# Patient Record
Sex: Female | Born: 1950 | Hispanic: Yes | Marital: Single | Smoking: Former smoker
Health system: Southern US, Community
[De-identification: ages and names within clinical notes are randomized; demographics above are authoritative.]

## PROBLEM LIST (undated history)

## (undated) DIAGNOSIS — I639 Cerebral infarction, unspecified: Secondary | ICD-10-CM

## (undated) DIAGNOSIS — E119 Type 2 diabetes mellitus without complications: Secondary | ICD-10-CM

## (undated) HISTORY — PX: BREAST BIOPSY: SHX20

## (undated) HISTORY — DX: Type 2 diabetes mellitus without complications: E11.9

## (undated) HISTORY — DX: Cerebral infarction, unspecified: I63.9

---

## 2004-01-30 ENCOUNTER — Ambulatory Visit: Payer: Self-pay | Admitting: Pain Medicine

## 2004-02-05 ENCOUNTER — Ambulatory Visit: Payer: Self-pay | Admitting: Pain Medicine

## 2004-10-13 ENCOUNTER — Other Ambulatory Visit: Payer: Self-pay

## 2004-10-13 ENCOUNTER — Observation Stay: Payer: Self-pay | Admitting: Internal Medicine

## 2005-04-22 ENCOUNTER — Ambulatory Visit: Payer: Self-pay | Admitting: Family Medicine

## 2005-04-29 ENCOUNTER — Ambulatory Visit: Payer: Self-pay | Admitting: Family Medicine

## 2005-12-21 ENCOUNTER — Emergency Department: Payer: Self-pay

## 2006-05-18 ENCOUNTER — Ambulatory Visit: Payer: Self-pay

## 2008-04-03 ENCOUNTER — Ambulatory Visit: Payer: Self-pay | Admitting: Pain Medicine

## 2015-05-27 ENCOUNTER — Ambulatory Visit
Admission: RE | Admit: 2015-05-27 | Discharge: 2015-05-27 | Disposition: A | Discharge status: Home / Self Care | Payer: Medicare Other | Source: Ambulatory Visit | Attending: Family Medicine | Admitting: Family Medicine

## 2015-05-27 ENCOUNTER — Other Ambulatory Visit: Payer: Self-pay | Admitting: Family Medicine

## 2015-05-27 DIAGNOSIS — R52 Pain, unspecified: Secondary | ICD-10-CM | POA: Diagnosis present

## 2015-05-27 DIAGNOSIS — M79604 Pain in right leg: Secondary | ICD-10-CM

## 2015-05-27 DIAGNOSIS — M79605 Pain in left leg: Principal | ICD-10-CM

## 2016-08-06 ENCOUNTER — Other Ambulatory Visit: Payer: Self-pay | Admitting: Family Medicine

## 2016-08-06 DIAGNOSIS — Z1231 Encounter for screening mammogram for malignant neoplasm of breast: Secondary | ICD-10-CM

## 2016-09-06 ENCOUNTER — Ambulatory Visit: Payer: Medicare Other | Admitting: Nurse Practitioner

## 2016-09-16 ENCOUNTER — Ambulatory Visit: Payer: Medicare Other | Admitting: Nurse Practitioner

## 2016-09-30 ENCOUNTER — Ambulatory Visit: Payer: Medicare Other | Attending: Nurse Practitioner | Admitting: Nurse Practitioner

## 2016-09-30 ENCOUNTER — Encounter: Payer: Self-pay | Admitting: Nurse Practitioner

## 2016-09-30 DIAGNOSIS — M79605 Pain in left leg: Secondary | ICD-10-CM | POA: Insufficient documentation

## 2016-09-30 DIAGNOSIS — Z8249 Family history of ischemic heart disease and other diseases of the circulatory system: Secondary | ICD-10-CM | POA: Insufficient documentation

## 2016-09-30 DIAGNOSIS — Z8261 Family history of arthritis: Secondary | ICD-10-CM | POA: Insufficient documentation

## 2016-09-30 DIAGNOSIS — M533 Sacrococcygeal disorders, not elsewhere classified: Secondary | ICD-10-CM | POA: Diagnosis not present

## 2016-09-30 DIAGNOSIS — E119 Type 2 diabetes mellitus without complications: Secondary | ICD-10-CM | POA: Diagnosis not present

## 2016-09-30 DIAGNOSIS — M79606 Pain in leg, unspecified: Secondary | ICD-10-CM | POA: Insufficient documentation

## 2016-09-30 DIAGNOSIS — Z823 Family history of stroke: Secondary | ICD-10-CM | POA: Diagnosis not present

## 2016-09-30 DIAGNOSIS — Z833 Family history of diabetes mellitus: Secondary | ICD-10-CM | POA: Diagnosis not present

## 2016-09-30 DIAGNOSIS — M79604 Pain in right leg: Secondary | ICD-10-CM | POA: Diagnosis present

## 2016-09-30 DIAGNOSIS — Z79891 Long term (current) use of opiate analgesic: Secondary | ICD-10-CM | POA: Insufficient documentation

## 2016-09-30 DIAGNOSIS — M792 Neuralgia and neuritis, unspecified: Secondary | ICD-10-CM | POA: Diagnosis not present

## 2016-09-30 DIAGNOSIS — Z79899 Other long term (current) drug therapy: Secondary | ICD-10-CM | POA: Diagnosis not present

## 2016-09-30 DIAGNOSIS — Z87891 Personal history of nicotine dependence: Secondary | ICD-10-CM | POA: Insufficient documentation

## 2016-09-30 DIAGNOSIS — F1190 Opioid use, unspecified, uncomplicated: Secondary | ICD-10-CM | POA: Insufficient documentation

## 2016-09-30 DIAGNOSIS — M545 Low back pain, unspecified: Secondary | ICD-10-CM | POA: Insufficient documentation

## 2016-09-30 DIAGNOSIS — Z7984 Long term (current) use of oral hypoglycemic drugs: Secondary | ICD-10-CM | POA: Diagnosis not present

## 2016-09-30 DIAGNOSIS — G8929 Other chronic pain: Secondary | ICD-10-CM | POA: Diagnosis not present

## 2016-09-30 DIAGNOSIS — G894 Chronic pain syndrome: Secondary | ICD-10-CM | POA: Diagnosis not present

## 2016-09-30 DIAGNOSIS — M542 Cervicalgia: Secondary | ICD-10-CM | POA: Insufficient documentation

## 2016-09-30 MED ORDER — AMITRIPTYLINE HCL 25 MG PO TABS: ORAL_TABLET | ORAL | 0 refills | Status: DC

## 2016-09-30 NOTE — Patient Instructions (Addendum)
____________________________________________________________________________________________  Appointment Policy Summary  It is our goal and responsibility to provide the medical community with assistance in the evaluation and management of patients with chronic pain. Unfortunately our resources are limited. Because we do not have an unlimited amount of time, or available appointments, we are required to closely monitor and manage their use. The following rules exist to maximize their use:  Patient's responsibilities: 1. Punctuality:  At what time should I arrive? You should be physically present in our office 30 minutes before your scheduled appointment. Your scheduled appointment is with your assigned healthcare provider. However, it takes 5-10 minutes to be "checked-in", and another 15 minutes for the nurses to do the admission. If you arrive to our office at the time you were given for your appointment, you will end up being at least 20-25 minutes late to your appointment with the provider. 2. Tardiness:  What happens if I arrive only a few minutes after my scheduled appointment time? You will need to reschedule your appointment. The cutoff is your appointment time. This is why it is so important that you arrive at least 30 minutes before that appointment. If you have an appointment scheduled for 10:00 AM and you arrive at 10:01, you will be required to reschedule your appointment.  3. Plan ahead:  Always assume that you will encounter traffic on your way in. Plan for it. If you are dependent on a driver, make sure they understand these rules and the need to arrive early. 4. Other appointments and responsibilities:  Avoid scheduling any other appointments before or after your pain clinic appointments.  5. Be prepared:  Write down everything that you need to discuss with your healthcare provider and give this information to the admitting nurse. Write down the medications that you will need  refilled. Bring your pills and bottles (even the empty ones), to all of your appointments, except for those where a procedure is scheduled. 6. No children or pets:  Find someone to take care of them. It is not appropriate to bring them in. 7. Scheduling changes:  We request "advanced notification" of any changes or cancellations. 8. Advanced notification:  Defined as a time period of more than 24 hours prior to the originally scheduled appointment. This allows for the appointment to be offered to other patients. 9. Rescheduling:  When a visit is rescheduled, it will require the cancellation of the original appointment. For this reason they both fall within the category of "Cancellations".  10. Cancellations:  They require advanced notification. Any cancellation less than 24 hours before the  appointment will be recorded as a "No Show". 11. No Show:  Defined as an unkept appointment where the patient failed to notify or declare to the practice their intention or inability to keep the appointment.  Corrective process for repeat offenders:  1. Tardiness: Three (3) episodes of rescheduling due to late arrivals will be recorded as one (1) "No Show". 2. Cancellation or reschedule: Three (3) cancellations or rescheduling will be recorded as one (1) "No Show". 3. "No Shows": Three (3) "No Shows" within a 12 month period will result in discharge from the practice.  ____________________________________________________________________________________________  ____________________________________________________________________________________________  Pain Scale  Introduction: The pain score used by this practice is the Verbal Numerical Rating Scale (VNRS-11). This is an 11-point scale. It is for adults and children 10 years or older. There are significant differences in how the pain score is reported, used, and applied. Forget everything you learned in the past and  learn this scoring  system.  General Information: The scale should reflect your current level of pain. Unless you are specifically asked for the level of your worst pain, or your average pain. If you are asked for one of these two, then it should be understood that it is over the past 24 hours.  Basic Activities of Daily Living (ADL): Personal hygiene, dressing, eating, transferring, and using restroom.  Instructions: Most patients tend to report their level of pain as a combination of two factors, their physical pain and their psychosocial pain. This last one is also known as "suffering" and it is reflection of how physical pain affects you socially and psychologically. From now on, report them separately. From this point on, when asked to report your pain level, report only your physical pain. Use the following table for reference.  Pain Clinic Pain Levels (0-5/10)  Pain Level Score  Description  No Pain 0   Mild pain 1 Nagging, annoying, but does not interfere with basic activities of daily living (ADL). Patients are able to eat, bathe, get dressed, toileting (being able to get on and off the toilet and perform personal hygiene functions), transfer (move in and out of bed or a chair without assistance), and maintain continence (able to control bladder and bowel functions). Blood pressure and heart rate are unaffected. A normal heart rate for a healthy adult ranges from 60 to 100 bpm (beats per minute).   Mild to moderate pain 2 Noticeable and distracting. Impossible to hide from other people. More frequent flare-ups. Still possible to adapt and function close to normal. It can be very annoying and may have occasional stronger flare-ups. With discipline, patients may get used to it and adapt.   Moderate pain 3 Interferes significantly with activities of daily living (ADL). It becomes difficult to feed, bathe, get dressed, get on and off the toilet or to perform personal hygiene functions. Difficult to get in and out of  bed or a chair without assistance. Very distracting. With effort, it can be ignored when deeply involved in activities.   Moderately severe pain 4 Impossible to ignore for more than a few minutes. With effort, patients may still be able to manage work or participate in some social activities. Very difficult to concentrate. Signs of autonomic nervous system discharge are evident: dilated pupils (mydriasis); mild sweating (diaphoresis); sleep interference. Heart rate becomes elevated (>115 bpm). Diastolic blood pressure (lower number) rises above 100 mmHg. Patients find relief in laying down and not moving.   Severe pain 5 Intense and extremely unpleasant. Associated with frowning face and frequent crying. Pain overwhelms the senses.  Ability to do any activity or maintain social relationships becomes significantly limited. Conversation becomes difficult. Pacing back and forth is common, as getting into a comfortable position is nearly impossible. Pain wakes you up from deep sleep. Physical signs will be obvious: pupillary dilation; increased sweating; goosebumps; brisk reflexes; cold, clammy hands and feet; nausea, vomiting or dry heaves; loss of appetite; significant sleep disturbance with inability to fall asleep or to remain asleep. When persistent, significant weight loss is observed due to the complete loss of appetite and sleep deprivation.  Blood pressure and heart rate becomes significantly elevated. Caution: If elevated blood pressure triggers a pounding headache associated with blurred vision, then the patient should immediately seek attention at an urgent or emergency care unit, as these may be signs of an impending stroke.    Emergency Department Pain Levels (6-10/10)  Emergency Room Pain 6   Severely limiting. Requires emergency care and should not be seen or managed at an outpatient pain management facility. Communication becomes difficult and requires great effort. Assistance to reach the  emergency department may be required. Facial flushing and profuse sweating along with potentially dangerous increases in heart rate and blood pressure will be evident.   Distressing pain 7 Self-care is very difficult. Assistance is required to transport, or use restroom. Assistance to reach the emergency department will be required. Tasks requiring coordination, such as bathing and getting dressed become very difficult.   Disabling pain 8 Self-care is no longer possible. At this level, pain is disabling. The individual is unable to do even the most "basic" activities such as walking, eating, bathing, dressing, transferring to a bed, or toileting. Fine motor skills are lost. It is difficult to think clearly.   Incapacitating pain 9 Pain becomes incapacitating. Thought processing is no longer possible. Difficult to remember your own name. Control of movement and coordination are lost.   The worst pain imaginable 10 At this level, most patients pass out from pain. When this level is reached, collapse of the autonomic nervous system occurs, leading to a sudden drop in blood pressure and heart rate. This in turn results in a temporary and dramatic drop in blood flow to the brain, leading to a loss of consciousness. Fainting is one of the body's self defense mechanisms. Passing out puts the brain in a calmed state and causes it to shut down for a while, in order to begin the healing process.    Summary: 1. Refer to this scale when providing us with your pain level. 2. Be accurate and careful when reporting your pain level. This will help with your care. 3. Over-reporting your pain level will lead to loss of credibility. 4. Even a level of 1/10 means that there is pain and will be treated at our facility. 5. High, inaccurate reporting will be documented as "Symptom Exaggeration", leading to loss of credibility and suspicions of possible secondary gains such as obtaining more narcotics, or wanting to appear  disabled, for fraudulent reasons. 6. Only pain levels of 5 or below will be seen at our facility. 7. Pain levels of 6 and above will be sent to the Emergency Department and the appointment cancelled. ______________________________________________  Please get your x-rays done as soon as possible.______________________________________________  You were given one prescription for Amitriptyline today.

## 2016-09-30 NOTE — Progress Notes (Signed)
Patient's Name: Meredith Martinez  MRN: [id number]  Referring Provider: Herminio Commons, MD  DOB: 1950-12-28  PCP: Meredith Commons, MD  DOS: 09/30/2016  Note by: Meredith David NP  Service setting: Ambulatory outpatient  Specialty: Interventional Pain Management  Location: [facility] (AMB) Pain Management Facility    Patient type: New Patient    Primary Reason(s) for Visit: Initial Patient Evaluation CC: Leg Pain (both, worse in left leg)  HPI  [name] is a 66 y.o. year old, female patient, who comes today for an initial evaluation. She has Chronic Lower extremity pain  (Secondary Area of Pain) (bilateral) (L>R); Chronic pain syndrome; Chronic neck pain(Primary Area of Pain) (Bilateral) (L>R); Long term current use of opiate analgesic; Long term prescription opiate use; Opiate use; Chronic midline low back pain without sciatica [facility] Area of Pain) (L>R); Neurogenic pain; and Sacroiliac joint pain on her problem list.. Her primarily concern today is the Leg Pain (both, worse in left leg)  Pain Assessment: Location: Right, Left Leg Radiating: n/a Onset: More than a month ago Duration: Chronic pain, Neuropathic pain Quality: Burning, Throbbing (itching) Severity: 7 /10 (self-reported pain score)  Note: Reported level is compatible with observation.                   Effect on ADL:   Timing: Constant Modifying factors: medications  Onset and Duration: Gradual Cause of pain: neuropathy Severity: Getting worse, NAS-11 at its worse: 10/10, NAS-11 at its best: 10/10, NAS-11 now: 7/10 and NAS-11 on the average: 8/10 Timing: Night, During activity or exercise and After a period of immobility Aggravating Factors: Bending, Climbing, Kneeling, Lifiting, Motion, Prolonged standing, Squatting, Stooping , Walking, Walking uphill and Walking downhill Alleviating Factors: Cold packs, Lying down, Medications and Warm showers or baths Associated Problems: Day-time cramps, Night-time cramps,  Fatigue, Numbness, Personality changes, Sadness, Sweating, Swelling, Tingling, Pain that wakes patient up and Pain that does not allow patient to sleep Quality of Pain: Aching, Agonizing, Annoying, Burning, Intermittent, Cruel, Deep, Hot, Itching, Sharp, Shooting, Stabbing, Tender, Throbbing, Tingling and Uncomfortable Previous Examinations or Tests: Biopsy Previous Treatments: Narcotic medications  The patient comes into the clinics today for the first time for a chronic pain management evaluation. According to the patient primary area of pain is in her neck. She admits that this pain is secondary to a fall. She denies any previous surgeries. She did have interventional therapies in the past. She states that they were nerve blocks approximately 2001 and 2. She did not feel they were effective. She did see Dr. Birdie Martinez chiropractor in 2017. She does not feel that this was effective.  Her second area of pain is in her lower extremities. She admits that the left is greater than the right. She describes it as a sharp burning pain. She is having swelling in her lower extremities. She denies previous EMG. She does have uncontrolled diabetes mellitus. She has been encouraged to start insulin therapy however she does not want to do that at this time. She has been checking for ketoacidosis in the past. She has failed gabapentin secondary to allergic reaction (rash). She did not feel that Lyrica was effective however maximum dose was 50 mg 3 times daily.  Her third area of pain is in her lower back. She denies any previous surgeries, interventional therapy or physical therapy..  Today I took the time to provide the patient with information regarding this pain practice. The patient was informed that the practice is divided into two  sections: an interventional pain management section, as well as a completely separate and distinct medication management section. I explained that there are procedure days for  interventional therapies, and evaluation days for follow-ups and medication management. Because of the amount of documentation required during both, they are kept separated. This means that there is the possibility that she may be scheduled for a procedure on one day, and medication management the next. I have also informed her that because of staffing and facility limitations, this practice will no longer take patients for medication management only. To illustrate the reasons for this, I gave the patient the example of surgeons, and how inappropriate it would be to refer a patient to her care, just to write for the post-surgical antibiotics on a surgery done by a different surgeon.   Because interventional pain management is part of the board-certified specialty for the doctors, the patient was informed that joining this practice means that they are open to any and all interventional therapies. I made it clear that this does not mean that they will be forced to have any procedures done. What this means is that I believe interventional therapies to be essential part of the diagnosis and proper management of chronic pain conditions. Therefore, patients not interested in these interventional alternatives will be better served under the care of a different practitioner.  The patient was also made aware of my Comprehensive Pain Management Safety Guidelines where by joining this practice, they limit all of their nerve blocks and joint injections to those done by our practice, for as long as we are retained to manage their care. Historic Controlled Substance Pharmacotherapy Review  PMP and historical list of controlled substances: Lyrica 50 mg, acetaminophen/codeine No. 3, hydrocodone/acetaminophen 5/325 mg, Cheratussin AC, hydrocodone/acetaminophen 5/325 Highest opioid analgesic regimen found: Hydrocodone/acetaminophen 5/325 2 tablets 4 times daily (fill date 01/19/2019) (hydrocodone 40 mg per day) Most recent  opioid analgesic: None Current opioid analgesics: None Highest recorded MME/day: 40 mg/day MME/day: 0 mg/day Medications: The patient did not bring the medication(s) to the appointment, as requested in our "New Patient Package" Pharmacodynamics: Desired effects: Analgesia: The patient reports >50% benefit. Reported improvement in function: The patient reports medication allows her to accomplish basic ADLs. Clinically meaningful improvement in function (CMIF): Sustained CMIF goals met Perceived effectiveness: Described as relatively effective, allowing for increase in activities of daily living (ADL) Undesirable effects: Side-effects or Adverse reactions: None reported Historical Monitoring: The patient  reports that she does not use drugs. List of all UDS Test(s): No results found for: MDMA, COCAINSCRNUR, PCPSCRNUR, PCPQUANT, CANNABQUANT, THCU, [place] List of all Serum Drug Screening Test(s):  No results found for: AMPHSCRSER, BARBSCRSER, BENZOSCRSER, COCAINSCRSER, PCPSCRSER, PCPQUANT, THCSCRSER, CANNABQUANT, OPIATESCRSER, OXYSCRSER, PROPOXSCRSER Historical Background Evaluation: [place] PDMP: Six (6) year initial data search conducted.             [place] Department of public safety, offender search: Editor, commissioning Information) Non-contributory Risk Assessment Profile: Aberrant behavior: None observed or detected today Risk factors for fatal opioid overdose: None identified today Fatal overdose hazard ratio (HR): Calculation deferred Non-fatal overdose hazard ratio (HR): Calculation deferred Risk of opioid abuse or dependence: 0.7-3.0% with doses ? 36 MME/day and 6.1-26% with doses ? 120 MME/day. Substance use disorder (SUD) risk level: Pending results of Medical Psychology Evaluation for SUD Opioid risk tool (ORT) (Total Score): 3  ORT Scoring interpretation table:  Score <3 = Low Risk for SUD  Score between 4-7 = Moderate Risk for SUD  Score >8 =  High Risk for Opioid Abuse   PHQ-2 Depression  Scale:  Total score: 0  PHQ-2 Scoring interpretation table: (Score and probability of major depressive disorder)  Score 0 = No depression  Score 1 = 15.4% Probability  Score 2 = 21.1% Probability  Score 3 = 38.4% Probability  Score 4 = 45.5% Probability  Score 5 = 56.4% Probability  Score 6 = 78.6% Probability   PHQ-9 Depression Scale:  Total score: 0  PHQ-9 Scoring interpretation table:  Score 0-4 = No depression  Score 5-9 = Mild depression  Score 10-14 = Moderate depression  Score 15-19 = Moderately severe depression  Score 20-27 = Severe depression (2.4 times higher risk of SUD and 2.89 times higher risk of overuse)   Pharmacologic Plan: Pending ordered tests and/or consults  Meds  The patient has a current medication list which includes the following prescription(s): acetaminophen, vitamin d-3, lisinopril, loratadine, metformin, montelukast, naproxen sodium, pravastatin, promethazine, vitamin b-12, and amitriptyline.  No current outpatient prescriptions on file prior to visit.   No current facility-administered medications on file prior to visit.    Imaging Review   Lumbosacral Imaging:  Lumbar DG (Complete) 4+V:  Results for orders placed during the hospital encounter of 05/27/15  DG Lumbar Spine Complete   Narrative CLINICAL DATA:  Pain from low back down through both lower extremities for over 1 year worsened over past month, no known injury, pain at low back and at BILATERAL knees, lower legs, and ankles  EXAM: LUMBAR SPINE - COMPLETE 4+ VIEW  COMPARISON:  None  FINDINGS: Osseous demineralization.  Five non-rib-bearing lumbar vertebra.  Vertebral body and disc space heights maintained.  No acute fracture, subluxation or bone destruction.  No spondylolysis.  SI joints symmetric.  IMPRESSION: Osseous demineralization.  No significant lumbar spine abnormalities.   Electronically Signed   By: Lavonia Dana M.D.   On: 05/27/2015 14:24   Note:  Available results from prior imaging studies were reviewed.        ROS  Cardiovascular History: Needs antibiotics prior to dental procedures Pulmonary or Respiratory History: No reported pulmonary signs or symptoms such as wheezing and difficulty taking a deep full breath (Asthma), difficulty blowing air out (Emphysema), coughing up mucus (Bronchitis), persistent dry cough, or temporary stoppage of breathing during sleep Neurological History: No reported neurological signs or symptoms such as seizures, abnormal skin sensations, urinary and/or fecal incontinence, being born with an abnormal open spine and/or a tethered spinal cord Review of Past Neurological Studies: No results found for this or any previous visit. Psychological-Psychiatric History: No reported psychological or psychiatric signs or symptoms such as difficulty sleeping, anxiety, depression, delusions or hallucinations (schizophrenial), mood swings (bipolar disorders) or suicidal ideations or attempts Gastrointestinal History: No reported gastrointestinal signs or symptoms such as vomiting or evacuating blood, reflux, heartburn, alternating episodes of diarrhea and constipation, inflamed or scarred liver, or pancreas or irrregular and/or infrequent bowel movements Genitourinary History: Difficulty emptying the bladder or controlling the flow of urine (Neurogenic bladder) Hematological History: Weakness due to low blood hemoglobin or red blood cell count (Anemia) Endocrine History: High blood sugar controlled without the use of insulin (NIDDM) Rheumatologic History: No reported rheumatological signs and symptoms such as fatigue, joint pain, tenderness, swelling, redness, heat, stiffness, decreased range of motion, with or without associated rash Musculoskeletal History: Negative for myasthenia gravis, muscular dystrophy, multiple sclerosis or malignant hyperthermia Work History: Disabled  Allergies  Ms. Strader has no allergies on  file.  Laboratory Chemistry  Inflammation  Markers Lab Results  Component Value Date   CRP 5.0 (H) 09/30/2016   ESRSEDRATE 13 09/30/2016   (CRP: Acute Phase) (ESR: Chronic Phase) Renal Function Markers Lab Results  Component Value Date   BUN 10 09/30/2016   CREATININE 0.72 09/30/2016   GFRAA 103 09/30/2016   GFRNONAA 89 09/30/2016   Hepatic Function Markers Lab Results  Component Value Date   AST 18 09/30/2016   ALT 20 09/30/2016   ALBUMIN 4.7 09/30/2016   ALKPHOS 118 (H) 09/30/2016   Electrolytes Lab Results  Component Value Date   NA 136 09/30/2016   K 4.3 09/30/2016   CL 96 09/30/2016   CALCIUM 10.3 09/30/2016   MG 2.1 09/30/2016   Neuropathy Markers Lab Results  Component Value Date   VITAMINB12 556 09/30/2016   Bone Pathology Markers Lab Results  Component Value Date   ALKPHOS 118 (H) 09/30/2016   25OHVITD1 WILL FOLLOW 09/30/2016   25OHVITD2 WILL FOLLOW 09/30/2016   25OHVITD3 WILL FOLLOW 09/30/2016   CALCIUM 10.3 09/30/2016   Coagulation Parameters No results found for: INR, LABPROT, APTT, PLT Cardiovascular Markers No results found for: BNP, HGB, HCT Note: Lab results reviewed.  [place]  Drug: Ms. Burdett  reports that she does not use drugs. Alcohol:  reports that she does not drink alcohol. Tobacco:  reports that she quit smoking about 10 years ago. She has never used smokeless tobacco. Medical:  has a past medical history of Diabetes mellitus without complication ([place]). Family: family history includes Arthritis in her mother; Cancer in her mother; Diabetes in her father and mother; Heart disease in her father and mother; Stroke in her father.  No past surgical history on file. Active Ambulatory Problems    Diagnosis Date Noted  . Chronic Lower extremity pain  (Secondary Area of Pain) (bilateral) (L>R) 09/30/2016  . Chronic pain syndrome 09/30/2016  . Chronic neck pain(Primary Area of Pain) (Bilateral) (L>R) 09/30/2016  . Long term current use  of opiate analgesic 09/30/2016  . Long term prescription opiate use 09/30/2016  . Opiate use 09/30/2016  . Chronic midline low back pain without sciatica [facility] Area of Pain) (L>R) 09/30/2016  . Neurogenic pain 09/30/2016  . Sacroiliac joint pain 09/30/2016   Resolved Ambulatory Problems    Diagnosis Date Noted  . No Resolved Ambulatory Problems   Past Medical History:  Diagnosis Date  . Diabetes mellitus without complication ([place])    Constitutional Exam  General appearance: Well nourished, well developed, and well hydrated. In no apparent acute distress Vitals:   09/30/16 1331  BP: (!) 138/59  Pulse: 77  Resp: 16  Temp: 98.6 F (37 C)  TempSrc: Oral  SpO2: 99%  Weight: 130 lb (59 kg)  Height: _0  (1.499 m)   BMI Assessment: Estimated body mass index is 26.26 kg/m as calculated from the following:   Height as of this encounter: _1  (1.499 m).   Weight as of this encounter: 130 lb (59 kg).  BMI interpretation table: BMI level Category Range association with higher incidence of chronic pain  <18 kg/m2 Underweight   18.5-24.9 kg/m2 Ideal body weight   25-29.9 kg/m2 Overweight Increased incidence by 20%  30-34.9 kg/m2 Obese (Class I) Increased incidence by 68%  35-39.9 kg/m2 Severe obesity (Class II) Increased incidence by 136%  >40 kg/m2 Extreme obesity (Class III) Increased incidence by 254%   BMI Readings from Last 4 Encounters:  09/30/16 26.26 kg/m   Wt Readings from Last 4 Encounters:  09/30/16 130 lb (  59 kg)  Psych/Mental status: Alert, oriented x 3 (person, place, & time)       Eyes: PERLA Respiratory: No evidence of acute respiratory distress  Cervical Spine Exam  Inspection: No masses, redness, or swelling Alignment: Symmetrical Functional ROM: Unrestricted ROM      Stability: No instability detected Muscle strength & Tone: Functionally intact Sensory: Unimpaired Palpation: Complains of area being tender to palpation              Upper  Extremity (UE) Exam    Side: Right upper extremity  Side: Left upper extremity  Inspection: Atrophy contracture of right hand  Inspection: No masses, redness, swelling, or asymmetry. No contractures left trigger finger 3, 4 digit  Functional ROM: Unrestricted ROM          Functional ROM: Unrestricted ROM          Muscle strength & Tone: Functionally intact  Muscle strength & Tone: Functionally intact  Sensory: Unimpaired  Sensory: Unimpaired  Palpation: No palpable anomalies              Palpation: No palpable anomalies              Specialized Test(s): Deferred         Specialized Test(s): Deferred          Thoracic Spine Exam  Inspection: No masses, redness, or swelling Alignment: Symmetrical Functional ROM: Unrestricted ROM Stability: No instability detected Sensory: Unimpaired Muscle strength & Tone: No palpable anomalies  Lumbar Spine Exam  Inspection: No masses, redness, or swelling Alignment: Symmetrical Functional ROM: Unrestricted ROM      Stability: No instability detected Muscle strength & Tone: Functionally intact Sensory: Unimpaired Palpation: Complains of area being tender to palpation       Provocative Tests: Lumbar Hyperextension and rotation test: Negative       Patrick's Maneuver: Positive for bilateral S-I arthralgia              Gait & Posture Assessment  Ambulation: Unassisted Gait: Relatively normal for age and body habitus Posture: WNL   Lower Extremity Exam    Side: Right lower extremity  Side: Left lower extremity  Inspection: Edema  Inspection: Edema flatten erythematous rash below the knee  Functional ROM: Unrestricted ROM          Functional ROM: Unrestricted ROM          Muscle strength & Tone: Functionally intact  Muscle strength & Tone: Functionally intact  Sensory: Unimpaired  Sensory: Unimpaired  Palpation: Tender  Palpation: Tender   Assessment  Primary Diagnosis & Pertinent Problem List: Diagnoses of Chronic neck pain, Chronic midline low  back pain without sciatica, Neurogenic pain, Pain in both lower extremities, Sacroiliac joint pain, Chronic pain syndrome, and Long term prescription opiate use were pertinent to this visit.  Visit Diagnosis: 1. Chronic neck pain   2. Chronic midline low back pain without sciatica   3. Neurogenic pain   4. Pain in both lower extremities   5. Sacroiliac joint pain   6. Chronic pain syndrome   7. Long term prescription opiate use    Plan of Care  Initial treatment plan:  Please be advised that as per protocol, today's visit has been an evaluation only. We have not taken over the patient's controlled substance management.  Problem-specific plan: No problem-specific Assessment & Plan notes found for this encounter.  Ordered Lab-work, Procedure(s), Referral(s), & Consult(s): Orders Placed This Encounter  Procedures  . DG Cervical Spine Complete  .  DG Lumbar Spine Complete W/Bend  . DG Si Joints  . Compliance Drug Analysis, Ur  . C-reactive protein  . Sedimentation rate  . Comprehensive metabolic panel  . Magnesium  . 25-Hydroxyvitamin D Lcms D2+D3  . Vitamin B12  . Ambulatory referral to Psychology   Pharmacotherapy: Medications ordered:  Meds ordered this encounter  Medications  . amitriptyline (ELAVIL) 25 MG tablet    Sig: At bedtime    Dispense:  30 tablet    Refill:  0    Order Specific Question:   Supervising Provider    AnswerMilinda Pointer [phone]   Medications administered during this visit: Ms. Gundy had no medications administered during this visit.   Pharmacotherapy under consideration:  Opioid Analgesics: The patient was informed that there is no guarantee that she would be a candidate for opioid analgesics. The decision will be made following CDC guidelines. This decision will be based on the results of diagnostic studies, as well as Ms. Don's risk profile.  Membrane stabilizer: To be determined at a later time Muscle relaxant: To be  determined at a later time NSAID: To be determined at a later time Other analgesic(s): To be determined at a later time   Interventional therapies under consideration: Ms. Rath was informed that there is no guarantee that she would be a candidate for interventional therapies. The decision will be based on the results of diagnostic studies, as well as Ms. Eide's risk profile.  Possible procedure(s): Cervical spinal cord stimulator trial Diagnostic bilateral cervical epidural steroid injection Diagnostic bilateral cervical facet block Cervical radiofrequency ablation Diagnostic bilateral lumbar epidural steroid injection Diagnostic bilateral lumbar facet block Lumbar radiofrequency ablation Sacroiliac joint injection    Provider-requested follow-up: Return for 2nd Visit, w/ Dr. Dossie Arbour, after [facility] eval.  No future appointments.  Primary Care Physician: Meredith Commons, MD Location: [facility] Outpatient Pain Management Facility Note by:  Date: 09/30/2016; Time: 11:31 AM  Pain Score Disclaimer: We use the NRS-11 scale. This is a self-reported, subjective measurement of pain severity with only modest accuracy. It is used primarily to identify changes within a particular patient. It must be understood that outpatient pain scales are significantly less accurate that those used for research, where they can be applied under ideal controlled circumstances with minimal exposure to variables. In reality, the score is likely to be a combination of pain intensity and pain affect, where pain affect describes the degree of emotional arousal or changes in action readiness caused by the sensory experience of pain. Factors such as social and work situation, setting, emotional state, anxiety levels, expectation, and prior pain experience may influence pain perception and show large inter-individual differences that may also be affected by time variables.  Patient instructions provided during this  appointment: Patient Instructions    ____________________________________________________________________________________________  Appointment Policy Summary  It is our goal and responsibility to provide the medical community with assistance in the evaluation and management of patients with chronic pain. Unfortunately our resources are limited. Because we do not have an unlimited amount of time, or available appointments, we are required to closely monitor and manage their use. The following rules exist to maximize their use:  Patient's responsibilities: 1. Punctuality:  At what time should I arrive? You should be physically present in our office 30 minutes before your scheduled appointment. Your scheduled appointment is with your assigned healthcare provider. However, it takes 5-10 minutes to be "checked-in", and another 15 minutes for the nurses to do the admission. If  you arrive to our office at the time you were given for your appointment, you will end up being at least 20-25 minutes late to your appointment with the provider. 2. Tardiness:  What happens if I arrive only a few minutes after my scheduled appointment time? You will need to reschedule your appointment. The cutoff is your appointment time. This is why it is so important that you arrive at least 30 minutes before that appointment. If you have an appointment scheduled for 10:00 AM and you arrive at 10:01, you will be required to reschedule your appointment.  3. Plan ahead:  Always assume that you will encounter traffic on your way in. Plan for it. If you are dependent on a driver, make sure they understand these rules and the need to arrive early. 4. Other appointments and responsibilities:  Avoid scheduling any other appointments before or after your pain clinic appointments.  5. Be prepared:  Write down everything that you need to discuss with your healthcare provider and give this information to the admitting nurse. Write down  the medications that you will need refilled. Bring your pills and bottles (even the empty ones), to all of your appointments, except for those where a procedure is scheduled. 6. No children or pets:  Find someone to take care of them. It is not appropriate to bring them in. 7. Scheduling changes:  We request "advanced notification" of any changes or cancellations. 8. Advanced notification:  Defined as a time period of more than 24 hours prior to the originally scheduled appointment. This allows for the appointment to be offered to other patients. 9. Rescheduling:  When a visit is rescheduled, it will require the cancellation of the original appointment. For this reason they both fall within the category of "Cancellations".  10. Cancellations:  They require advanced notification. Any cancellation less than 24 hours before the  appointment will be recorded as a "No Show". 11. No Show:  Defined as an unkept appointment where the patient failed to notify or declare to the practice their intention or inability to keep the appointment.  Corrective process for repeat offenders:  1. Tardiness: Three (3) episodes of rescheduling due to late arrivals will be recorded as one (1) "No Show". 2. Cancellation or reschedule: Three (3) cancellations or rescheduling will be recorded as one (1) "No Show". 3. "No Shows": Three (3) "No Shows" within a 12 month period will result in discharge from the practice.  ____________________________________________________________________________________________  ____________________________________________________________________________________________  Pain Scale  Introduction: The pain score used by this practice is the Verbal Numerical Rating Scale (VNRS-11). This is an 11-point scale. It is for adults and children 10 years or older. There are significant differences in how the pain score is reported, used, and applied. Forget everything you learned in the past and  learn this scoring system.  General Information: The scale should reflect your current level of pain. Unless you are specifically asked for the level of your worst pain, or your average pain. If you are asked for one of these two, then it should be understood that it is over the past 24 hours.  Basic Activities of Daily Living (ADL): Personal hygiene, dressing, eating, transferring, and using restroom.  Instructions: Most patients tend to report their level of pain as a combination of two factors, their physical pain and their psychosocial pain. This last one is also known as "suffering" and it is reflection of how physical pain affects you socially and psychologically. From now on, report them  separately. From this point on, when asked to report your pain level, report only your physical pain. Use the following table for reference.  Pain Clinic Pain Levels (0-5/10)  Pain Level Score  Description  No Pain 0   Mild pain 1 Nagging, annoying, but does not interfere with basic activities of daily living (ADL). Patients are able to eat, bathe, get dressed, toileting (being able to get on and off the toilet and perform personal hygiene functions), transfer (move in and out of bed or a chair without assistance), and maintain continence (able to control bladder and bowel functions). Blood pressure and heart rate are unaffected. A normal heart rate for a healthy adult ranges from 60 to 100 bpm (beats per minute).   Mild to moderate pain 2 Noticeable and distracting. Impossible to hide from other people. More frequent flare-ups. Still possible to adapt and function close to normal. It can be very annoying and may have occasional stronger flare-ups. With discipline, patients may get used to it and adapt.   Moderate pain 3 Interferes significantly with activities of daily living (ADL). It becomes difficult to feed, bathe, get dressed, get on and off the toilet or to perform personal hygiene functions. Difficult  to get in and out of bed or a chair without assistance. Very distracting. With effort, it can be ignored when deeply involved in activities.   Moderately severe pain 4 Impossible to ignore for more than a few minutes. With effort, patients may still be able to manage work or participate in some social activities. Very difficult to concentrate. Signs of autonomic nervous system discharge are evident: dilated pupils (mydriasis); mild sweating (diaphoresis); sleep interference. Heart rate becomes elevated (>115 bpm). Diastolic blood pressure (lower number) rises above 100 mmHg. Patients find relief in laying down and not moving.   Severe pain 5 Intense and extremely unpleasant. Associated with frowning face and frequent crying. Pain overwhelms the senses.  Ability to do any activity or maintain social relationships becomes significantly limited. Conversation becomes difficult. Pacing back and forth is common, as getting into a comfortable position is nearly impossible. Pain wakes you up from deep sleep. Physical signs will be obvious: pupillary dilation; increased sweating; goosebumps; brisk reflexes; cold, clammy hands and feet; nausea, vomiting or dry heaves; loss of appetite; significant sleep disturbance with inability to fall asleep or to remain asleep. When persistent, significant weight loss is observed due to the complete loss of appetite and sleep deprivation.  Blood pressure and heart rate becomes significantly elevated. Caution: If elevated blood pressure triggers a pounding headache associated with blurred vision, then the patient should immediately seek attention at an urgent or emergency care unit, as these may be signs of an impending stroke.    Emergency Department Pain Levels (6-10/10)  Emergency Room Pain 6 Severely limiting. Requires emergency care and should not be seen or managed at an outpatient pain management facility. Communication becomes difficult and requires great effort.  Assistance to reach the emergency department may be required. Facial flushing and profuse sweating along with potentially dangerous increases in heart rate and blood pressure will be evident.   Distressing pain 7 Self-care is very difficult. Assistance is required to transport, or use restroom. Assistance to reach the emergency department will be required. Tasks requiring coordination, such as bathing and getting dressed become very difficult.   Disabling pain 8 Self-care is no longer possible. At this level, pain is disabling. The individual is unable to do even the most "basic" activities such  as walking, eating, bathing, dressing, transferring to a bed, or toileting. Fine motor skills are lost. It is difficult to think clearly.   Incapacitating pain 9 Pain becomes incapacitating. Thought processing is no longer possible. Difficult to remember your own name. Control of movement and coordination are lost.   The worst pain imaginable 10 At this level, most patients pass out from pain. When this level is reached, collapse of the autonomic nervous system occurs, leading to a sudden drop in blood pressure and heart rate. This in turn results in a temporary and dramatic drop in blood flow to the brain, leading to a loss of consciousness. Fainting is one of the body's self defense mechanisms. Passing out puts the brain in a calmed state and causes it to shut down for a while, in order to begin the healing process.    Summary: 1. Refer to this scale when providing [place] with your pain level. 2. Be accurate and careful when reporting your pain level. This will help with your care. 3. Over-reporting your pain level will lead to loss of credibility. 4. Even a level of 1/10 means that there is pain and will be treated at our facility. 5. High, inaccurate reporting will be documented as "Symptom Exaggeration", leading to loss of credibility and suspicions of possible secondary gains such as obtaining more  narcotics, or wanting to appear disabled, for fraudulent reasons. 6. Only pain levels of 5 or below will be seen at our facility. 7. Pain levels of 6 and above will be sent to the Emergency Department and the appointment cancelled. ______________________________________________  Please get your x-rays done as soon as possible.______________________________________________  You were given one prescription for Amitriptyline today.

## 2016-09-30 NOTE — Progress Notes (Signed)
Safety precautions to be maintained throughout the outpatient stay will include: orient to surroundings, keep bed in low position, maintain call bell within reach at all times, provide assistance with transfer out of bed and ambulation.  

## 2016-10-04 ENCOUNTER — Ambulatory Visit
Admission: RE | Admit: 2016-10-04 | Discharge: 2016-10-04 | Disposition: A | Discharge status: Home / Self Care | Payer: Medicare Other | Source: Ambulatory Visit | Attending: Nurse Practitioner | Admitting: Nurse Practitioner

## 2016-10-04 ENCOUNTER — Ambulatory Visit
Admission: RE | Admit: 2016-10-04 | Discharge: 2016-10-04 | Disposition: A | Discharge status: Home / Self Care | Payer: Medicare Other | Source: Ambulatory Visit | Attending: Pain Medicine | Admitting: Pain Medicine

## 2016-10-04 DIAGNOSIS — G8929 Other chronic pain: Secondary | ICD-10-CM | POA: Insufficient documentation

## 2016-10-04 DIAGNOSIS — M542 Cervicalgia: Principal | ICD-10-CM

## 2016-10-04 DIAGNOSIS — M533 Sacrococcygeal disorders, not elsewhere classified: Secondary | ICD-10-CM | POA: Insufficient documentation

## 2016-10-04 DIAGNOSIS — M545 Low back pain, unspecified: Secondary | ICD-10-CM

## 2016-10-04 LAB — COMPLIANCE DRUG ANALYSIS, UR

## 2016-10-06 ENCOUNTER — Encounter: Payer: Self-pay | Admitting: Nurse Practitioner

## 2016-10-07 LAB — 25-HYDROXY VITAMIN D LCMS D2+D3
25-Hydroxy, Vitamin D-3: 14 ng/mL
25-Hydroxy, Vitamin D: 23 ng/mL — ABNORMAL LOW

## 2016-10-07 LAB — VITAMIN B12: VITAMIN B 12: 556 pg/mL (ref 232–1245)

## 2016-10-07 LAB — COMPREHENSIVE METABOLIC PANEL
A/G RATIO: 1.4 (ref 1.2–2.2)
ALT: 20 IU/L (ref 0–32)
AST: 18 IU/L (ref 0–40)
Albumin: 4.7 g/dL (ref 3.6–4.8)
Alkaline Phosphatase: 118 IU/L — ABNORMAL HIGH (ref 39–117)
BUN/Creatinine Ratio: 14 (ref 12–28)
BUN: 10 mg/dL (ref 8–27)
Bilirubin Total: 0.4 mg/dL (ref 0.0–1.2)
CALCIUM: 10.3 mg/dL (ref 8.7–10.3)
CO2: 24 mmol/L (ref 20–29)
Chloride: 96 mmol/L (ref 96–106)
Creatinine, Ser: 0.72 mg/dL (ref 0.57–1.00)
GFR, EST AFRICAN AMERICAN: 103 mL/min/{1.73_m2} (ref >59)
GFR, EST NON AFRICAN AMERICAN: 89 mL/min/{1.73_m2} (ref >59)
GLOBULIN, TOTAL: 3.3 g/dL (ref 1.5–4.5)
Glucose: 409 mg/dL — ABNORMAL HIGH (ref 65–99)
POTASSIUM: 4.3 mmol/L (ref 3.5–5.2)
SODIUM: 136 mmol/L (ref 134–144)
TOTAL PROTEIN: 8 g/dL (ref 6.0–8.5)

## 2016-10-07 LAB — 25-HYDROXYVITAMIN D LCMS D2+D3: 25-HYDROXY, VITAMIN D-2: 8.7 ng/mL

## 2016-10-07 LAB — C-REACTIVE PROTEIN: CRP: 5 mg/L — ABNORMAL HIGH (ref 0.0–4.9)

## 2016-10-07 LAB — SEDIMENTATION RATE: Sed Rate: 13 mm/hr (ref 0–40)

## 2016-10-07 LAB — MAGNESIUM: Magnesium: 2.1 mg/dL (ref 1.6–2.3)

## 2016-12-29 ENCOUNTER — Ambulatory Visit
Admission: RE | Admit: 2016-12-29 | Discharge: 2016-12-29 | Disposition: A | Discharge status: Home / Self Care | Payer: Medicare Other | Source: Ambulatory Visit | Attending: Family Medicine | Admitting: Family Medicine

## 2016-12-29 DIAGNOSIS — Z1231 Encounter for screening mammogram for malignant neoplasm of breast: Secondary | ICD-10-CM | POA: Diagnosis present

## 2017-02-07 ENCOUNTER — Other Ambulatory Visit: Payer: Self-pay | Admitting: Family Medicine

## 2017-02-07 DIAGNOSIS — R928 Other abnormal and inconclusive findings on diagnostic imaging of breast: Secondary | ICD-10-CM

## 2017-02-07 DIAGNOSIS — N6320 Unspecified lump in the left breast, unspecified quadrant: Secondary | ICD-10-CM

## 2017-06-25 ENCOUNTER — Other Ambulatory Visit: Payer: Self-pay

## 2017-06-25 ENCOUNTER — Inpatient Hospital Stay
Admission: EM | Admit: 2017-06-25 | Discharge: 2017-06-27 | DRG: 392 | Disposition: A | Discharge status: Home / Self Care | Payer: Medicare Other | Attending: Internal Medicine | Admitting: Internal Medicine

## 2017-06-25 ENCOUNTER — Inpatient Hospital Stay: Payer: Medicare Other

## 2017-06-25 DIAGNOSIS — Z79899 Other long term (current) drug therapy: Secondary | ICD-10-CM | POA: Diagnosis not present

## 2017-06-25 DIAGNOSIS — R933 Abnormal findings on diagnostic imaging of other parts of digestive tract: Secondary | ICD-10-CM | POA: Diagnosis present

## 2017-06-25 DIAGNOSIS — E876 Hypokalemia: Secondary | ICD-10-CM | POA: Diagnosis present

## 2017-06-25 DIAGNOSIS — K3184 Gastroparesis: Secondary | ICD-10-CM | POA: Diagnosis present

## 2017-06-25 DIAGNOSIS — Z7984 Long term (current) use of oral hypoglycemic drugs: Secondary | ICD-10-CM

## 2017-06-25 DIAGNOSIS — K2900 Acute gastritis without bleeding: Secondary | ICD-10-CM | POA: Diagnosis present

## 2017-06-25 DIAGNOSIS — Z87891 Personal history of nicotine dependence: Secondary | ICD-10-CM | POA: Diagnosis not present

## 2017-06-25 DIAGNOSIS — Z791 Long term (current) use of non-steroidal anti-inflammatories (NSAID): Secondary | ICD-10-CM

## 2017-06-25 DIAGNOSIS — Z880 Allergy status to penicillin: Secondary | ICD-10-CM | POA: Diagnosis not present

## 2017-06-25 DIAGNOSIS — K529 Noninfective gastroenteritis and colitis, unspecified: Principal | ICD-10-CM | POA: Diagnosis present

## 2017-06-25 DIAGNOSIS — I10 Essential (primary) hypertension: Secondary | ICD-10-CM | POA: Diagnosis present

## 2017-06-25 DIAGNOSIS — E1165 Type 2 diabetes mellitus with hyperglycemia: Secondary | ICD-10-CM | POA: Diagnosis present

## 2017-06-25 DIAGNOSIS — E1143 Type 2 diabetes mellitus with diabetic autonomic (poly)neuropathy: Secondary | ICD-10-CM | POA: Diagnosis present

## 2017-06-25 DIAGNOSIS — Z91041 Radiographic dye allergy status: Secondary | ICD-10-CM

## 2017-06-25 LAB — CBC
HEMATOCRIT: 39.7 % (ref 35.0–47.0)
HEMOGLOBIN: 13.6 g/dL (ref 12.0–16.0)
MCH: 29.5 pg (ref 26.0–34.0)
MCHC: 34.4 g/dL (ref 32.0–36.0)
MCV: 85.9 fL (ref 80.0–100.0)
Platelets: 385 10*3/uL (ref 150–440)
RBC: 4.62 MIL/uL (ref 3.80–5.20)
RDW: 12.4 % (ref 11.5–14.5)
WBC: 7.9 10*3/uL (ref 3.6–11.0)

## 2017-06-25 LAB — COMPREHENSIVE METABOLIC PANEL
ALT: 21 U/L (ref 14–54)
ANION GAP: 14 (ref 5–15)
AST: 23 U/L (ref 15–41)
Albumin: 4 g/dL (ref 3.5–5.0)
Alkaline Phosphatase: 95 U/L (ref 38–126)
BILIRUBIN TOTAL: 0.9 mg/dL (ref 0.3–1.2)
BUN: 15 mg/dL (ref 6–20)
CALCIUM: 9.2 mg/dL (ref 8.9–10.3)
CO2: 21 mmol/L — ABNORMAL LOW (ref 22–32)
Chloride: 99 mmol/L — ABNORMAL LOW (ref 101–111)
Creatinine, Ser: 0.64 mg/dL (ref 0.44–1.00)
Glucose, Bld: 232 mg/dL — ABNORMAL HIGH (ref 65–99)
POTASSIUM: 3.4 mmol/L — AB (ref 3.5–5.1)
Sodium: 134 mmol/L — ABNORMAL LOW (ref 135–145)
TOTAL PROTEIN: 8.5 g/dL — AB (ref 6.5–8.1)

## 2017-06-25 LAB — LIPASE, BLOOD: Lipase: 51 U/L (ref 11–51)

## 2017-06-25 LAB — GLUCOSE, CAPILLARY
GLUCOSE-CAPILLARY: 153 mg/dL — AB (ref 65–99)
Glucose-Capillary: 199 mg/dL — ABNORMAL HIGH (ref 65–99)

## 2017-06-25 MED ORDER — FAMOTIDINE IN NACL 20-0.9 MG/50ML-% IV SOLN
20.0000 mg | Freq: Two times a day (BID) | INTRAVENOUS | Status: DC
  Administered 2017-06-25 – 2017-06-27 (×5): 20 mg via INTRAVENOUS
  Filled 2017-06-25 (×5): qty 50

## 2017-06-25 MED ORDER — ONDANSETRON HCL 4 MG/2ML IJ SOLN
4.0000 mg | Freq: Four times a day (QID) | INTRAMUSCULAR | Status: DC | PRN
  Administered 2017-06-25 – 2017-06-26 (×2): 4 mg via INTRAVENOUS
  Filled 2017-06-25 (×2): qty 2

## 2017-06-25 MED ORDER — MORPHINE SULFATE (PF) 4 MG/ML IV SOLN
INTRAVENOUS | Status: AC
  Administered 2017-06-25: 4 mg via INTRAVENOUS
  Filled 2017-06-25: qty 1

## 2017-06-25 MED ORDER — METOCLOPRAMIDE HCL 5 MG/ML IJ SOLN
10.0000 mg | INTRAMUSCULAR | Status: AC
  Administered 2017-06-25: 10 mg via INTRAVENOUS

## 2017-06-25 MED ORDER — BARIUM SULFATE 2.1 % PO SUSP
450.0000 mL | ORAL | Status: AC
Start: 2017-06-25 — End: 2017-06-25
  Administered 2017-06-25 (×2): 450 mL via ORAL

## 2017-06-25 MED ORDER — POTASSIUM CHLORIDE IN NACL 20-0.9 MEQ/L-% IV SOLN
INTRAVENOUS | Status: DC
  Administered 2017-06-25 – 2017-06-27 (×4): via INTRAVENOUS
  Filled 2017-06-25 (×8): qty 1000

## 2017-06-25 MED ORDER — ACETAMINOPHEN 650 MG RE SUPP
650.0000 mg | Freq: Four times a day (QID) | RECTAL | Status: DC | PRN
  Administered 2017-06-25: 650 mg via RECTAL
  Filled 2017-06-25: qty 1

## 2017-06-25 MED ORDER — INSULIN ASPART 100 UNIT/ML ~~LOC~~ SOLN: 0.0000 [IU] | Freq: Every day | SUBCUTANEOUS | Status: DC

## 2017-06-25 MED ORDER — ENOXAPARIN SODIUM 40 MG/0.4ML ~~LOC~~ SOLN
40.0000 mg | SUBCUTANEOUS | Status: DC
  Administered 2017-06-25 – 2017-06-26 (×2): 40 mg via SUBCUTANEOUS
  Filled 2017-06-25 (×2): qty 0.4

## 2017-06-25 MED ORDER — ONDANSETRON HCL 4 MG PO TABS: 4.0000 mg | ORAL_TABLET | Freq: Four times a day (QID) | ORAL | Status: DC | PRN

## 2017-06-25 MED ORDER — ONDANSETRON HCL 4 MG/2ML IJ SOLN
4.0000 mg | Freq: Once | INTRAMUSCULAR | Status: AC
  Administered 2017-06-25: 4 mg via INTRAVENOUS
  Filled 2017-06-25: qty 2

## 2017-06-25 MED ORDER — INSULIN ASPART 100 UNIT/ML ~~LOC~~ SOLN
0.0000 [IU] | Freq: Three times a day (TID) | SUBCUTANEOUS | Status: DC
  Administered 2017-06-25: 2 [IU] via SUBCUTANEOUS
  Administered 2017-06-26 (×2): 1 [IU] via SUBCUTANEOUS
  Filled 2017-06-25 (×3): qty 1

## 2017-06-25 MED ORDER — OXYCODONE HCL 5 MG PO TABS
5.0000 mg | ORAL_TABLET | ORAL | Status: DC | PRN
  Filled 2017-06-25: qty 1

## 2017-06-25 MED ORDER — METOCLOPRAMIDE HCL 5 MG/ML IJ SOLN
INTRAMUSCULAR | Status: AC
Start: 2017-06-25 — End: 2017-06-26
  Filled 2017-06-25: qty 2

## 2017-06-25 MED ORDER — GI COCKTAIL ~~LOC~~
30.0000 mL | Freq: Once | ORAL | Status: AC
  Administered 2017-06-25: 30 mL via ORAL
  Filled 2017-06-25: qty 30

## 2017-06-25 MED ORDER — METOCLOPRAMIDE HCL 5 MG/ML IJ SOLN
5.0000 mg | Freq: Four times a day (QID) | INTRAMUSCULAR | Status: DC
  Administered 2017-06-25 – 2017-06-26 (×6): 5 mg via INTRAVENOUS
  Filled 2017-06-25 (×6): qty 2

## 2017-06-25 MED ORDER — ACETAMINOPHEN 325 MG PO TABS: 650.0000 mg | ORAL_TABLET | Freq: Four times a day (QID) | ORAL | Status: DC | PRN

## 2017-06-25 MED ORDER — SODIUM CHLORIDE 0.9 % IV SOLN
1000.0000 mL | Freq: Once | INTRAVENOUS | Status: AC
  Administered 2017-06-25: 1000 mL via INTRAVENOUS

## 2017-06-25 MED ORDER — POLYETHYLENE GLYCOL 3350 17 G PO PACK: 17.0000 g | PACK | Freq: Every day | ORAL | Status: DC | PRN

## 2017-06-25 MED ORDER — MORPHINE SULFATE (PF) 4 MG/ML IV SOLN
4.0000 mg | Freq: Once | INTRAVENOUS | Status: AC
  Administered 2017-06-25: 4 mg via INTRAVENOUS

## 2017-06-25 NOTE — ED Notes (Signed)
Patient unable to tolerate first bottle of oral contrast. CT tech aware and requested that we not take the patient to her room until she speaks with the hospitalist.

## 2017-06-25 NOTE — ED Triage Notes (Signed)
Pt arrives to ED [facility] from home for central abd pain and vomiting since Thursday. Pt thought she had a sinus infection that went into her teeth so saw dentist. He took xray and told pt that she has a cyst at L side of nose in sinuses and to see her PCP. PCP started pt on amoxicillin. Pt took 1 pill. Started vomiting Thursday night. C/o central abd pain and vomiting up. Has tried to eat pancakes and oatmeal and pedialyte but unable to keep anything down. Alert, oriented, denies diarrhea or fever.

## 2017-06-25 NOTE — ED Notes (Addendum)
EMS gave  zofran enroute.   Pt states she still has appendix and gallbladder.

## 2017-06-25 NOTE — H&P (Signed)
[facility] [place] [facility]   PATIENT NAME: Meredith Martinez    MR#:  [id number]  DATE OF BIRTH:  June 07, 1950  DATE OF ADMISSION:  06/25/2017  PRIMARY CARE PHYSICIAN: Toy Cookey, FNP   REQUESTING/REFERRING PHYSICIAN: Dr. Cyril Loosen  CHIEF COMPLAINT:   Chief Complaint  Patient presents with  . Abdominal Pain  . Emesis    HISTORY OF PRESENT ILLNESS:  Meredith Martinez  is a 67 y.o. female with a known history of diabetes mellitus presents to the hospital complaining of intractable nausea, vomiting and epigastric pain of 2 days.  Patient initially had a tooth fall off.  Then started having pain developed through this a week back.  Saw her primary care physician and was referred to a dentist.  X-rays were taken of her face where a small cyst was found and patient was started on Augmentin prophylactically although no acute infection was found.  She took 1 dose of Augmentin and after couple hours started having vomiting which has not resolved until now.  No diarrhea or constipation.  No blood in vomiting.  No history of recurrent vomiting or gastroparesis.  No other change in medications.  She has received IV Zofran 4 mg x 2 and 1 dose of morphine.  While I am in the room interviewing her she continues to throw up the little bit of water she tried to drink a few minutes back.  PAST MEDICAL HISTORY:   Past Medical History:  Diagnosis Date  . Diabetes mellitus without complication (HCC)    niddm    PAST SURGICAL HISTORY:   Past Surgical History:  Procedure Laterality Date  . BREAST BIOPSY Right    neg    SOCIAL HISTORY:   Social History   Tobacco Use  . Smoking status: Former Smoker    Last attempt to quit: 2008    Years since quitting: 11.3  . Smokeless tobacco: Never Used  Substance Use Topics  . Alcohol use: No    FAMILY HISTORY:   Family History  Problem Relation Age of Onset  . Heart disease Mother   . Diabetes Mother   . Arthritis  Mother   . Cancer Mother   . Breast cancer Mother 39  . Heart disease Father   . Stroke Father   . Diabetes Father     DRUG ALLERGIES:   Allergies  Allergen Reactions  . Amoxicillin Nausea And Vomiting  . Iodine     REVIEW OF SYSTEMS:   Review of Systems  Constitutional: Positive for malaise/fatigue. Negative for chills and fever.  HENT: Negative for sore throat.   Eyes: Negative for blurred vision, double vision and pain.  Respiratory: Negative for cough, hemoptysis, shortness of breath and wheezing.   Cardiovascular: Negative for chest pain, palpitations, orthopnea and leg swelling.  Gastrointestinal: Positive for abdominal pain, nausea and vomiting. Negative for constipation, diarrhea and heartburn.  Genitourinary: Negative for dysuria and hematuria.  Musculoskeletal: Negative for back pain and joint pain.  Skin: Negative for rash.  Neurological: Negative for sensory change, speech change, focal weakness and headaches.  Endo/Heme/Allergies: Does not bruise/bleed easily.  Psychiatric/Behavioral: Negative for depression. The patient is not nervous/anxious.     MEDICATIONS AT HOME:   Prior to Admission medications   Medication Sig Start Date End Date Taking? Authorizing Provider  acetaminophen (TYLENOL) 500 MG tablet Take 500 mg by mouth 2 (two) times daily. 2 tabs bid    [provider]  amitriptyline (ELAVIL) 25 MG tablet At  bedtime 09/30/16   Barbette Merino, NP  Cholecalciferol (VITAMIN D-3) 1000 units CAPS Take by mouth daily.    [provider]  lisinopril (PRINIVIL,ZESTRIL) 10 MG tablet Take 10 mg by mouth daily.    [provider]  loratadine (CLARITIN) 10 MG tablet Take 10 mg by mouth daily.    [provider]  metFORMIN (GLUCOPHAGE) 500 MG tablet Take 500 mg by mouth 2 (two) times daily with a meal.    [provider]  montelukast (SINGULAIR) 10 MG tablet Take 10 mg by mouth at bedtime.    [provider]   naproxen sodium (ANAPROX) 220 MG tablet Take 220 mg by mouth at bedtime. 2 tabs at night    [provider]  pravastatin (PRAVACHOL) 40 MG tablet Take 40 mg by mouth daily.    [provider]  promethazine (PHENERGAN) 25 MG tablet Take 25 mg by mouth.    [provider]  vitamin B-12 (CYANOCOBALAMIN) 500 MCG tablet Take 500 mcg by mouth daily.    [provider]     VITAL SIGNS:  Blood pressure 133/82, pulse 70, temperature 97.6 F (36.4 C), temperature source Oral, resp. rate 18, height  (1.499 m), weight 60.3 kg (133 lb), SpO2 100 %.  PHYSICAL EXAMINATION:  Physical Exam  GENERAL:  67 y.o.-year-old patient lying in the bed with significant distress due to nausea EYES: Pupils equal, round, reactive to light and accommodation. No scleral icterus. Extraocular muscles intact.  HEENT: Head atraumatic, normocephalic. Oropharynx and nasopharynx clear. No oropharyngeal erythema, moist oral mucosa  NECK:  Supple, no jugular venous distention. No thyroid enlargement, no tenderness.  LUNGS: Normal breath sounds bilaterally, no wheezing, rales, rhonchi. No use of accessory muscles of respiration.  CARDIOVASCULAR: S1, S2 normal. No murmurs, rubs, or gallops.  ABDOMEN: Soft, epigastric tenderness, nondistended. Bowel sounds present. No organomegaly or mass.  EXTREMITIES: No pedal edema, cyanosis, or clubbing. + 2 pedal & radial pulses b/l.   NEUROLOGIC: Cranial nerves II through XII are intact. No focal Motor or sensory deficits appreciated b/l PSYCHIATRIC: The patient is alert and oriented x 3. Good affect.  SKIN: No obvious rash, lesion, or ulcer.   LABORATORY PANEL:   CBC Recent Labs  Lab 06/25/17 0833  WBC 7.9  HGB 13.6  HCT 39.7  PLT 385   ------------------------------------------------------------------------------------------------------------------  Chemistries  Recent Labs  Lab 06/25/17 0833  NA 134*  K 3.4*  CL 99*  CO2 21*   GLUCOSE 232*  BUN 15  CREATININE 0.64  CALCIUM 9.2  AST 23  ALT 21  ALKPHOS 95  BILITOT 0.9   ------------------------------------------------------------------------------------------------------------------  Cardiac Enzymes No results for input(s): TROPONINI in the last 168 hours. ------------------------------------------------------------------------------------------------------------------  RADIOLOGY:  No results found.   IMPRESSION AND PLAN:   *Intractable nausea and vomiting.  Etiology unclear.  Could be gastritis from Augmentin.  No improvement with medications and has abdominal tenderness.  Will get CT scan of the abdomen and pelvis without IV contrast as patient has iodine allergy. Could be gastroparesis.  Will start scheduled Reglan.  Will also started on Protonix.  Zofran as needed.  Further management as per CT scan results and response to treatment. Clear liquid diet  *Diabetes mellitus.  Sliding scale insulin.  *DVT prophylaxis with Lovenox  All the records are reviewed and case discussed with ED provider. Management plans discussed with the patient, family and they are in agreement.  CODE STATUS: Full code  TOTAL TIME TAKING CARE OF  THIS PATIENT: 40 minutes.   Molinda Bailiff Toran Murch M.D on 06/25/2017 at 2:10 PM  Between 7am to 6pm - Pager - [phone]  After 6pm go to [email or web address] - password [email or web address]  [facility]  Office  [phone]  CC: Primary care physician; Toy Cookey, FNP  Note: This dictation was prepared with Dragon dictation along with smaller phrase technology. Any transcriptional errors that result from this process are unintentional.

## 2017-06-25 NOTE — ED Notes (Signed)
Patient is tolerating well crackers, water and red jello that her family member brought to her. Patient still c/o upper abdominal pain. Dr. Cyril Loosen is aware.

## 2017-06-25 NOTE — ED Provider Notes (Signed)
[facility] Emergency Department Provider Note   ____________________________________________    I have reviewed the triage vital signs and the nursing notes.   HISTORY  Chief Complaint Abdominal Pain and Emesis     HPI Meredith Martinez is a 67 y.o. female with a history of diabetes who presents with complaints of abdominal pain, nausea and vomiting.  Patient reports she was prescribed Augmentin for sinusitis, took the first pill on Thursday around 4 PM, later that evening developed significant nausea and vomiting.  Reports vomiting throughout the night, developed epigastric burning sensation as well.  Continues to feel nauseated.  No diarrhea.  No myalgias.  No fevers or chills or sick contacts.  Has not taken anything for this.   Past Medical History:  Diagnosis Date  . Diabetes mellitus without complication (HCC)    niddm    Patient Active Problem List   Diagnosis Date Noted  . Chronic Lower extremity pain  (Secondary Area of Pain) (bilateral) (L>R) 09/30/2016  . Chronic pain syndrome 09/30/2016  . Chronic neck pain(Primary Area of Pain) (Bilateral) (L>R) 09/30/2016  . Long term current use of opiate analgesic 09/30/2016  . Long term prescription opiate use 09/30/2016  . Opiate use 09/30/2016  . Chronic midline low back pain without sciatica [facility] Area of Pain) (L>R) 09/30/2016  . Neurogenic pain 09/30/2016  . Sacroiliac joint pain 09/30/2016    Past Surgical History:  Procedure Laterality Date  . BREAST BIOPSY Right    neg    Prior to Admission medications   Medication Sig Start Date End Date Taking? Authorizing Provider  acetaminophen (TYLENOL) 500 MG tablet Take 500 mg by mouth 2 (two) times daily. 2 tabs bid    [provider]  amitriptyline (ELAVIL) 25 MG tablet At bedtime 09/30/16   Barbette Merino, NP  Cholecalciferol (VITAMIN D-3) 1000 units CAPS Take by mouth daily.    [provider]  lisinopril  (PRINIVIL,ZESTRIL) 10 MG tablet Take 10 mg by mouth daily.    [provider]  loratadine (CLARITIN) 10 MG tablet Take 10 mg by mouth daily.    [provider]  metFORMIN (GLUCOPHAGE) 500 MG tablet Take 500 mg by mouth 2 (two) times daily with a meal.    [provider]  montelukast (SINGULAIR) 10 MG tablet Take 10 mg by mouth at bedtime.    [provider]  naproxen sodium (ANAPROX) 220 MG tablet Take 220 mg by mouth at bedtime. 2 tabs at night    [provider]  pravastatin (PRAVACHOL) 40 MG tablet Take 40 mg by mouth daily.    [provider]  promethazine (PHENERGAN) 25 MG tablet Take 25 mg by mouth.    [provider]  vitamin B-12 (CYANOCOBALAMIN) 500 MCG tablet Take 500 mcg by mouth daily.    [provider]     Allergies Amoxicillin and Iodine  Family History  Problem Relation Age of Onset  . Heart disease Mother   . Diabetes Mother   . Arthritis Mother   . Cancer Mother   . Breast cancer Mother 46  . Heart disease Father   . Stroke Father   . Diabetes Father     Social History Social History   Tobacco Use  . Smoking status: Former Smoker    Last attempt to quit: 2008    Years since quitting: 11.3  . Smokeless tobacco: Never Used  Substance Use Topics  . Alcohol use: No  . Drug  use: No    Review of Systems  Constitutional: No fever/chills Eyes: No visual changes.  ENT: No sore throat. Cardiovascular: Denies chest pain. Respiratory: Denies shortness of breath. Gastrointestinal: As above Genitourinary: Negative for dysuria. Musculoskeletal: No myalgias Skin: No rash Neurological: Negative for headaches    ____________________________________________   PHYSICAL EXAM:  VITAL SIGNS: ED Triage Vitals  Enc Vitals Group     BP 06/25/17 0828 (!) 148/49     Pulse Rate 06/25/17 0828 68     Resp 06/25/17 0828 18     Temp 06/25/17 0828 97.6 F (36.4 C)     Temp Source 06/25/17 0828  Oral     SpO2 06/25/17 0828 100 %     Weight 06/25/17 0829 60.3 kg (133 lb)     Height 06/25/17 0829 1.499 m ( )     Head Circumference --      Peak Flow --      Pain Score 06/25/17 0828 10     Pain Loc --      Pain Edu? --      Excl. in GC? --     Constitutional: Alert and oriented. Eyes: Conjunctivae are normal.  . Nose: No congestion/rhinnorhea. Mouth/Throat: Mucous membranes are moist.    Cardiovascular: Normal rate, regular rhythm. Grossly normal heart sounds.  Good peripheral circulation. Respiratory: Normal respiratory effort.  No retractions. Lungs CTAB. Gastrointestinal: Soft and nontender. No distention.  Genitourinary: deferred Musculoskeletal: No lower extremity tenderness nor edema.  Warm and well perfused Neurologic:  Normal speech and language. No gross focal neurologic deficits are appreciated.  Skin:  Skin is warm, dry and intact. No rash noted. Psychiatric: Mood and affect are normal. Speech and behavior are normal.  ____________________________________________   LABS (all labs ordered are listed, but only abnormal results are displayed)  Labs Reviewed  COMPREHENSIVE METABOLIC PANEL - Abnormal; Notable for the following components:      Result Value   Sodium 134 (*)    Potassium 3.4 (*)    Chloride 99 (*)    CO2 21 (*)    Glucose, Bld 232 (*)    Total Protein 8.5 (*)    All other components within normal limits  LIPASE, BLOOD  CBC  URINALYSIS, COMPLETE (UACMP) WITH MICROSCOPIC   ____________________________________________  EKG  ED ECG REPORT I, Jene Every, the attending physician, personally viewed and interpreted this ECG.  Date: 06/25/2017  Rhythm: normal sinus rhythm QRS Axis: normal Intervals: normal ST/T Wave abnormalities: normal Narrative Interpretation: no evidence of acute  ischemia  ____________________________________________  RADIOLOGY  None ____________________________________________   PROCEDURES  Procedure(s) performed: No  Procedures   Critical Care performed: No ____________________________________________   INITIAL IMPRESSION / ASSESSMENT AND PLAN / ED COURSE  Pertinent labs & imaging results that were available during my care of the patient were reviewed by me and considered in my medical decision making (see chart for details).  Presents with nausea vomiting, now with epigastric burning as well.  This may all be a reaction to Augmentin although it seems somewhat prolonged she has stopped taking it after the initial dose.  Regardless she appears to have some form of gastritis, will treat with IV Zofran, IV fluids, trial GI cocktail after nausea has improved  Patient unable to tolerate GI cocktail.  Continues to complain of pain in the abdomen, IV morphine given.  Continues to have severe nausea, has received multiple doses of IV Zofran at this point, will admit to the hospital service for  e further evaluation and treatment    ____________________________________________   FINAL CLINICAL IMPRESSION(S) / ED DIAGNOSES  Final diagnoses:  Acute gastritis without hemorrhage, unspecified gastritis type        Note:  This document was prepared using Dragon voice recognition software and may include unintentional dictation errors.    Jene Every, MD 06/25/17 1311

## 2017-06-25 NOTE — ED Notes (Signed)
Patient's sister, Thomes Dinning, left her phone number for updates. [phone]

## 2017-06-25 NOTE — ED Notes (Signed)
Patient given saltines and water for PO challenge per Dr. Cyril Loosen.

## 2017-06-25 NOTE — ED Notes (Signed)
Pt states she vomited GI cocktail. Dr. Cyril Loosen made aware.

## 2017-06-26 DIAGNOSIS — K2900 Acute gastritis without bleeding: Secondary | ICD-10-CM

## 2017-06-26 DIAGNOSIS — R933 Abnormal findings on diagnostic imaging of other parts of digestive tract: Secondary | ICD-10-CM

## 2017-06-26 LAB — URINALYSIS, COMPLETE (UACMP) WITH MICROSCOPIC
BACTERIA UA: NONE SEEN
BILIRUBIN URINE: NEGATIVE
Glucose, UA: >=500 mg/dL — AB
HGB URINE DIPSTICK: NEGATIVE
KETONES UR: 20 mg/dL — AB
LEUKOCYTES UA: NEGATIVE
Nitrite: NEGATIVE
PROTEIN: 30 mg/dL — AB
Specific Gravity, Urine: 1.026 (ref 1.005–1.030)
pH: 6 (ref 5.0–8.0)

## 2017-06-26 LAB — HEMOGLOBIN A1C
HEMOGLOBIN A1C: 12.2 % — AB (ref 4.8–5.6)
MEAN PLASMA GLUCOSE: 303.44 mg/dL

## 2017-06-26 LAB — COMPREHENSIVE METABOLIC PANEL
ALT: 14 U/L (ref 14–54)
ANION GAP: 9 (ref 5–15)
AST: 15 U/L (ref 15–41)
Albumin: 3.3 g/dL — ABNORMAL LOW (ref 3.5–5.0)
Alkaline Phosphatase: 71 U/L (ref 38–126)
BILIRUBIN TOTAL: 0.7 mg/dL (ref 0.3–1.2)
BUN: 10 mg/dL (ref 6–20)
CALCIUM: 8.1 mg/dL — AB (ref 8.9–10.3)
CO2: 23 mmol/L (ref 22–32)
CREATININE: 0.5 mg/dL (ref 0.44–1.00)
Chloride: 106 mmol/L (ref 101–111)
GFR calc Af Amer: >60 mL/min (ref >60)
GLUCOSE: 139 mg/dL — AB (ref 65–99)
Potassium: 3.6 mmol/L (ref 3.5–5.1)
Sodium: 138 mmol/L (ref 135–145)
TOTAL PROTEIN: 6.8 g/dL (ref 6.5–8.1)

## 2017-06-26 LAB — CBC
HCT: 37.4 % (ref 35.0–47.0)
HEMOGLOBIN: 12.9 g/dL (ref 12.0–16.0)
MCH: 30.3 pg (ref 26.0–34.0)
MCHC: 34.6 g/dL (ref 32.0–36.0)
MCV: 87.6 fL (ref 80.0–100.0)
PLATELETS: 360 10*3/uL (ref 150–440)
RBC: 4.27 MIL/uL (ref 3.80–5.20)
RDW: 12.4 % (ref 11.5–14.5)
WBC: 5.6 10*3/uL (ref 3.6–11.0)

## 2017-06-26 LAB — GLUCOSE, CAPILLARY
GLUCOSE-CAPILLARY: 134 mg/dL — AB (ref 65–99)
GLUCOSE-CAPILLARY: 114 mg/dL — AB (ref 65–99)
GLUCOSE-CAPILLARY: 141 mg/dL — AB (ref 65–99)
Glucose-Capillary: 138 mg/dL — ABNORMAL HIGH (ref 65–99)

## 2017-06-26 MED ORDER — MORPHINE SULFATE (PF) 2 MG/ML IV SOLN
2.0000 mg | INTRAVENOUS | Status: DC | PRN
  Administered 2017-06-26 – 2017-06-27 (×3): 2 mg via INTRAVENOUS
  Filled 2017-06-26 (×3): qty 1

## 2017-06-26 MED ORDER — MORPHINE SULFATE (PF) 2 MG/ML IV SOLN
INTRAVENOUS | Status: AC
  Administered 2017-06-26: 2 mg via INTRAVENOUS
  Filled 2017-06-26: qty 1

## 2017-06-26 MED ORDER — ONDANSETRON HCL 4 MG/2ML IJ SOLN
4.0000 mg | Freq: Four times a day (QID) | INTRAMUSCULAR | Status: DC
  Administered 2017-06-26 – 2017-06-27 (×3): 4 mg via INTRAVENOUS
  Filled 2017-06-26 (×3): qty 2

## 2017-06-26 MED ORDER — PROMETHAZINE HCL 25 MG/ML IJ SOLN
25.0000 mg | Freq: Four times a day (QID) | INTRAMUSCULAR | Status: DC | PRN
  Administered 2017-06-26: 25 mg via INTRAVENOUS
  Filled 2017-06-26: qty 1

## 2017-06-26 NOTE — Consult Note (Signed)
[name] , MD [address], [address], [place], [place], [address] [address], [place], [place], [address] Phone: [phone]  Fax: [phone]  Consultation  Referring Provider:   Dr Allena Katz  Primary Care Physician:  Toy Cookey, FNP Primary Gastroenterologist:None         Reason for Consultation:     Nausea and vomiting   Date of Admission:  06/25/2017 Date of Consultation:  06/26/2017         HPI:   Meredith Martinez is a 67 y.o. female admitted on 06/25/17 with nausea, vomiting and abdominal pain of two days duration . She was started on some Augmentin for a cyst found by her dentist. On admission had elevated glucose of 232 ands normal CBC, LFT's.  She underwent a CT scan of her abdomen and it demonstrated some thickness in the proximal aspect of the stomach - gastritis vs gastric mass that cannot be ruled out.     She says been throwing upto 10 times each day last 2 days after the augmentin was started. Denies any abdominal pain , weight loss, blood in vomitus or family history of stomach cancer. No history of smoking . Presently feels better after starting phenergan.No diarrhea.    Past Medical History:  Diagnosis Date  . Diabetes mellitus without complication (HCC)    niddm    Past Surgical History:  Procedure Laterality Date  . BREAST BIOPSY Right    neg    Prior to Admission medications   Medication Sig Start Date End Date Taking? Authorizing Provider  acetaminophen (TYLENOL) 500 MG tablet Take 500 mg by mouth 2 (two) times daily. 2 tabs bid    [provider]  amitriptyline (ELAVIL) 25 MG tablet At bedtime 09/30/16   Barbette Merino, NP  Cholecalciferol (VITAMIN D-3) 1000 units CAPS Take by mouth daily.    [provider]  lisinopril (PRINIVIL,ZESTRIL) 10 MG tablet Take 10 mg by mouth daily.    [provider]  loratadine (CLARITIN) 10 MG tablet Take 10 mg by mouth daily.    [provider]  metFORMIN (GLUCOPHAGE)  500 MG tablet Take 500 mg by mouth 2 (two) times daily with a meal.    [provider]  montelukast (SINGULAIR) 10 MG tablet Take 10 mg by mouth at bedtime.    [provider]  naproxen sodium (ANAPROX) 220 MG tablet Take 220 mg by mouth at bedtime. 2 tabs at night    [provider]  pravastatin (PRAVACHOL) 40 MG tablet Take 40 mg by mouth daily.    [provider]  promethazine (PHENERGAN) 25 MG tablet Take 25 mg by mouth.    [provider]  vitamin B-12 (CYANOCOBALAMIN) 500 MCG tablet Take 500 mcg by mouth daily.    [provider]    Family History  Problem Relation Age of Onset  . Heart disease Mother   . Diabetes Mother   . Arthritis Mother   . Cancer Mother   . Breast cancer Mother 39  . Heart disease Father   . Stroke Father   . Diabetes Father      Social History   Tobacco Use  . Smoking status: Former Smoker    Last attempt to quit: 2008    Years since quitting: 11.3  . Smokeless tobacco: Never Used  Substance Use Topics  . Alcohol use: No  . Drug use: No    Allergies as of 06/25/2017 - Review Complete 06/25/2017  Allergen Reaction Noted  . Amoxicillin Nausea And Vomiting 06/25/2017  . Iodine  06/25/2017    Review of Systems:    All systems reviewed and negative except where noted in HPI.   Physical Exam:  Vital signs in last 24 hours: Temp:  [98 F (36.7 C)-98.2 F (36.8 C)] 98.1 F (36.7 C) (05/05 0540) Pulse Rate:  [59-71] 60 (05/05 0540) Resp:  [14-20] 20 (05/05 0540) BP: (115-175)/(58-91) 143/63 (05/05 0540) SpO2:  [95 %-100 %] 99 % (05/05 0540) Weight:  [134 lb 7.7 oz (61 kg)] 134 lb 7.7 oz (61 kg) (05/05 0540) Last BM Date: 06/23/17 General:   Pleasant, cooperative in NAD Head:  Normocephalic and atraumatic. Eyes:   No icterus.   Conjunctiva pink. PERRLA. Ears:  Normal auditory acuity. Neck:  Supple; no masses or thyroidomegaly Lungs: Respirations even and unlabored. Lungs clear to  auscultation bilaterally.   No wheezes, crackles, or rhonchi.  Heart:  Regular rate and rhythm;  Without murmur, clicks, rubs or gallops Abdomen:  Soft, nondistended, nontender. Normal bowel sounds. No appreciable masses or hepatomegaly.  No rebound or guarding.  Neurologic:  Alert and oriented x3;  grossly normal neurologically. Skin:  Intact without significant lesions or rashes. Cervical Nodes:  No significant cervical adenopathy. Psych:  Alert and cooperative. Normal affect.  LAB RESULTS: Recent Labs    06/25/17 0833 06/26/17 0543  WBC 7.9 5.6  HGB 13.6 12.9  HCT 39.7 37.4  PLT 385 360   BMET Recent Labs    06/25/17 0833 06/26/17 0543  NA 134* 138  K 3.4* 3.6  CL 99* 106  CO2 21* 23  GLUCOSE 232* 139*  BUN 15 10  CREATININE 0.64 0.50  CALCIUM 9.2 8.1*   LFT Recent Labs    06/26/17 0543  PROT 6.8  ALBUMIN 3.3*  AST 15  ALT 14  ALKPHOS 71  BILITOT 0.7   PT/INR No results for input(s): LABPROT, INR in the last 72 hours.  STUDIES: Ct Abdomen Pelvis Wo Contrast  Result Date: 06/25/2017 CLINICAL DATA:  Abdominal pain, nausea and vomiting. EXAM: CT ABDOMEN AND PELVIS WITHOUT CONTRAST TECHNIQUE: Multidetector CT imaging of the abdomen and pelvis was performed following the standard protocol without IV contrast. COMPARISON:  None. FINDINGS: Lower chest: No acute abnormality. Hepatobiliary: No focal liver abnormality is seen. No gallstones, gallbladder wall thickening, or biliary dilatation. Pancreas: Unremarkable. No pancreatic ductal dilatation or surrounding inflammatory changes. Spleen: Normal in size without focal abnormality. Adrenals/Urinary Tract: Adrenal glands are unremarkable. Kidneys are normal, without renal calculi, focal lesion, or hydronephrosis. Bladder is unremarkable. Stomach/Bowel: There is suggestion on the unenhanced scan some potential wall thickening involving the proximal stomach. At the level of the posterior fundus and cardia, the stomach may  measure as much as 2.5 cm in thickness. Without IV or oral contrast, assessment of the stomach is limited. Implication would be at least gastritis. Presence of a gastric mass cannot be excluded. No evidence of bowel perforation, bowel obstruction or abscess. The appendix is normal. Vascular/Lymphatic: No significant vascular findings are present. No enlarged abdominal or pelvic lymph nodes. Reproductive: Uterus and bilateral adnexa are unremarkable. Other: No abdominal wall hernia or abnormality. No abdominopelvic ascites. Musculoskeletal: No acute or significant osseous findings. IMPRESSION: The only potential abnormality seen by unenhanced CT is some prominent thickness of the proximal stomach, especially involving the posterior fundus/cardia. Underlying gastritis may be present. Presence of a gastric mass cannot be excluded. Electronically Signed   By: Rudene Anda.D.  On: 06/25/2017 15:45      Impression / Plan:   Tamikia Chowning is a 67 y.o. y/o female admitted with a short 2-3 days history of nausea, vomiting and abdominal pain after starting Augmentin. CT scan shows some thickeness in the stomach and needs further evaluation .   Plan  1. IV PPI 2. Reglan to empirically treat gastroparesis 3. EGD to evaluate abnormality seen on the CT scan of the abdomen   I have discussed alternative options, risks & benefits,  which include, but are not limited to, bleeding, infection, perforation,respiratory complication & drug reaction.  The patient agrees with this plan & written consent will be obtained.     Thank you for involving me in the care of this patient.      LOS: 1 day   [name], MD  06/26/2017, 1:03 PM

## 2017-06-26 NOTE — Progress Notes (Signed)
[facility] - [place] at [facility]                                                                                                                                                                                  Patient Demographics   Meredith Martinez, is a 67 y.o. female, DOB - Jul 27, 1950, HQI:[id number]  Admit date - 06/25/2017   Admitting Physician Milagros Loll, MD  Outpatient Primary MD for the patient is Toy Cookey, FNP   LOS - 1  Subjective: Patient admitted with intractable nausea vomiting continues to be very nauseous and throwing up Zofran and Reglan does not seem to have helped continues to have abdominal pain    Review of Systems:   CONSTITUTIONAL: No documented fever. No fatigue, weakness. No weight gain, no weight loss.  EYES: No blurry or double vision.  ENT: No tinnitus. No postnasal drip. No redness of the oropharynx.  RESPIRATORY: No cough, no wheeze, no hemoptysis. No dyspnea.  CARDIOVASCULAR: No chest pain. No orthopnea. No palpitations. No syncope.  GASTROINTESTINAL: Positive nausea, positive vomiting or diarrhea.  Positive abdominal pain. No melena or hematochezia.  GENITOURINARY: No dysuria or hematuria.  ENDOCRINE: No polyuria or nocturia. No heat or cold intolerance.  HEMATOLOGY: No anemia. No bruising. No bleeding.  INTEGUMENTARY: No rashes. No lesions.  MUSCULOSKELETAL: No arthritis. No swelling. No gout.  NEUROLOGIC: No numbness, tingling, or ataxia. No seizure-type activity.  PSYCHIATRIC: No anxiety. No insomnia. No ADD.    Vitals:   Vitals:   06/25/17 1452 06/25/17 1537 06/25/17 2023 06/26/17 0540  BP: (!) 115/91 (!) 175/61 (!) 139/58 (!) 143/63  Pulse: 67 71 66 60  Resp: Temp:  98 F (36.7 C) 98.2 F (36.8 C) 98.1 F (36.7 C)  TempSrc:  Oral Oral Oral  SpO2: 95% 100% 100% 99%  Weight:    61 kg (134 lb 7.7 oz)  Height:        Wt Readings from Last 3 Encounters:  06/26/17 61 kg (134 lb 7.7 oz)  09/30/16  59 kg (130 lb)     Intake/Output Summary (Last 24 hours) at 06/26/2017 1258 Last data filed at 06/26/2017 1223 Gross per 24 hour  Intake 2035 ml  Output 975 ml  Net 1060 ml    Physical Exam:   GENERAL: Pleasant-appearing in no apparent distress.  HEAD, EYES, EARS, NOSE AND THROAT: Atraumatic, normocephalic. Extraocular muscles are intact. Pupils equal and reactive to light. Sclerae anicteric. No conjunctival injection. No oro-pharyngeal erythema.  NECK: Supple. There is no jugular venous distention. No bruits, no lymphadenopathy, no thyromegaly.  HEART: Regular rate and rhythm,. No murmurs, no rubs, no clicks.  LUNGS: Clear to auscultation bilaterally. No rales or rhonchi. No wheezes.  ABDOMEN: Soft, flat, epigastric tenderness, nondistended. Has good bowel sounds. No hepatosplenomegaly appreciated.  EXTREMITIES: No evidence of any cyanosis, clubbing, or peripheral edema.  +2 pedal and radial pulses bilaterally.  NEUROLOGIC: The patient is alert, awake, and oriented x3 with no focal motor or sensory deficits appreciated bilaterally.  SKIN: Moist and warm with no rashes appreciated.  Psych: Not anxious, depressed LN: No inguinal LN enlargement    Antibiotics   Anti-infectives (From admission, onward)   None      Medications   Scheduled Meds: . enoxaparin (LOVENOX) injection  40 mg Subcutaneous Q24H  . insulin aspart  0-5 Units Subcutaneous QHS  . insulin aspart  0-9 Units Subcutaneous TID WC  . metoCLOPramide (REGLAN) injection  5 mg Intravenous Q6H  . ondansetron (ZOFRAN) IV  4 mg Intravenous Q6H   Continuous Infusions: . 0.9 % NaCl with KCl 20 mEq / L 100 mL/hr at 06/26/17 0629  . famotidine (PEPCID) IV 20 mg (06/26/17 1128)   PRN Meds:.acetaminophen **OR** acetaminophen, morphine injection, oxyCODONE, polyethylene glycol, promethazine   Data Review:   Micro Results No results found for this or any previous visit (from the past 240 hour(s)).  Radiology Reports Ct  Abdomen Pelvis Wo Contrast  Result Date: 06/25/2017 CLINICAL DATA:  Abdominal pain, nausea and vomiting. EXAM: CT ABDOMEN AND PELVIS WITHOUT CONTRAST TECHNIQUE: Multidetector CT imaging of the abdomen and pelvis was performed following the standard protocol without IV contrast. COMPARISON:  None. FINDINGS: Lower chest: No acute abnormality. Hepatobiliary: No focal liver abnormality is seen. No gallstones, gallbladder wall thickening, or biliary dilatation. Pancreas: Unremarkable. No pancreatic ductal dilatation or surrounding inflammatory changes. Spleen: Normal in size without focal abnormality. Adrenals/Urinary Tract: Adrenal glands are unremarkable. Kidneys are normal, without renal calculi, focal lesion, or hydronephrosis. Bladder is unremarkable. Stomach/Bowel: There is suggestion on the unenhanced scan some potential wall thickening involving the proximal stomach. At the level of the posterior fundus and cardia, the stomach may measure as much as 2.5 cm in thickness. Without IV or oral contrast, assessment of the stomach is limited. Implication would be at least gastritis. Presence of a gastric mass cannot be excluded. No evidence of bowel perforation, bowel obstruction or abscess. The appendix is normal. Vascular/Lymphatic: No significant vascular findings are present. No enlarged abdominal or pelvic lymph nodes. Reproductive: Uterus and bilateral adnexa are unremarkable. Other: No abdominal wall hernia or abnormality. No abdominopelvic ascites. Musculoskeletal: No acute or significant osseous findings. IMPRESSION: The only potential abnormality seen by unenhanced CT is some prominent thickness of the proximal stomach, especially involving the posterior fundus/cardia. Underlying gastritis may be present. Presence of a gastric mass cannot be excluded. Electronically Signed   By: Irish Lack M.D.   On: 06/25/2017 15:45     CBC Recent Labs  Lab 06/25/17 0833 06/26/17 0543  WBC 7.9 5.6  HGB 13.6  12.9  HCT 39.7 37.4  PLT 385 360  MCV 85.9 87.6  MCH 29.5 30.3  MCHC 34.4 34.6  RDW 12.4 12.4    Chemistries  Recent Labs  Lab 06/25/17 0833 06/26/17 0543  NA 134* 138  K 3.4* 3.6  CL 99* 106  CO2 21* 23  GLUCOSE 232* 139*  BUN 15 10  CREATININE 0.64 0.50  CALCIUM 9.2 8.1*  AST 23 15  ALT 21 14  ALKPHOS 95 71  BILITOT 0.9 0.7   ------------------------------------------------------------------------------------------------------------------ estimated creatinine clearance is 56.4 mL/min (by C-G formula  based on SCr of 0.5 mg/dL). ------------------------------------------------------------------------------------------------------------------ Recent Labs    06/25/17 0833  HGBA1C 12.2*   ------------------------------------------------------------------------------------------------------------------ No results for input(s): CHOL, HDL, LDLCALC, TRIG, CHOLHDL, LDLDIRECT in the last 72 hours. ------------------------------------------------------------------------------------------------------------------ No results for input(s): TSH, T4TOTAL, T3FREE, THYROIDAB in the last 72 hours.  Invalid input(s): FREET3 ------------------------------------------------------------------------------------------------------------------ No results for input(s): VITAMINB12, FOLATE, FERRITIN, TIBC, IRON, RETICCTPCT in the last 72 hours.  Coagulation profile No results for input(s): INR, PROTIME in the last 168 hours.  No results for input(s): DDIMER in the last 72 hours.  Cardiac Enzymes No results for input(s): CKMB, TROPONINI, MYOGLOBIN in the last 168 hours.  Invalid input(s): CK ------------------------------------------------------------------------------------------------------------------ Invalid input(s): POCBNP    Assessment & Plan  Patient is a 67 year old with diabetes presenting with intractable nausea vomiting  *Intractable nausea and vomiting.  Etiology unclear.   Could be gastritis from Augmentin.   CT scan shows possible gastritis However no significant improvement in patient's at symptoms despite antiemetics and Reglan I will ask GI to see I will place patient on Zofran scheduled and in between use IV Phenergan Continue IV Pepcid  *Diabetes mellitus.  Sliding scale insulin.  *DVT prophylaxis with Lovenox       Code Status Orders  (From admission, onward)        Start     Ordered   06/25/17 1401  Full code  Continuous     06/25/17 1401    Code Status History    This patient has a current code status but no historical code status.           Consults gastroenterology  DVT Prophylaxis  Lovenox  Lab Results  Component Value Date   PLT 360 06/26/2017     Time Spent in minutes   35 minutes  Greater than 50% of time spent in care coordination and counseling patient regarding the condition and plan of care.   Auburn Bilberry M.D on 06/26/2017 at 12:58 PM  Between 7am to 6pm - Pager - [phone]  After 6pm go to [email or web address] - Social research officer, government  [facility]   Office  [phone]

## 2017-06-27 ENCOUNTER — Encounter: Payer: Self-pay | Admitting: Anesthesiology

## 2017-06-27 ENCOUNTER — Encounter
Admission: EM | Disposition: A | Discharge status: Home / Self Care | Payer: Self-pay | Source: Home / Self Care | Attending: Internal Medicine

## 2017-06-27 ENCOUNTER — Inpatient Hospital Stay: Payer: Medicare Other | Admitting: Anesthesiology

## 2017-06-27 HISTORY — PX: ESOPHAGOGASTRODUODENOSCOPY (EGD) WITH PROPOFOL: SHX5813

## 2017-06-27 LAB — GLUCOSE, CAPILLARY: GLUCOSE-CAPILLARY: 101 mg/dL — AB (ref 65–99)

## 2017-06-27 LAB — HIV ANTIBODY (ROUTINE TESTING W REFLEX): HIV Screen 4th Generation wRfx: NONREACTIVE

## 2017-06-27 SURGERY — ESOPHAGOGASTRODUODENOSCOPY (EGD) WITH PROPOFOL
Anesthesia: General

## 2017-06-27 MED ORDER — PROPOFOL 500 MG/50ML IV EMUL
INTRAVENOUS | Status: AC
  Filled 2017-06-27: qty 50

## 2017-06-27 MED ORDER — ONDANSETRON HCL 4 MG PO TABS: 4.0000 mg | ORAL_TABLET | Freq: Three times a day (TID) | ORAL | 1 refills | Status: AC | PRN

## 2017-06-27 MED ORDER — MIDAZOLAM HCL 2 MG/2ML IJ SOLN
INTRAMUSCULAR | Status: AC
  Filled 2017-06-27: qty 2

## 2017-06-27 MED ORDER — INSULIN DETEMIR 100 UNIT/ML FLEXPEN: 12.0000 [IU] | PEN_INJECTOR | Freq: Every day | SUBCUTANEOUS | 11 refills | Status: DC

## 2017-06-27 MED ORDER — FENTANYL CITRATE (PF) 100 MCG/2ML IJ SOLN
INTRAMUSCULAR | Status: DC | PRN
  Administered 2017-06-27: 50 ug via INTRAVENOUS

## 2017-06-27 MED ORDER — PROPOFOL 500 MG/50ML IV EMUL
INTRAVENOUS | Status: DC | PRN
  Administered 2017-06-27: 100 ug/kg/min via INTRAVENOUS

## 2017-06-27 MED ORDER — SODIUM CHLORIDE 0.9 % IV SOLN
INTRAVENOUS | Status: DC | PRN
  Administered 2017-06-27: 12:00:00 via INTRAVENOUS

## 2017-06-27 MED ORDER — LIDOCAINE HCL (CARDIAC) PF 100 MG/5ML IV SOSY
PREFILLED_SYRINGE | INTRAVENOUS | Status: DC | PRN
  Administered 2017-06-27: 30 mg via INTRAVENOUS

## 2017-06-27 MED ORDER — INSULIN DETEMIR 100 UNIT/ML FLEXPEN: 15.0000 [IU] | Freq: Every day | SUBCUTANEOUS | 6 refills | Status: DC

## 2017-06-27 MED ORDER — FENTANYL CITRATE (PF) 100 MCG/2ML IJ SOLN
INTRAMUSCULAR | Status: AC
  Filled 2017-06-27: qty 2

## 2017-06-27 MED ORDER — INSULIN STARTER KIT- PEN NEEDLES (ENGLISH)
1.0000 | Freq: Once | Status: AC
  Administered 2017-06-27: 1
  Filled 2017-06-27: qty 1

## 2017-06-27 MED ORDER — LIDOCAINE HCL (PF) 2 % IJ SOLN
INTRAMUSCULAR | Status: AC
  Filled 2017-06-27: qty 10

## 2017-06-27 MED ORDER — MIDAZOLAM HCL 2 MG/2ML IJ SOLN
INTRAMUSCULAR | Status: DC | PRN
  Administered 2017-06-27: 2 mg via INTRAVENOUS

## 2017-06-27 NOTE — Anesthesia Post-op Follow-up Note (Signed)
Anesthesia QCDR form completed.        

## 2017-06-27 NOTE — Discharge Summary (Addendum)
[facility] - [place] at [facility], 67 y.o., DOB 10/20/1950, MRN [id number]. Admission date: 06/25/2017 Discharge Date 06/27/2017 Primary MD Toy Cookey, FNP Admitting Physician Milagros Loll, MD  Admission Diagnosis  Acute gastritis without hemorrhage, unspecified gastritis type [K29.00]  Discharge Diagnosis   Active Problems: Intractable nausea vomiting possibly due to gastroenteritis hypokalemia Diabetes type 2 with poor control patient states that she does not want to take pills anymore started on insulin Essential hypertension       Hospital Course  Patient 67 year old diabetic presenting with intractable nausea vomiting.  She was recently started on antibiotics for possible sinus infection by her primary care provider.  Once she started taking the antibiotic she stated that she started feeling bad.  Started having severe nausea vomiting.  Came to the ER had a CT scan of the abdomen which showed some gastric thickening.  Due to her persistent symptoms GI was consulted she underwent EGD which was normal.  Patient symptoms have resolved.  She is stable for discharge.             Consults  GI  Significant Tests:  See full reports for all details     Ct Abdomen Pelvis Wo Contrast  Result Date: 06/25/2017 CLINICAL DATA:  Abdominal pain, nausea and vomiting. EXAM: CT ABDOMEN AND PELVIS WITHOUT CONTRAST TECHNIQUE: Multidetector CT imaging of the abdomen and pelvis was performed following the standard protocol without IV contrast. COMPARISON:  None. FINDINGS: Lower chest: No acute abnormality. Hepatobiliary: No focal liver abnormality is seen. No gallstones, gallbladder wall thickening, or biliary dilatation. Pancreas: Unremarkable. No pancreatic ductal dilatation or surrounding inflammatory changes. Spleen: Normal in size without focal abnormality. Adrenals/Urinary Tract: Adrenal glands are unremarkable. Kidneys are normal, without renal  calculi, focal lesion, or hydronephrosis. Bladder is unremarkable. Stomach/Bowel: There is suggestion on the unenhanced scan some potential wall thickening involving the proximal stomach. At the level of the posterior fundus and cardia, the stomach may measure as much as 2.5 cm in thickness. Without IV or oral contrast, assessment of the stomach is limited. Implication would be at least gastritis. Presence of a gastric mass cannot be excluded. No evidence of bowel perforation, bowel obstruction or abscess. The appendix is normal. Vascular/Lymphatic: No significant vascular findings are present. No enlarged abdominal or pelvic lymph nodes. Reproductive: Uterus and bilateral adnexa are unremarkable. Other: No abdominal wall hernia or abnormality. No abdominopelvic ascites. Musculoskeletal: No acute or significant osseous findings. IMPRESSION: The only potential abnormality seen by unenhanced CT is some prominent thickness of the proximal stomach, especially involving the posterior fundus/cardia. Underlying gastritis may be present. Presence of a gastric mass cannot be excluded. Electronically Signed   By: Irish Lack M.D.   On: 06/25/2017 15:45       Today   Subjective:   Meredith Martinez  Pt feeling doing much better no further symptoms  Objective:   Blood pressure (!) 120/54, pulse (!) 56, temperature 97.6 F (36.4 C), temperature source Oral, resp. rate 18, height  (1.499 m), weight 61 kg (134 lb 7.7 oz), SpO2 100 %.  .  Intake/Output Summary (Last 24 hours) at 06/27/2017 1534 Last data filed at 06/27/2017 1214 Gross per 24 hour  Intake 1495 ml  Output 1300 ml  Net 195 ml    Exam VITAL SIGNS: Blood pressure (!) 120/54, pulse (!) 56, temperature 97.6 F (36.4 C), temperature source Oral, resp. rate 18, height  (1.499 m), weight 61 kg (134 lb 7.7 oz),  SpO2 100 %.  GENERAL:  67 y.o.-year-old patient lying in the bed with no acute distress.  EYES: Pupils equal, round, reactive  to light and accommodation. No scleral icterus. Extraocular muscles intact.  HEENT: Head atraumatic, normocephalic. Oropharynx and nasopharynx clear.  NECK:  Supple, no jugular venous distention. No thyroid enlargement, no tenderness.  LUNGS: Normal breath sounds bilaterally, no wheezing, rales,rhonchi or crepitation. No use of accessory muscles of respiration.  CARDIOVASCULAR: S1, S2 normal. No murmurs, rubs, or gallops.  ABDOMEN: Soft, nontender, nondistended. Bowel sounds present. No organomegaly or mass.  EXTREMITIES: No pedal edema, cyanosis, or clubbing.  NEUROLOGIC: Cranial nerves II through XII are intact. Muscle strength 5/5 in all extremities. Sensation intact. Gait not checked.  PSYCHIATRIC: The patient is alert and oriented x 3.  SKIN: No obvious rash, lesion, or ulcer.   Data Review     CBC w Diff:  Lab Results  Component Value Date   WBC 5.6 06/26/2017   HGB 12.9 06/26/2017   HCT 37.4 06/26/2017   PLT 360 06/26/2017   CMP:  Lab Results  Component Value Date   NA 138 06/26/2017   NA 136 09/30/2016   K 3.6 06/26/2017   CL 106 06/26/2017   CO2 23 06/26/2017   BUN 10 06/26/2017   BUN 10 09/30/2016   CREATININE 0.50 06/26/2017   PROT 6.8 06/26/2017   PROT 8.0 09/30/2016   ALBUMIN 3.3 (L) 06/26/2017   ALBUMIN 4.7 09/30/2016   BILITOT 0.7 06/26/2017   BILITOT 0.4 09/30/2016   ALKPHOS 71 06/26/2017   AST 15 06/26/2017   ALT 14 06/26/2017  .  Micro Results No results found for this or any previous visit (from the past 240 hour(s)).      Code Status Orders  (From admission, onward)        Start     Ordered   06/25/17 1401  Full code  Continuous     06/25/17 1401    Code Status History    This patient has a current code status but no historical code status.          Follow-up Information    Toy Cookey, FNP Follow up in 6 day(s).   Specialty:  Family Medicine Why:  Meredith Martinez's office will call you back with an appointment - please call  if you haven't heard from them by Wednesday morning please give their office a call  [phone] Contact information: [address] [place] [address] [phone]           Discharge Medications   Allergies as of 06/27/2017      Reactions   Amoxicillin Nausea And Vomiting   Iodine       Medication List    STOP taking these medications   metFORMIN 500 MG tablet Commonly known as:  GLUCOPHAGE     TAKE these medications   acetaminophen 500 MG tablet Commonly known as:  TYLENOL Take 500 mg by mouth 2 (two) times daily. 2 tabs bid   amitriptyline 25 MG tablet Commonly known as:  ELAVIL At bedtime   Insulin Detemir 100 UNIT/ML Pen Commonly known as:  LEVEMIR FLEXTOUCH Inject 12 Units into the skin daily at 10 pm.   lisinopril 10 MG tablet Commonly known as:  PRINIVIL,ZESTRIL Take 10 mg by mouth daily.   loratadine 10 MG tablet Commonly known as:  CLARITIN Take 10 mg by mouth daily.   montelukast 10 MG tablet Commonly known as:  SINGULAIR Take 10 mg  by mouth at bedtime.   naproxen sodium 220 MG tablet Commonly known as:  ALEVE Take 220 mg by mouth at bedtime. 2 tabs at night   ondansetron 4 MG tablet Commonly known as:  ZOFRAN Take 1 tablet (4 mg total) by mouth every 8 (eight) hours as needed for nausea or vomiting.   pravastatin 40 MG tablet Commonly known as:  PRAVACHOL Take 40 mg by mouth daily.   promethazine 25 MG tablet Commonly known as:  PHENERGAN Take 25 mg by mouth.   vitamin B-12 500 MCG tablet Commonly known as:  CYANOCOBALAMIN Take 500 mcg by mouth daily.   Vitamin D-3 1000 units Caps Take by mouth daily.          Total Time in preparing paper work, data evaluation and todays exam - 35 minutes  Auburn Bilberry M.D on 06/27/2017 at 3:34 PM [facility]   Office  [phone]

## 2017-06-27 NOTE — Op Note (Signed)
[facility] Gastroenterology Patient Name: Meredith Martinez Procedure Date: 06/27/2017 12:02 PM MRN: [id number] Account #: [id number] Date of Birth: 07/16/1950 Admit Type: Inpatient Age: 67 Room: [facility] ENDO ROOM 4 Gender: Female Note Status: Finalized Procedure:            Upper GI endoscopy Indications:          Abnormal CT of the GI tract Providers:            [name] MD, MD Referring MD:         No Local Md, MD (Referring MD) Medicines:            Monitored Anesthesia Care Complications:        No immediate complications. Procedure:            Pre-Anesthesia Assessment:                       - Prior to the procedure, a History and Physical was                        performed, and patient medications, allergies and                        sensitivities were reviewed. The patient's tolerance of                        previous anesthesia was reviewed.                       - The risks and benefits of the procedure and the                        sedation options and risks were discussed with the                        patient. All questions were answered and informed                        consent was obtained.                       - ASA Grade Assessment: II - A patient with mild                        systemic disease.                       After obtaining informed consent, the endoscope was                        passed under direct vision. Throughout the procedure,                        the patient's blood pressure, pulse, and oxygen                        saturations were monitored continuously. The Endoscope                        was introduced through the mouth, and advanced to the  third part of duodenum. The upper GI endoscopy was                        accomplished with ease. The patient tolerated the                        procedure well. Findings:      The esophagus was normal.      The examined duodenum was normal.  Localized mild inflammation characterized by congestion (edema) and       erythema was found in the cardia. Biopsies were taken with a cold       forceps for histology.      The exam was otherwise without abnormality. Impression:           - Normal esophagus.                       - Normal examined duodenum.                       - Gastritis. Biopsied.                       - The examination was otherwise normal. Recommendation:       - Await pathology results.                       - Discharge patient to home (with escort).                       - Resume previous diet.                       - Continue present medications.                       - Await pathology results.                       - Since nausea and vomiting resolved I will sign out.                        She can follow up with me as an outpatient . Procedure Code(s):    --- Professional ---                       [phone], Esophagogastroduodenoscopy, flexible, transoral;                        with biopsy, single or multiple Diagnosis Code(s):    --- Professional ---                       K29.70, Gastritis, unspecified, without bleeding                       R93.3, Abnormal findings on diagnostic imaging of other                        parts of digestive tract CPT copyright 2017 American Medical Association. All rights reserved. The codes documented in this report are preliminary and upon coder review may  be revised to meet current compliance requirements. [name], MD [name] MD, MD 06/27/2017 12:15:25  PM This report has been signed electronically. Number of Addenda: 0 Note Initiated On: 06/27/2017 12:02 PM      [facility]

## 2017-06-27 NOTE — Transfer of Care (Signed)
Immediate Anesthesia Transfer of Care Note  Patient: Meredith Martinez  Procedure(s) Performed: ESOPHAGOGASTRODUODENOSCOPY (EGD) WITH PROPOFOL (N/A )  Patient Location: PACU  Anesthesia Type:General  Level of Consciousness: awake and sedated  Airway & Oxygen Therapy: Patient Spontanous Breathing and Patient connected to nasal cannula oxygen  Post-op Assessment: Report given to RN and Post -op Vital signs reviewed and stable  Post vital signs: Reviewed and stable  Last Vitals:  Vitals Value Taken Time  BP    Temp    Pulse    Resp    SpO2      Last Pain:  Vitals:   06/27/17 1054  TempSrc:   PainSc: 1       Patients Stated Pain Goal: 0 (06/26/17 2337)  Complications: No apparent anesthesia complications

## 2017-06-27 NOTE — Discharge Instructions (Signed)
Fingerstick glucose (sugar) goals for home: Before meals: 80-130 mg/dl 2-Hours after meals: less than 180 mg/dl Hemoglobin Z6X goal: 7% or less  Symptoms of Hypoglycemia: Silly, Sweaty, Shaky Check sugar if you have your meter.  If near or less than 80 mg/dl, treat with 1/2 cup juice or soda or take glucose tablets Check sugar 15 minutes after treatment.  If sugar still near or less than 80 mg/al and symptomatic, treat again and may need a snack with some protein (peanut butter with crackers, etc)  Insulin Pen Instructions:  1. Remove Insulin pen cap and clean pen 1st with alcohol rub and then clean skin 2nd with alcohol rub 2. Twist insulin pen needle onto pen (right tighty) 3. Remove outer cap and inner cap from needle 4. Dial pen to 2 units and perform prime- press pen to zero and make sure liquid (insulin) comes out of the needle 5. Dial pen to your dose and perform injection into your abdomen 6. Hold needle in skin for 10 seconds after injection 7. Remove needle from insulin pen and discard 8. Place cap back on insulin pen and store safely (at room temperature) 9. Store unused pens in refrigerator and can keep opened insulin pen at room temperature (discard used pen after 30 days)   Gastritis, Adult Gastritis is swelling (inflammation) of the stomach. When you have this condition, you can have these problems (symptoms):  Pain in your stomach.  A burning feeling in your stomach.  Feeling sick to your stomach (nauseous).  Throwing up (vomiting).  Feeling too full after you eat.  It is important to get help for this condition. Without help, your stomach can bleed, and you can get sores (ulcers) in your stomach. Follow these instructions at home:  Take over-the-counter and prescription medicines only as told by your doctor.  If you were prescribed an antibiotic medicine, take it as told by your doctor. Do not stop taking it even if you start to feel better.  Drink enough  fluid to keep your pee (urine) clear or pale yellow.  Instead of eating big meals, eat small meals often. Contact a health care provider if:  Your problems get worse.  Your problems go away and then come back. Get help right away if:  You throw up blood or something that looks like coffee grounds.  You have black or dark red poop (stools).  You cannot keep fluids down.  Your stomach pain gets worse.  You have a fever.  You do not feel better after 1 week. This information is not intended to replace advice given to you by your health care provider. Make sure you discuss any questions you have with your health care provider. Document Released: 07/28/2007 Document Revised: 10/08/2015 Document Reviewed: 11/02/2014 Elsevier Interactive Patient Education  [employer].

## 2017-06-27 NOTE — Anesthesia Preprocedure Evaluation (Signed)
Anesthesia Evaluation  Patient identified by MRN, date of birth, ID band Patient awake    Reviewed: Allergy & Precautions, H&P , NPO status , Patient's Chart, lab work & pertinent test results  History of Anesthesia Complications Negative for: history of anesthetic complications  Airway Mallampati: III  TM Distance: <3 FB Neck ROM: limited    Dental  (+) Chipped, Poor Dentition, Missing   Pulmonary neg shortness of breath, former smoker,           Cardiovascular Exercise Tolerance: Good (-) angina(-) Past MI and (-) DOE negative cardio ROS       Neuro/Psych negative neurological ROS  negative psych ROS   GI/Hepatic Neg liver ROS, GERD  Medicated and Controlled,  Endo/Other  diabetes, Type 2  Renal/GU negative Renal ROS  negative genitourinary   Musculoskeletal   Abdominal   Peds  Hematology negative hematology ROS (+)   Anesthesia Other Findings Past Medical History: No date: Diabetes mellitus without complication (HCC)     Comment:  niddm  Past Surgical History: No date: BREAST BIOPSY; Right     Comment:  neg  BMI    Body Mass Index:  27.16 kg/m      Reproductive/Obstetrics negative OB ROS                             Anesthesia Physical Anesthesia Plan  ASA: III  Anesthesia Plan: General   Post-op Pain Management:    Induction: Intravenous  PONV Risk Score and Plan: Propofol infusion and TIVA  Airway Management Planned: Natural Airway and Nasal Cannula  Additional Equipment:   Intra-op Plan:   Post-operative Plan:   Informed Consent: I have reviewed the patients History and Physical, chart, labs and discussed the procedure including the risks, benefits and alternatives for the proposed anesthesia with the patient or authorized representative who has indicated his/her understanding and acceptance.   Dental Advisory Given  Plan Discussed with: Anesthesiologist,  CRNA and Surgeon  Anesthesia Plan Comments: (Patient consented for risks of anesthesia including but not limited to:  - adverse reactions to medications - risk of intubation if required - damage to teeth, lips or other oral mucosa - sore throat or hoarseness - Damage to heart, brain, lungs or loss of life  Patient voiced understanding.)        Anesthesia Quick Evaluation

## 2017-06-27 NOTE — H&P (Signed)
[name], MD [address], [address], [place], [place], [address] [address], [address], [place], [place], [address] Phone: [phone]  Fax: [phone]  Primary Care Physician:  Toy Cookey, FNP   Pre-Procedure History & Physical: HPI:  Meredith Martinez is a 67 y.o. female is here for an endoscopy    Past Medical History:  Diagnosis Date  . Diabetes mellitus without complication (HCC)    niddm    Past Surgical History:  Procedure Laterality Date  . BREAST BIOPSY Right    neg    Prior to Admission medications   Medication Sig Start Date End Date Taking? Authorizing Provider  acetaminophen (TYLENOL) 500 MG tablet Take 500 mg by mouth 2 (two) times daily. 2 tabs bid    [provider]  amitriptyline (ELAVIL) 25 MG tablet At bedtime 09/30/16   Barbette Merino, NP  Cholecalciferol (VITAMIN D-3) 1000 units CAPS Take by mouth daily.    [provider]  lisinopril (PRINIVIL,ZESTRIL) 10 MG tablet Take 10 mg by mouth daily.    [provider]  loratadine (CLARITIN) 10 MG tablet Take 10 mg by mouth daily.    [provider]  metFORMIN (GLUCOPHAGE) 500 MG tablet Take 500 mg by mouth 2 (two) times daily with a meal.    [provider]  montelukast (SINGULAIR) 10 MG tablet Take 10 mg by mouth at bedtime.    [provider]  naproxen sodium (ANAPROX) 220 MG tablet Take 220 mg by mouth at bedtime. 2 tabs at night    [provider]  pravastatin (PRAVACHOL) 40 MG tablet Take 40 mg by mouth daily.    [provider]  promethazine (PHENERGAN) 25 MG tablet Take 25 mg by mouth.    [provider]  vitamin B-12 (CYANOCOBALAMIN) 500 MCG tablet Take 500 mcg by mouth daily.    [provider]    Allergies as of 06/25/2017 - Review Complete 06/25/2017  Allergen Reaction Noted  . Amoxicillin Nausea And Vomiting 06/25/2017  . Iodine  06/25/2017    Family History  Problem Relation Age of Onset   . Heart disease Mother   . Diabetes Mother   . Arthritis Mother   . Cancer Mother   . Breast cancer Mother 14  . Heart disease Father   . Stroke Father   . Diabetes Father     Social History   Socioeconomic History  . Marital status: Single    Spouse name: Not on file  . Number of children: Not on file  . Years of education: Not on file  . Highest education level: Not on file  Occupational History  . Not on file  Social Needs  . Financial resource strain: Not on file  . Food insecurity:    Worry: Not on file    Inability: Not on file  . Transportation needs:    Medical: Not on file    Non-medical: Not on file  Tobacco Use  . Smoking status: Former Smoker    Last attempt to quit: 2008    Years since quitting: 11.3  . Smokeless tobacco: Never Used  Substance and Sexual Activity  . Alcohol use: No  . Drug use: No  . Sexual activity: Not on file  Lifestyle  . Physical activity:    Days per week: Not on file    Minutes per session: Not on file  . Stress: Not on file  Relationships  . Social connections:  Talks on phone: Not on file    Gets together: Not on file    Attends religious service: Not on file    Active member of club or organization: Not on file    Attends meetings of clubs or organizations: Not on file    Relationship status: Not on file  . Intimate partner violence:    Fear of current or ex partner: Not on file    Emotionally abused: Not on file    Physically abused: Not on file    Forced sexual activity: Not on file  Other Topics Concern  . Not on file  Social History Narrative  . Not on file    Review of Systems: See HPI, otherwise negative ROS  Physical Exam: BP (!) 145/55 (BP Location: Left Arm)   Pulse 65   Temp 98.4 F (36.9 C) (Oral)   Resp 16   Ht  (1.499 m)   Wt 134 lb 7.7 oz (61 kg)   SpO2 97%   BMI 27.16 kg/m  General:   Alert,  pleasant and cooperative in NAD Head:  Normocephalic and atraumatic. Neck:  Supple;  no masses or thyromegaly. Lungs:  Clear throughout to auscultation, normal respiratory effort.    Heart:  +S1, +S2, Regular rate and rhythm, No edema. Abdomen:  Soft, nontender and nondistended. Normal bowel sounds, without guarding, and without rebound.   Neurologic:  Alert and  oriented x4;  grossly normal neurologically.  Impression/Plan: Meredith Martinez is here for an endoscopy  to be performed for  evaluation of abnormal CT scan of the abdomen     Risks, benefits, limitations, and alternatives regarding endoscopy have been reviewed with the patient.  Questions have been answered.  All parties agreeable.   [name], MD  06/27/2017, 11:54 AM

## 2017-06-27 NOTE — Progress Notes (Signed)
Results for YVETT, ROSSEL (MRN [id number]) as of 06/27/2017 15:10  Ref. Range 06/25/2017 08:33  Hemoglobin A1C Latest Ref Range: 4.8 - 5.6 % 12.2 (H)    Met with pt today.  Spoke with patient about her current A1c of 12.2%.  Explained what an A1c is and what it measures.  Reminded patient that her goal A1c is 7% or less per ADA standards to prevent both acute and long-term complications.  Explained to patient the extreme importance of good glucose control at home.  Encouraged patient to check her CBGs at least once daily at home (fasting at least 3 times per week and/or before meals or 2 hours after meals) and to record all CBGs in a logbook for her PCP to review.  Discussed with pt that Dr. Posey Pronto will be discharging her home on Levemir.  Explained what Levemir is and how it works.  Explained to pt to take the Levemir once a day at the same time every day.  Educated patient on insulin pen use at home.  Reviewed contents of insulin flexpen starter kit.  Reviewed all steps of insulin pen including attachment of needle, 2-unit air shot, dialing up dose, giving injection, removing needle, disposal of sharps, storage of unused insulin, disposal of insulin etc.  Patient able to provide successful return demonstration.  Reviewed troubleshooting with insulin pen.  Also reviewed Signs/Symptoms of Hypoglycemia with patient and how to treat Hypoglycemia at home.     --Will follow patient during hospitalization--  Wyn Quaker RN, MSN, CDE Diabetes Coordinator Inpatient Glycemic Control Team Team Pager: [phone] (8a-5p)

## 2017-06-27 NOTE — Progress Notes (Signed)
Discharge instructions reviewed in detail with patient including new medications and blood glucose monitoring.  Understanding was verbalized and all questions were answered.  Patient discharged in stable condition ambulatory per patient request.

## 2017-06-27 NOTE — Anesthesia Procedure Notes (Signed)
Performed by: Cook-Martin, Gilberto Streck Pre-anesthesia Checklist: Patient identified, Emergency Drugs available, Suction available, Timeout performed and Patient being monitored Patient Re-evaluated:Patient Re-evaluated prior to induction Oxygen Delivery Method: Nasal cannula Preoxygenation: Pre-oxygenation with 100% oxygen Induction Type: IV induction Airway Equipment and Method: Bite block Placement Confirmation: positive ETCO2 and CO2 detector       

## 2017-06-28 ENCOUNTER — Encounter: Payer: Self-pay | Admitting: Gastroenterology

## 2017-06-28 LAB — SURGICAL PATHOLOGY

## 2017-06-29 NOTE — Anesthesia Postprocedure Evaluation (Signed)
Anesthesia Post Note  Patient: Meredith Martinez  Procedure(s) Performed: ESOPHAGOGASTRODUODENOSCOPY (EGD) WITH PROPOFOL (N/A )  Patient location during evaluation: Endoscopy Anesthesia Type: General Level of consciousness: awake and alert Pain management: pain level controlled Vital Signs Assessment: post-procedure vital signs reviewed and stable Respiratory status: spontaneous breathing, nonlabored ventilation and respiratory function stable Cardiovascular status: blood pressure returned to baseline and stable Postop Assessment: no apparent nausea or vomiting Anesthetic complications: no     Last Vitals:  Vitals:   06/27/17 1229 06/27/17 1311  BP: 134/69 (!) 120/54  Pulse:  (!) 56  Resp:  18  Temp:  36.4 C  SpO2:  100%    Last Pain:  Vitals:   06/27/17 1311  TempSrc: Oral  PainSc:                  Christia Reading

## 2017-07-02 ENCOUNTER — Encounter: Payer: Self-pay | Admitting: Gastroenterology

## 2017-10-06 DIAGNOSIS — M2550 Pain in unspecified joint: Secondary | ICD-10-CM | POA: Insufficient documentation

## 2017-10-06 DIAGNOSIS — E782 Mixed hyperlipidemia: Secondary | ICD-10-CM | POA: Insufficient documentation

## 2017-10-27 DIAGNOSIS — E1165 Type 2 diabetes mellitus with hyperglycemia: Secondary | ICD-10-CM | POA: Insufficient documentation

## 2017-10-27 DIAGNOSIS — I152 Hypertension secondary to endocrine disorders: Secondary | ICD-10-CM | POA: Insufficient documentation

## 2017-10-27 DIAGNOSIS — E1169 Type 2 diabetes mellitus with other specified complication: Secondary | ICD-10-CM | POA: Insufficient documentation

## 2017-11-28 ENCOUNTER — Encounter: Payer: Self-pay | Admitting: *Deleted

## 2017-11-28 ENCOUNTER — Other Ambulatory Visit: Payer: Self-pay

## 2017-11-28 ENCOUNTER — Emergency Department: Payer: Medicare Other

## 2017-11-28 ENCOUNTER — Emergency Department
Admission: EM | Admit: 2017-11-28 | Discharge: 2017-11-28 | Disposition: A | Discharge status: Home / Self Care | Payer: Medicare Other | Attending: Emergency Medicine | Admitting: Emergency Medicine

## 2017-11-28 DIAGNOSIS — Z794 Long term (current) use of insulin: Secondary | ICD-10-CM | POA: Diagnosis not present

## 2017-11-28 DIAGNOSIS — E119 Type 2 diabetes mellitus without complications: Secondary | ICD-10-CM | POA: Diagnosis not present

## 2017-11-28 DIAGNOSIS — Z87891 Personal history of nicotine dependence: Secondary | ICD-10-CM | POA: Insufficient documentation

## 2017-11-28 DIAGNOSIS — Z79899 Other long term (current) drug therapy: Secondary | ICD-10-CM | POA: Diagnosis not present

## 2017-11-28 DIAGNOSIS — M25551 Pain in right hip: Secondary | ICD-10-CM | POA: Diagnosis present

## 2017-11-28 DIAGNOSIS — Y998 Other external cause status: Secondary | ICD-10-CM | POA: Insufficient documentation

## 2017-11-28 DIAGNOSIS — M79644 Pain in right finger(s): Secondary | ICD-10-CM | POA: Diagnosis not present

## 2017-11-28 DIAGNOSIS — Y929 Unspecified place or not applicable: Secondary | ICD-10-CM | POA: Diagnosis not present

## 2017-11-28 DIAGNOSIS — Y9389 Activity, other specified: Secondary | ICD-10-CM | POA: Diagnosis not present

## 2017-11-28 DIAGNOSIS — W19XXXA Unspecified fall, initial encounter: Secondary | ICD-10-CM

## 2017-11-28 DIAGNOSIS — W010XXA Fall on same level from slipping, tripping and stumbling without subsequent striking against object, initial encounter: Secondary | ICD-10-CM | POA: Diagnosis not present

## 2017-11-28 MED ORDER — HYDROCODONE-ACETAMINOPHEN 5-325 MG PO TABS: 1.0000 | ORAL_TABLET | Freq: Four times a day (QID) | ORAL | 0 refills | Status: AC | PRN

## 2017-11-28 MED ORDER — MELOXICAM 7.5 MG PO TABS: 7.5000 mg | ORAL_TABLET | Freq: Every day | ORAL | Status: DC

## 2017-11-28 MED ORDER — LIDOCAINE HCL 1 % IJ SOLN
5.0000 mL | Freq: Once | INTRAMUSCULAR | Status: AC
  Administered 2017-11-28: 5 mL

## 2017-11-28 MED ORDER — HYDROCODONE-ACETAMINOPHEN 5-325 MG PO TABS
1.0000 | ORAL_TABLET | Freq: Once | ORAL | Status: AC
  Administered 2017-11-28: 1 via ORAL
  Filled 2017-11-28: qty 1

## 2017-11-28 MED ORDER — LIDOCAINE HCL (PF) 1 % IJ SOLN
INTRAMUSCULAR | Status: AC
  Filled 2017-11-28: qty 5

## 2017-11-28 NOTE — ED Notes (Signed)
Pt states that she was walking her dog, he went around a light pole and she tried switch hands when her shoes went into a ditch and she fell sideways on the right side of her body. Laceration on right hand, wrist, elbow, and hip.

## 2017-11-28 NOTE — ED Provider Notes (Signed)
[facility] Emergency Department Provider Note  ____________________________________________  Time seen: Approximately 11:13 PM  I have reviewed the triage vital signs and the nursing notes.   HISTORY  Chief Complaint Fall    HPI Quintara Bost is a 67 y.o. female presents to the emergency department with right hip pain and right fourth digit pain after patient tripped and fell while walking her dog tonight.  Patient reports that she did not hit her head or lose consciousness.  Patient reports that she has a baseline deformity of the right wrist that she has had for several years.  She denies new onset numbness or tingling in the right hand.  Patient has been able to bear weight and ambulate in the emergency department.  Patient has abrasions over the right wrist and right forearm.  No alleviating measures of been attempted.   Past Medical History:  Diagnosis Date  . Diabetes mellitus without complication (HCC)    niddm    Patient Active Problem List   Diagnosis Date Noted  . Gastroparesis 06/25/2017  . Chronic Lower extremity pain  (Secondary Area of Pain) (bilateral) (L>R) 09/30/2016  . Chronic pain syndrome 09/30/2016  . Chronic neck pain(Primary Area of Pain) (Bilateral) (L>R) 09/30/2016  . Long term current use of opiate analgesic 09/30/2016  . Long term prescription opiate use 09/30/2016  . Opiate use 09/30/2016  . Chronic midline low back pain without sciatica [facility] Area of Pain) (L>R) 09/30/2016  . Neurogenic pain 09/30/2016  . Sacroiliac joint pain 09/30/2016    Past Surgical History:  Procedure Laterality Date  . BREAST BIOPSY Right    neg  . ESOPHAGOGASTRODUODENOSCOPY (EGD) WITH PROPOFOL N/A 06/27/2017   Procedure: ESOPHAGOGASTRODUODENOSCOPY (EGD) WITH PROPOFOL;  Surgeon: [name], MD;  Location: [facility] ENDOSCOPY;  Service: Gastroenterology;  Laterality: N/A;    Prior to Admission medications   Medication Sig Start Date End Date  Taking? Authorizing Provider  acetaminophen (TYLENOL) 500 MG tablet Take 500 mg by mouth 2 (two) times daily. 2 tabs bid    [provider]  amitriptyline (ELAVIL) 25 MG tablet At bedtime 09/30/16   Barbette Merino, NP  Cholecalciferol (VITAMIN D-3) 1000 units CAPS Take by mouth daily.    [provider]  HYDROcodone-acetaminophen (NORCO) 5-325 MG tablet Take 1 tablet by mouth every 6 (six) hours as needed for up to 3 days for moderate pain. 11/28/17 12/01/17  Orvil Feil, PA-C  Insulin Detemir (LEVEMIR FLEXTOUCH) 100 UNIT/ML Pen Inject 12 Units into the skin daily at 10 pm. 06/27/17   Auburn Bilberry, MD  lisinopril (PRINIVIL,ZESTRIL) 10 MG tablet Take 10 mg by mouth daily.    [provider]  loratadine (CLARITIN) 10 MG tablet Take 10 mg by mouth daily.    [provider]  montelukast (SINGULAIR) 10 MG tablet Take 10 mg by mouth at bedtime.    [provider]  naproxen sodium (ANAPROX) 220 MG tablet Take 220 mg by mouth at bedtime. 2 tabs at night    [provider]  ondansetron (ZOFRAN) 4 MG tablet Take 1 tablet (4 mg total) by mouth every 8 (eight) hours as needed for nausea or vomiting. 06/27/17 06/27/18  Auburn Bilberry, MD  pravastatin (PRAVACHOL) 40 MG tablet Take 40 mg by mouth daily.    [provider]  promethazine (PHENERGAN) 25 MG tablet Take 25 mg by mouth.    [provider]  vitamin B-12 (CYANOCOBALAMIN) 500 MCG tablet Take 500 mcg by mouth daily.  [provider]    Allergies Amoxicillin and Iodine  Family History  Problem Relation Age of Onset  . Heart disease Mother   . Diabetes Mother   . Arthritis Mother   . Cancer Mother   . Breast cancer Mother 39  . Heart disease Father   . Stroke Father   . Diabetes Father     Social History Social History   Tobacco Use  . Smoking status: Former Smoker    Last attempt to quit: 2008    Years since quitting: 11.7  . Smokeless tobacco: Never Used   Substance Use Topics  . Alcohol use: No  . Drug use: No     Review of Systems  Constitutional: No fever/chills Eyes: No visual changes. No discharge ENT: No upper respiratory complaints. Cardiovascular: no chest pain. Respiratory: no cough. No SOB. Gastrointestinal: No abdominal pain.  No nausea, no vomiting.  No diarrhea.  No constipation. Genitourinary: Negative for dysuria. No hematuria Musculoskeletal: Patient has right wrist pain and right hip pain. Skin: Negative for rash, abrasions, lacerations, ecchymosis. Neurological: Negative for headaches, focal weakness or numbness.   ____________________________________________   PHYSICAL EXAM:  VITAL SIGNS: ED Triage Vitals  Enc Vitals Group     BP 11/28/17 2023 (!) 161/78     Pulse Rate 11/28/17 2023 70     Resp 11/28/17 2023 20     Temp 11/28/17 2023 97.7 F (36.5 C)     Temp Source 11/28/17 2023 Oral     SpO2 11/28/17 2023 99 %     Weight 11/28/17 2024 130 lb (59 kg)     Height 11/28/17 2024 4\' 11"  (1.499 m)     Head Circumference --      Peak Flow --      Pain Score 11/28/17 2024 10     Pain Loc --      Pain Edu? --      Excl. in GC? --      Constitutional: Alert and oriented. Well appearing and in no acute distress. Eyes: Conjunctivae are normal. PERRL. EOMI. Head: Atraumatic. ENT:      Ears: TMs are pearly.      Nose: No congestion/rhinnorhea.      Mouth/Throat: Mucous membranes are moist.  Neck: No stridor.  No cervical spine tenderness to palpation.  Full range of motion. Cardiovascular: Normal rate, regular rhythm. Normal S1 and S2.  Good peripheral circulation. Respiratory: Normal respiratory effort without tachypnea or retractions. Lungs CTAB. Good air entry to the bases with no decreased or absent breath sounds. Gastrointestinal: Bowel sounds 4 quadrants. Soft and nontender to palpation. No guarding or rigidity. No palpable masses. No distention. No CVA tenderness. Musculoskeletal: Patient is  unable to perform full range of motion at the right wrist due to baseline.  Patient's right fourth digit is laterally deviated which is new.  Full range of motion at the right elbow and right shoulder.  Full range of motion at the right hip.  Patient does have groin pain with range of motion testing.  Palpable radial and dorsalis pedis pulses bilaterally and symmetrically. Neurologic:  Normal speech and language. No gross focal neurologic deficits are appreciated.  Skin: Abrasions are localized to the dorsal aspect of right wrist and forearm. Psychiatric: Mood and affect are normal. Speech and behavior are normal. Patient exhibits appropriate insight and judgement.   ____________________________________________   LABS (all labs ordered are listed, but only abnormal results are displayed)  Labs Reviewed - No data to  display ____________________________________________  EKG   ____________________________________________  RADIOLOGY I personally viewed and evaluated these images as part of my medical decision making, as well as reviewing the written report by the radiologist.  Dg Hand Complete Right  Result Date: 11/28/2017 CLINICAL DATA:  Patient fell while walking the dog. Abrasions to the right arm and hand. EXAM: RIGHT HAND - COMPLETE 3+ VIEW COMPARISON:  12/21/2005 FINDINGS: Diffuse bone demineralization. There is a mostly transverse fracture of the proximal aspect proximal phalanx right fourth finger with ulnar side angulation and radial side displacement of the distal fracture fragment. Dorsal angulation as well. No articular involvement is indicated. No additional fractures are seen. Degenerative changes throughout the interphalangeal joints, first metacarpal phalangeal joint, and radiocarpal and STT joints. No destructive or expansile bone lesions. Soft tissues are unremarkable. IMPRESSION: Acute posttraumatic fracture of the proximal phalanx right fourth finger. Electronically Signed    By: Burman Nieves M.D.   On: 11/28/2017 21:16   Dg Hip Unilat  With Pelvis 2-3 Views Right  Result Date: 11/28/2017 CLINICAL DATA:  Patient fell while walking the dog. Abrasions to the right arm and hand. Right hip pain. EXAM: DG HIP (WITH OR WITHOUT PELVIS) 2-3V RIGHT COMPARISON:  None. FINDINGS: Mild degenerative changes in the lower lumbar spine and hips. Pelvis and right hip appear intact. No evidence of acute fracture or dislocation. No focal bone lesion or bone destruction. Bone cortex appears intact. SI joints and symphysis pubis are not displaced. Soft tissues are unremarkable. IMPRESSION: Mild degenerative changes. No acute bony abnormalities. Electronically Signed   By: Burman Nieves M.D.   On: 11/28/2017 21:14    ____________________________________________    PROCEDURES  Procedure(s) performed:    Procedures  Patient's right fourth digit was anesthetized using lidocaine 1% without epinephrine.  Reduction occurred using medial traction.  Medications  lidocaine (PF) (XYLOCAINE) 1 % injection (has no administration in time range)  meloxicam (MOBIC) tablet 7.5 mg (has no administration in time range)  lidocaine (XYLOCAINE) 1 % (with pres) injection 5 mL (5 mLs Infiltration Given 11/28/17 2259)  HYDROcodone-acetaminophen (NORCO/VICODIN) 5-325 MG per tablet 1 tablet (1 tablet Oral Given 11/28/17 2258)     ____________________________________________   INITIAL IMPRESSION / ASSESSMENT AND PLAN / ED COURSE  Pertinent labs & imaging results that were available during my care of the patient were reviewed by me and considered in my medical decision making (see chart for details).  Review of the [place] CSRS was performed in accordance of the [facility] prior to dispensing any controlled drugs.      Assessment and plan Fall Patient presents to the emergency department with right fourth digit pain and right hip pain.  No acute fractures were identified on x-ray examination of the  right hip and pelvis.  Patient did have new deformity of right fourth digit on physical exam and x-ray examination confirms displaced fracture at the proximal phalanx.  Patient underwent reduction in the emergency department and splint was applied.  Patient was advised to follow-up with Dr. Stephenie Acres.  Norco was given for pain patient was discharged with a brief course of Norco.  Patient had family member with her prior to discharge.    ____________________________________________  FINAL CLINICAL IMPRESSION(S) / ED DIAGNOSES  Final diagnoses:  Fall, initial encounter      NEW MEDICATIONS STARTED DURING THIS VISIT:  ED Discharge Orders         Ordered    HYDROcodone-acetaminophen (NORCO) 5-325 MG tablet  Every 6 hours PRN  11/28/17 2255              This chart was dictated using voice recognition software/Dragon. Despite best efforts to proofread, errors can occur which can change the meaning. Any change was purely unintentional.    Orvil Feil, PA-C 11/28/17 2318    Sharman Cheek, MD 12/01/17 [phone]

## 2017-11-28 NOTE — ED Triage Notes (Signed)
Pt fell walking the dog tonight.  Pt has abrasions to right arm and hand. Pt also has right hip pain.  Pt alert.

## 2018-03-16 DIAGNOSIS — E1140 Type 2 diabetes mellitus with diabetic neuropathy, unspecified: Secondary | ICD-10-CM | POA: Insufficient documentation

## 2018-03-16 DIAGNOSIS — G8929 Other chronic pain: Secondary | ICD-10-CM | POA: Insufficient documentation

## 2018-04-02 DIAGNOSIS — E1142 Type 2 diabetes mellitus with diabetic polyneuropathy: Secondary | ICD-10-CM | POA: Insufficient documentation

## 2018-05-05 DIAGNOSIS — M792 Neuralgia and neuritis, unspecified: Secondary | ICD-10-CM | POA: Insufficient documentation

## 2018-11-04 ENCOUNTER — Other Ambulatory Visit: Payer: Self-pay

## 2018-11-04 DIAGNOSIS — Z87891 Personal history of nicotine dependence: Secondary | ICD-10-CM | POA: Diagnosis not present

## 2018-11-04 DIAGNOSIS — R1110 Vomiting, unspecified: Secondary | ICD-10-CM | POA: Diagnosis present

## 2018-11-04 DIAGNOSIS — E119 Type 2 diabetes mellitus without complications: Secondary | ICD-10-CM | POA: Insufficient documentation

## 2018-11-04 DIAGNOSIS — Z79899 Other long term (current) drug therapy: Secondary | ICD-10-CM | POA: Insufficient documentation

## 2018-11-04 DIAGNOSIS — K2900 Acute gastritis without bleeding: Secondary | ICD-10-CM | POA: Diagnosis not present

## 2018-11-04 LAB — URINALYSIS, COMPLETE (UACMP) WITH MICROSCOPIC
Bacteria, UA: NONE SEEN
Bilirubin Urine: NEGATIVE
Glucose, UA: >=500 mg/dL — AB
Ketones, ur: NEGATIVE mg/dL
Nitrite: NEGATIVE
Protein, ur: NEGATIVE mg/dL
Specific Gravity, Urine: 1.031 — ABNORMAL HIGH (ref 1.005–1.030)
pH: 5 (ref 5.0–8.0)

## 2018-11-04 LAB — CBC
HCT: 41.4 % (ref 36.0–46.0)
Hemoglobin: 13.7 g/dL (ref 12.0–15.0)
MCH: 29.4 pg (ref 26.0–34.0)
MCHC: 33.1 g/dL (ref 30.0–36.0)
MCV: 88.8 fL (ref 80.0–100.0)
Platelets: 722 10*3/uL — ABNORMAL HIGH (ref 150–400)
RBC: 4.66 MIL/uL (ref 3.87–5.11)
RDW: 12.4 % (ref 11.5–15.5)
WBC: 8.4 10*3/uL (ref 4.0–10.5)
nRBC: 0 % (ref 0.0–0.2)

## 2018-11-04 LAB — LIPASE, BLOOD: Lipase: 25 U/L (ref 11–51)

## 2018-11-04 LAB — COMPREHENSIVE METABOLIC PANEL
ALT: 26 U/L (ref 0–44)
AST: 22 U/L (ref 15–41)
Albumin: 3.9 g/dL (ref 3.5–5.0)
Alkaline Phosphatase: 115 U/L (ref 38–126)
Anion gap: 11 (ref 5–15)
BUN: 13 mg/dL (ref 8–23)
CO2: 26 mmol/L (ref 22–32)
Calcium: 9.5 mg/dL (ref 8.9–10.3)
Chloride: 95 mmol/L — ABNORMAL LOW (ref 98–111)
Creatinine, Ser: 0.62 mg/dL (ref 0.44–1.00)
GFR calc Af Amer: >60 mL/min (ref >60)
GFR calc non Af Amer: >60 mL/min (ref >60)
Glucose, Bld: 375 mg/dL — ABNORMAL HIGH (ref 70–99)
Potassium: 4.2 mmol/L (ref 3.5–5.1)
Sodium: 132 mmol/L — ABNORMAL LOW (ref 135–145)
Total Bilirubin: 0.7 mg/dL (ref 0.3–1.2)
Total Protein: 8.3 g/dL — ABNORMAL HIGH (ref 6.5–8.1)

## 2018-11-04 NOTE — ED Triage Notes (Signed)
Pt states generalized abd pain with vomiting since yesterday. Pt with positive covid 19 test 3 weeks ago. Pt states she accidentally took 100mg  of tramadol on Friday instead of 50mg  and has had pain in abd since. Pt appears in no acute distress.

## 2018-11-05 ENCOUNTER — Emergency Department
Admission: EM | Admit: 2018-11-05 | Discharge: 2018-11-05 | Disposition: A | Discharge status: Home / Self Care | Payer: Medicare Other | Attending: Emergency Medicine | Admitting: Emergency Medicine

## 2018-11-05 DIAGNOSIS — K2900 Acute gastritis without bleeding: Secondary | ICD-10-CM | POA: Diagnosis not present

## 2018-11-05 MED ORDER — FAMOTIDINE 40 MG PO TABS: 40.0000 mg | ORAL_TABLET | Freq: Every evening | ORAL | 1 refills | Status: DC

## 2018-11-05 MED ORDER — LIDOCAINE VISCOUS HCL 2 % MT SOLN
15.0000 mL | Freq: Once | OROMUCOSAL | Status: AC
  Administered 2018-11-05: 15 mL via ORAL
  Filled 2018-11-05: qty 15

## 2018-11-05 MED ORDER — ALUM & MAG HYDROXIDE-SIMETH 200-200-20 MG/5ML PO SUSP
30.0000 mL | Freq: Once | ORAL | Status: AC
  Administered 2018-11-05: 30 mL via ORAL
  Filled 2018-11-05: qty 30

## 2018-11-05 MED ORDER — SUCRALFATE 1 G PO TABS: 1.0000 g | ORAL_TABLET | Freq: Four times a day (QID) | ORAL | 0 refills | Status: DC

## 2018-11-05 NOTE — Discharge Instructions (Addendum)
Please seek medical attention for any high fevers, chest pain, shortness of breath, change in behavior, persistent vomiting, bloody stool or any other new or concerning symptoms.  

## 2018-11-05 NOTE — ED Provider Notes (Signed)
[facility] Emergency Department Provider Note  ____________________________________________   I have reviewed the triage vital signs and the nursing notes.   HISTORY  Chief Complaint Abdominal Pain   History limited by: Not Limited   HPI Meredith Martinez is a 68 y.o. female who presents to the emergency department today because of concern for abdominal pain and vomiting. The patient states that she has had abdominal pain and nausea for the past few days. The patient says that she has ad decreased oral intake. She has been drinking a lot of lemonade and milk. The patient was somewhat worried that this might be related to having taken two of her pain medications a couple of days ago but the pain did start before this. The patient was diagnosed with covid a little over three weeks ago.    Records reviewed. Per medical record review patient has a history of DM.  Past Medical History:  Diagnosis Date  . Diabetes mellitus without complication (HCC)    niddm    Patient Active Problem List   Diagnosis Date Noted  . Gastroparesis 06/25/2017  . Chronic Lower extremity pain  (Secondary Area of Pain) (bilateral) (L>R) 09/30/2016  . Chronic pain syndrome 09/30/2016  . Chronic neck pain(Primary Area of Pain) (Bilateral) (L>R) 09/30/2016  . Long term current use of opiate analgesic 09/30/2016  . Long term prescription opiate use 09/30/2016  . Opiate use 09/30/2016  . Chronic midline low back pain without sciatica [facility] Area of Pain) (L>R) 09/30/2016  . Neurogenic pain 09/30/2016  . Sacroiliac joint pain 09/30/2016    Past Surgical History:  Procedure Laterality Date  . BREAST BIOPSY Right    neg  . ESOPHAGOGASTRODUODENOSCOPY (EGD) WITH PROPOFOL N/A 06/27/2017   Procedure: ESOPHAGOGASTRODUODENOSCOPY (EGD) WITH PROPOFOL;  Surgeon: [name], MD;  Location: [facility] ENDOSCOPY;  Service: Gastroenterology;  Laterality: N/A;    Prior to Admission medications    Medication Sig Start Date End Date Taking? Authorizing Provider  acetaminophen (TYLENOL) 500 MG tablet Take 500 mg by mouth 2 (two) times daily. 2 tabs bid    [provider]  amitriptyline (ELAVIL) 25 MG tablet At bedtime 09/30/16   Barbette Merino, NP  Cholecalciferol (VITAMIN D-3) 1000 units CAPS Take by mouth daily.    [provider]  Insulin Detemir (LEVEMIR FLEXTOUCH) 100 UNIT/ML Pen Inject 12 Units into the skin daily at 10 pm. 06/27/17   Auburn Bilberry, MD  lisinopril (PRINIVIL,ZESTRIL) 10 MG tablet Take 10 mg by mouth daily.    [provider]  loratadine (CLARITIN) 10 MG tablet Take 10 mg by mouth daily.    [provider]  montelukast (SINGULAIR) 10 MG tablet Take 10 mg by mouth at bedtime.    [provider]  naproxen sodium (ANAPROX) 220 MG tablet Take 220 mg by mouth at bedtime. 2 tabs at night    [provider]  pravastatin (PRAVACHOL) 40 MG tablet Take 40 mg by mouth daily.    [provider]  promethazine (PHENERGAN) 25 MG tablet Take 25 mg by mouth.    [provider]  vitamin B-12 (CYANOCOBALAMIN) 500 MCG tablet Take 500 mcg by mouth daily.    [provider]    Allergies Amoxicillin and Iodine  Family History  Problem Relation Age of Onset  . Heart disease Mother   . Diabetes Mother   . Arthritis Mother   . Cancer Mother   . Breast cancer Mother 64  . Heart disease Father   .  Stroke Father   . Diabetes Father     Social History Social History   Tobacco Use  . Smoking status: Former Smoker    Quit date: 2008    Years since quitting: 12.7  . Smokeless tobacco: Never Used  Substance Use Topics  . Alcohol use: No  . Drug use: No    Review of Systems Constitutional: No fever/chills Eyes: No visual changes. ENT: No sore throat. Cardiovascular: Denies chest pain. Respiratory: Denies shortness of breath. Gastrointestinal: Positive for abdominal pain, nausea and vomiting.     Genitourinary: Negative for dysuria. Musculoskeletal: Negative for back pain. Skin: Negative for rash. Neurological: Negative for headaches, focal weakness or numbness.  ____________________________________________   PHYSICAL EXAM:  VITAL SIGNS: ED Triage Vitals [11/04/18 2018]  Enc Vitals Group     BP (!) 112/53     Pulse Rate 76     Resp 16     Temp 98.3 F (36.8 C)     Temp Source Oral     SpO2 100 %     Weight 120 lb (54.4 kg)     Height 4' 11.75" (1.518 m)     Head Circumference      Peak Flow      Pain Score 6    Constitutional: Alert and oriented.  Eyes: Conjunctivae are normal.  ENT      Head: Normocephalic and atraumatic.      Nose: No congestion/rhinnorhea.      Mouth/Throat: Mucous membranes are moist.      Neck: No stridor. Hematological/Lymphatic/Immunilogical: No cervical lymphadenopathy. Cardiovascular: Normal rate, regular rhythm.  No murmurs, rubs, or gallops.  Respiratory: Normal respiratory effort without tachypnea nor retractions. Breath sounds are clear and equal bilaterally. No wheezes/rales/rhonchi. Gastrointestinal: Soft and non tender. No rebound. No guarding.  Genitourinary: Deferred Musculoskeletal: Normal range of motion in all extremities. No lower extremity edema. Neurologic:  Normal speech and language. No gross focal neurologic deficits are appreciated.  Skin:  Skin is warm, dry and intact. No rash noted. Psychiatric: Mood and affect are normal. Speech and behavior are normal. Patient exhibits appropriate insight and judgment.  ____________________________________________    LABS (pertinent positives/negatives)  Lipase 25 CMP na 132, k 4.2, glu 375, cr 0.62 CBC wbc 8.4, hgb 13.7, plt 722 UA clear, > 500 glucose, small hgb dipstick, trace leukocytes, 6-10 wbc  ____________________________________________   EKG  I, Nance Pear, attending physician, personally viewed and interpreted this EKG  EKG Time: 2031 Rate:  79 Rhythm: normal sinus rhythm Axis: normal Intervals: qtc 463 QRS: low voltage qrs ST changes: no st elevation Impression: abnormal ekg   ____________________________________________    RADIOLOGY  None  ____________________________________________   PROCEDURES  Procedures  ____________________________________________   INITIAL IMPRESSION / ASSESSMENT AND PLAN / ED COURSE  Pertinent labs & imaging results that were available during my care of the patient were reviewed by me and considered in my medical decision making (see chart for details).   Patient presented to the emergency department today because of concern for abdominal pain and nausea. Patient does admit to drinking large amount of lemonade. Work up without concerning leukocytosis. Exam without any significant tenderness, rebound or guarding. The patient did feel better after GI cocktail. Do think likely patient suffering from gastritis. Discussed this with the patient. Discussed that she should stop drinking large volumes of lemonade. Will give patient prescription for antacid and sucralfate. Will give patient information of dietary change.   ____________________________________________   FINAL CLINICAL IMPRESSION(S) /  ED DIAGNOSES  Final diagnoses:  Acute gastritis, presence of bleeding unspecified, unspecified gastritis type     Note: This dictation was prepared with Dragon dictation. Any transcriptional errors that result from this process are unintentional     Phineas SemenGoodman, Jalaine Riggenbach, MD 11/05/18 [phone]

## 2019-03-26 ENCOUNTER — Ambulatory Visit: Payer: Self-pay | Admitting: Family Medicine

## 2019-04-09 IMAGING — CT CT ABD-PELV W/O CM
2 of 4 series · 16 of 46 positions shown, 18 images · non-contrast
Comparison: None.

CLINICAL DATA: Abdominal pain, nausea and vomiting.

EXAM:
CT ABDOMEN AND PELVIS WITHOUT CONTRAST
TECHNIQUE: Multidetector CT imaging of the abdomen and pelvis was performed
following the standard protocol without IV contrast.

[Series 2: routine abd/pel wo · axial · 0.63mm/px · z∈[-880,-505]mm · 13 of 83 slices shown, 15 images]
[im 4/83  soft-tissue]
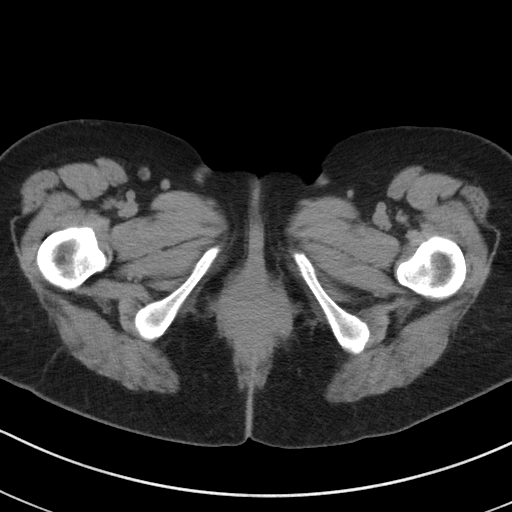
[im 4/83  bone]
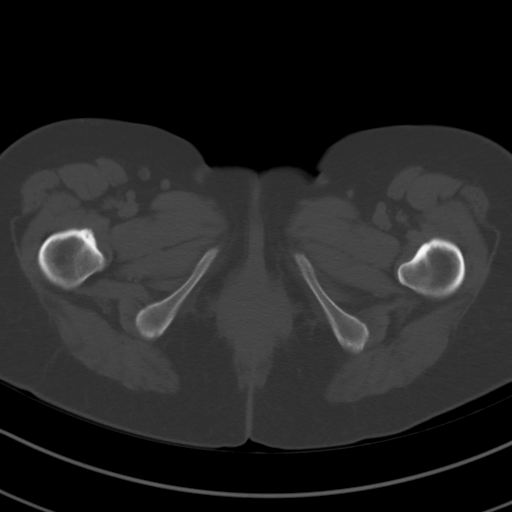
[im 10/83  soft-tissue]
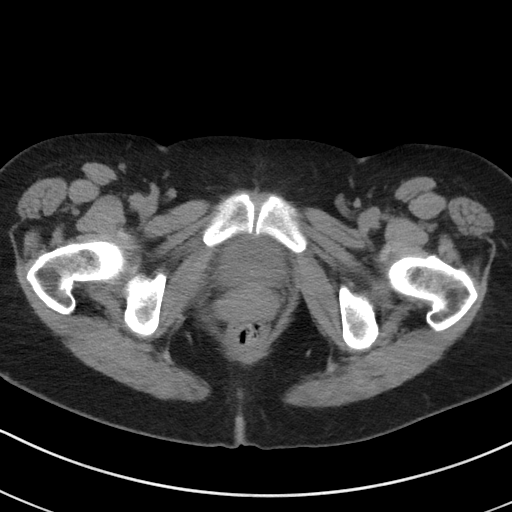
[im 17/83  soft-tissue]
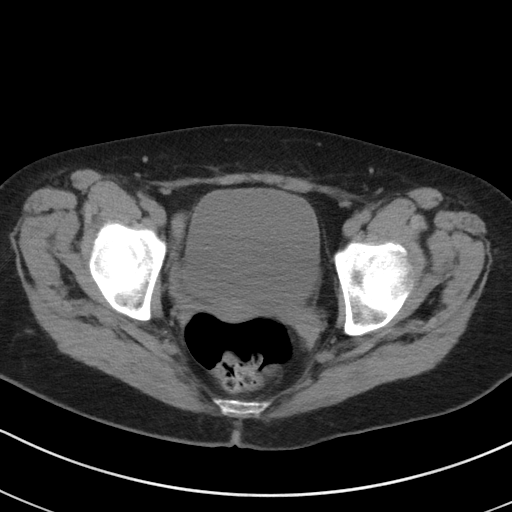
[im 23/83  soft-tissue]
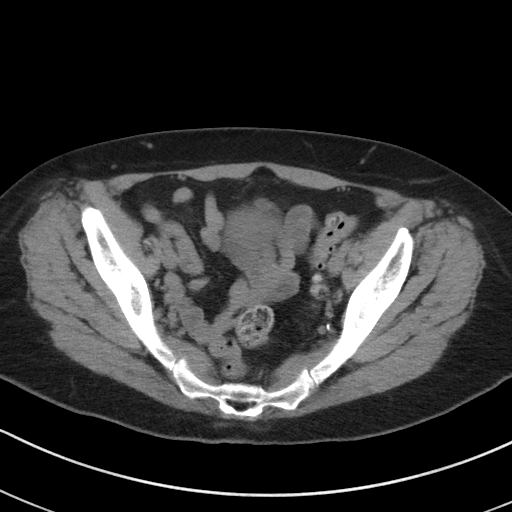
[im 30/83  soft-tissue]
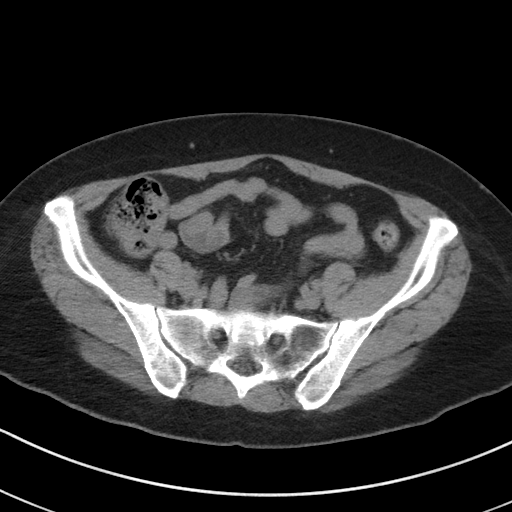
[im 37/83  soft-tissue]
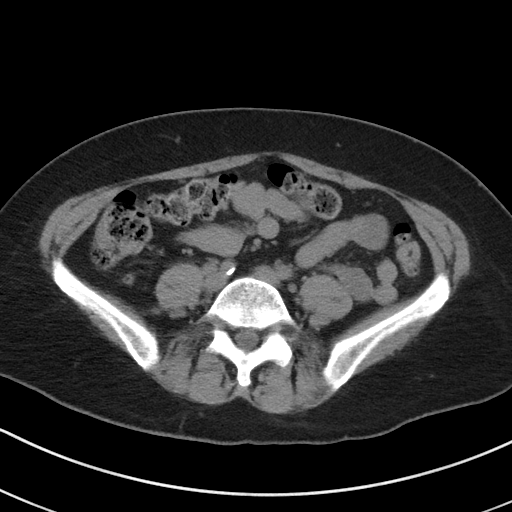
[im 43/83  soft-tissue]
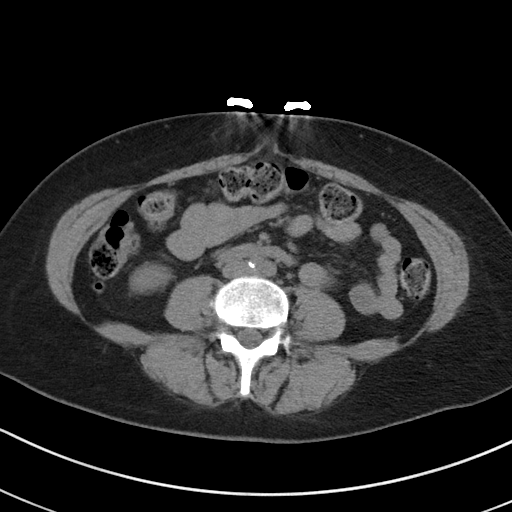
[im 46/83  soft-tissue]
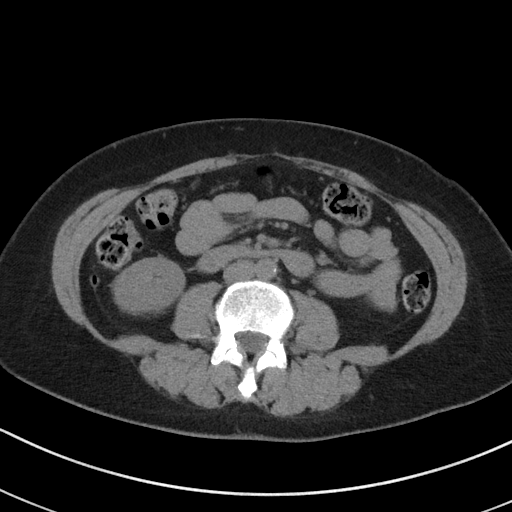
[im 53/83  soft-tissue]
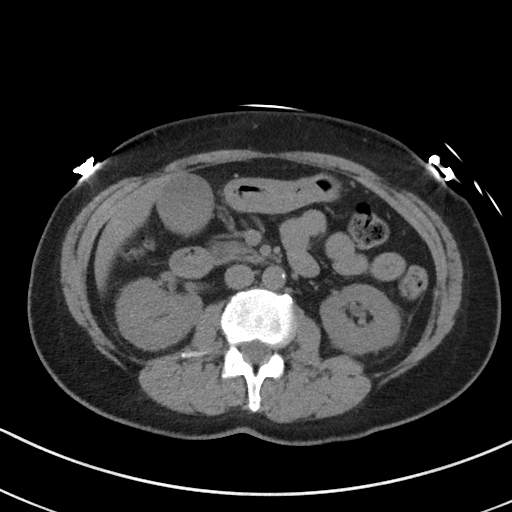
[im 53/83  bone]
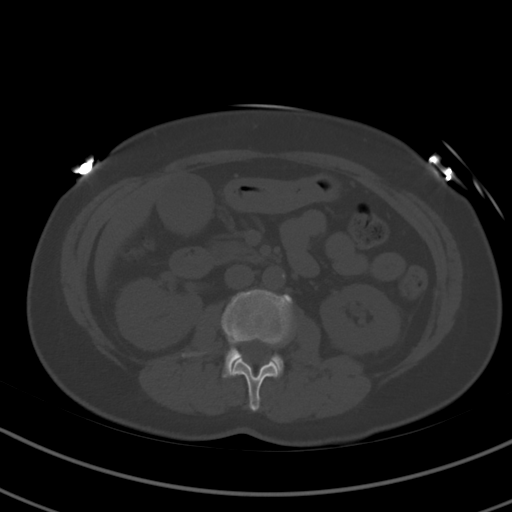
[im 60/83  soft-tissue]
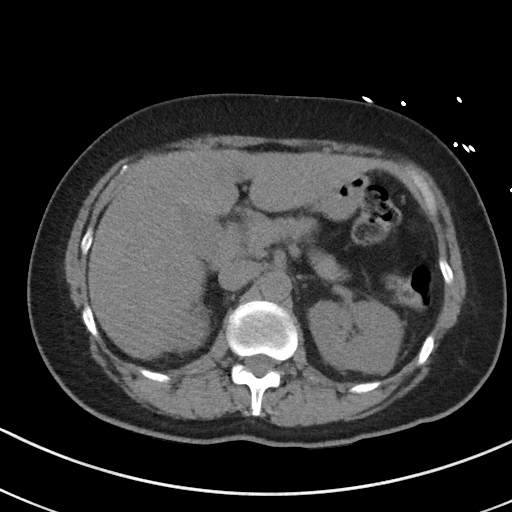
[im 66/83  soft-tissue]
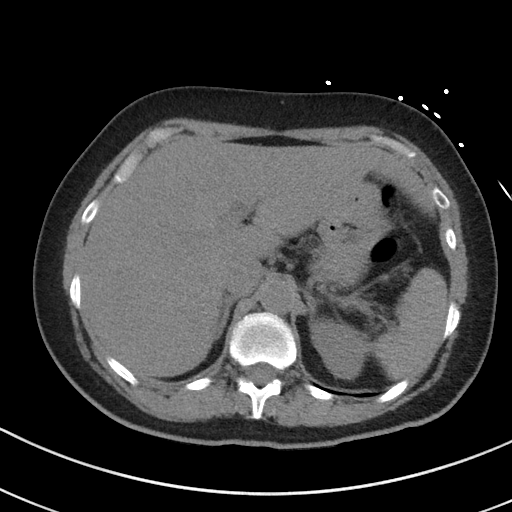
[im 73/83  soft-tissue]
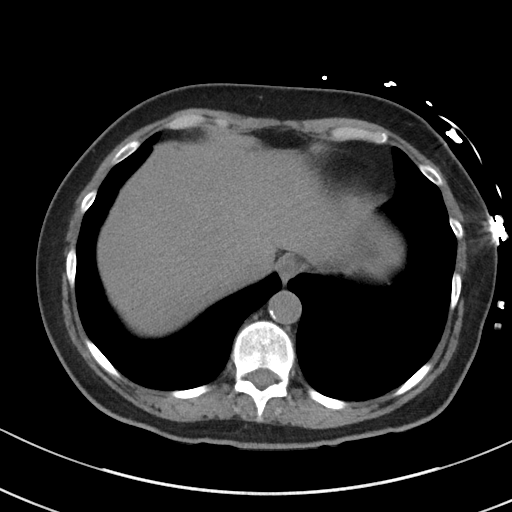
[im 79/83  soft-tissue]
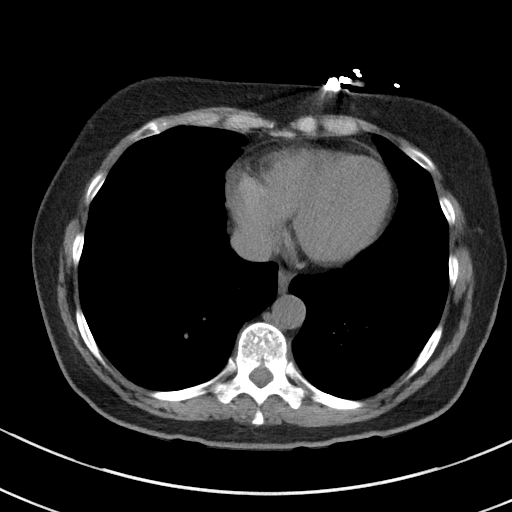

[Series 5: coronal st · coronal · 0.62mm/px · 3 of 67 slices shown]
[im 23/67  soft-tissue]
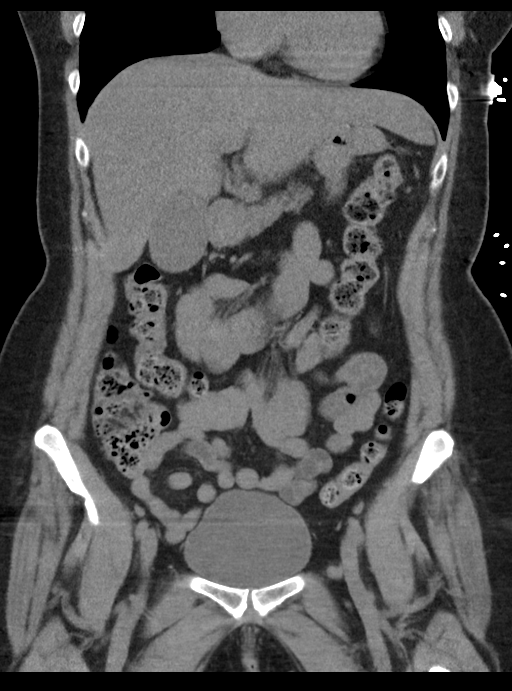
[im 30/67  soft-tissue]
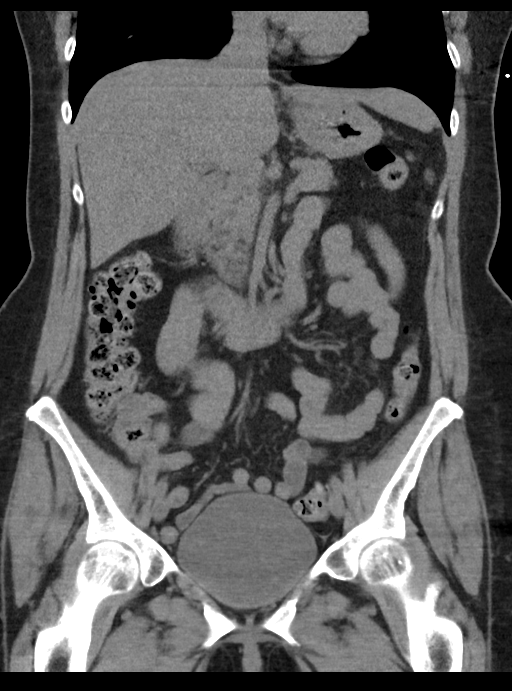
[im 37/67  soft-tissue]
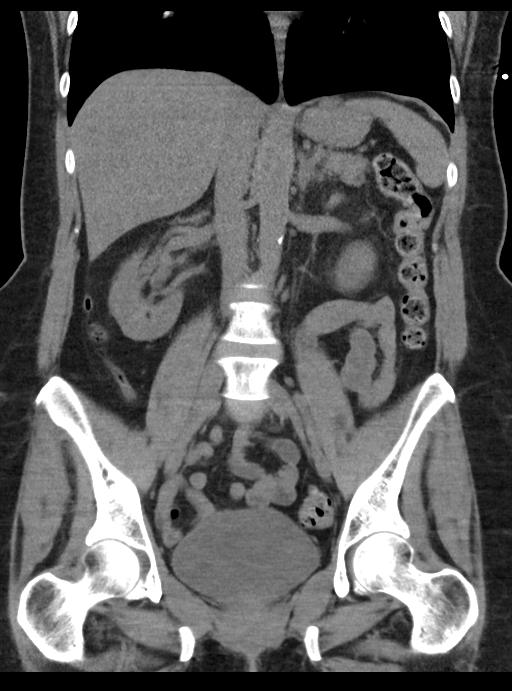

[16 of 46 positions shown; findings below may reference images not displayed]

FINDINGS: Lower chest: No acute abnormality.

Hepatobiliary: No focal liver abnormality is seen. No gallstones,
gallbladder wall thickening, or biliary dilatation.

Pancreas: Unremarkable. No pancreatic ductal dilatation or
surrounding inflammatory changes.

Spleen: Normal in size without focal abnormality.

Adrenals/Urinary Tract: Adrenal glands are unremarkable. Kidneys are
normal, without renal calculi, focal lesion, or hydronephrosis.
Bladder is unremarkable.

Stomach/Bowel: There is suggestion on the unenhanced scan some
potential wall thickening involving the proximal stomach. At the
level of the posterior fundus and cardia, the stomach may measure as
much as 2.5 cm in thickness. Without IV or oral contrast, assessment
of the stomach is limited. Implication would be at least gastritis.
Presence of a gastric mass cannot be excluded. No evidence of bowel
perforation, bowel obstruction or abscess. The appendix is normal.

Vascular/Lymphatic: No significant vascular findings are present. No
enlarged abdominal or pelvic lymph nodes.

Reproductive: Uterus and bilateral adnexa are unremarkable.

Other: No abdominal wall hernia or abnormality. No abdominopelvic
ascites.

Musculoskeletal: No acute or significant osseous findings.
IMPRESSION: The only potential abnormality seen by unenhanced CT is some
prominent thickness of the proximal stomach, especially involving
the posterior fundus/cardia. Underlying gastritis may be present.
Presence of a gastric mass cannot be excluded.

## 2019-07-13 ENCOUNTER — Other Ambulatory Visit: Payer: Self-pay | Admitting: Physician Assistant

## 2019-07-13 DIAGNOSIS — Z1231 Encounter for screening mammogram for malignant neoplasm of breast: Secondary | ICD-10-CM

## 2019-12-04 ENCOUNTER — Other Ambulatory Visit: Payer: Self-pay | Admitting: Physician Assistant

## 2019-12-04 ENCOUNTER — Ambulatory Visit
Admission: RE | Admit: 2019-12-04 | Discharge: 2019-12-04 | Disposition: A | Discharge status: Home / Self Care | Payer: Medicare Other | Source: Ambulatory Visit | Attending: Physician Assistant | Admitting: Physician Assistant

## 2019-12-04 ENCOUNTER — Ambulatory Visit
Admission: RE | Admit: 2019-12-04 | Discharge: 2019-12-04 | Disposition: A | Discharge status: Home / Self Care | Payer: Medicare Other | Attending: Physician Assistant | Admitting: Physician Assistant

## 2019-12-04 DIAGNOSIS — M79644 Pain in right finger(s): Secondary | ICD-10-CM

## 2019-12-04 DIAGNOSIS — M79645 Pain in left finger(s): Secondary | ICD-10-CM | POA: Diagnosis present

## 2019-12-18 ENCOUNTER — Other Ambulatory Visit: Payer: Self-pay | Admitting: Physician Assistant

## 2019-12-18 DIAGNOSIS — R928 Other abnormal and inconclusive findings on diagnostic imaging of breast: Secondary | ICD-10-CM

## 2019-12-26 ENCOUNTER — Other Ambulatory Visit: Payer: Medicare Other

## 2020-01-11 ENCOUNTER — Ambulatory Visit
Admission: RE | Admit: 2020-01-11 | Discharge: 2020-01-11 | Disposition: A | Discharge status: Home / Self Care | Payer: Medicare Other | Source: Ambulatory Visit | Attending: Physician Assistant | Admitting: Physician Assistant

## 2020-01-11 ENCOUNTER — Other Ambulatory Visit: Payer: Self-pay

## 2020-01-11 DIAGNOSIS — R928 Other abnormal and inconclusive findings on diagnostic imaging of breast: Secondary | ICD-10-CM | POA: Diagnosis not present

## 2020-02-05 ENCOUNTER — Ambulatory Visit: Payer: Medicare Other | Admitting: Student in an Organized Health Care Education/Training Program

## 2020-08-04 ENCOUNTER — Other Ambulatory Visit: Payer: Self-pay | Admitting: Physician Assistant

## 2020-08-04 DIAGNOSIS — Z13820 Encounter for screening for osteoporosis: Secondary | ICD-10-CM

## 2021-07-03 ENCOUNTER — Other Ambulatory Visit: Payer: Self-pay

## 2021-07-03 ENCOUNTER — Emergency Department
Admission: EM | Admit: 2021-07-03 | Discharge: 2021-07-03 | Disposition: A | Discharge status: Home / Self Care | Payer: Medicare Other | Attending: Emergency Medicine | Admitting: Emergency Medicine

## 2021-07-03 ENCOUNTER — Emergency Department: Payer: Medicare Other

## 2021-07-03 ENCOUNTER — Encounter: Payer: Self-pay | Admitting: Emergency Medicine

## 2021-07-03 DIAGNOSIS — W010XXA Fall on same level from slipping, tripping and stumbling without subsequent striking against object, initial encounter: Secondary | ICD-10-CM | POA: Insufficient documentation

## 2021-07-03 DIAGNOSIS — M25552 Pain in left hip: Secondary | ICD-10-CM | POA: Insufficient documentation

## 2021-07-03 DIAGNOSIS — R200 Anesthesia of skin: Secondary | ICD-10-CM | POA: Insufficient documentation

## 2021-07-03 DIAGNOSIS — E119 Type 2 diabetes mellitus without complications: Secondary | ICD-10-CM | POA: Diagnosis not present

## 2021-07-03 DIAGNOSIS — S0990XA Unspecified injury of head, initial encounter: Secondary | ICD-10-CM | POA: Diagnosis present

## 2021-07-03 DIAGNOSIS — W19XXXA Unspecified fall, initial encounter: Secondary | ICD-10-CM

## 2021-07-03 DIAGNOSIS — Z23 Encounter for immunization: Secondary | ICD-10-CM | POA: Insufficient documentation

## 2021-07-03 DIAGNOSIS — S0101XA Laceration without foreign body of scalp, initial encounter: Secondary | ICD-10-CM | POA: Diagnosis not present

## 2021-07-03 DIAGNOSIS — Y9301 Activity, walking, marching and hiking: Secondary | ICD-10-CM | POA: Insufficient documentation

## 2021-07-03 LAB — BASIC METABOLIC PANEL
Anion gap: 8 (ref 5–15)
BUN: 17 mg/dL (ref 8–23)
CO2: 26 mmol/L (ref 22–32)
Calcium: 9.6 mg/dL (ref 8.9–10.3)
Chloride: 102 mmol/L (ref 98–111)
Creatinine, Ser: 0.64 mg/dL (ref 0.44–1.00)
GFR, Estimated: >60 mL/min (ref >60)
Glucose, Bld: 326 mg/dL — ABNORMAL HIGH (ref 70–99)
Potassium: 4.4 mmol/L (ref 3.5–5.1)
Sodium: 136 mmol/L (ref 135–145)

## 2021-07-03 LAB — CBC
HCT: 43.6 % (ref 36.0–46.0)
Hemoglobin: 14.2 g/dL (ref 12.0–15.0)
MCH: 29.7 pg (ref 26.0–34.0)
MCHC: 32.6 g/dL (ref 30.0–36.0)
MCV: 91.2 fL (ref 80.0–100.0)
Platelets: 274 10*3/uL (ref 150–400)
RBC: 4.78 MIL/uL (ref 3.87–5.11)
RDW: 12 % (ref 11.5–15.5)
WBC: 5.3 10*3/uL (ref 4.0–10.5)
nRBC: 0 % (ref 0.0–0.2)

## 2021-07-03 MED ORDER — IBUPROFEN 400 MG PO TABS
400.0000 mg | ORAL_TABLET | Freq: Four times a day (QID) | ORAL | 0 refills | Status: AC | PRN
Start: 2021-07-03 — End: 2021-07-13

## 2021-07-03 MED ORDER — TETANUS-DIPHTH-ACELL PERTUSSIS 5-2.5-18.5 LF-MCG/0.5 IM SUSY
0.5000 mL | PREFILLED_SYRINGE | Freq: Once | INTRAMUSCULAR | Status: AC
  Administered 2021-07-03: 0.5 mL via INTRAMUSCULAR
  Filled 2021-07-03: qty 0.5

## 2021-07-03 MED ORDER — IBUPROFEN 400 MG PO TABS
400.0000 mg | ORAL_TABLET | Freq: Four times a day (QID) | ORAL | 0 refills | Status: DC | PRN
Start: 2021-07-03 — End: 2021-07-03

## 2021-07-03 MED ORDER — LIDOCAINE-EPINEPHRINE 1 %-1:100000 IJ SOLN
10.0000 mL | Freq: Once | INTRAMUSCULAR | Status: AC
  Administered 2021-07-03: 10 mL
  Filled 2021-07-03: qty 1

## 2021-07-03 NOTE — ED Notes (Signed)
See triage note  presents via EMS s/p fall  states  she caught her shoes and tripped   having pain to right hand.left hip area  she has some superficial lacerations and cratches ?

## 2021-07-03 NOTE — ED Triage Notes (Signed)
Pt here via [facility] with a fall today. Pt was walking her dog and tripped and hit her head on the left side. Pt has 2 lacerations, bleeding controlled. Pt has prior deformity to right hand but now is having pain in that hand.  ?

## 2021-07-03 NOTE — Discharge Instructions (Signed)
You had 2 small but deep lacerations on the left side of your scalp.  There are 3 stitches in total.  You should have these removed in about 10 to 14 days. ?

## 2021-07-03 NOTE — ED Provider Notes (Signed)
? ?[facility] ?Provider Note ? ? ? Event Date/Time  ? First MD Initiated Contact with Patient 07/03/21 1032   ?  (approximate) ? ? ?History  ? ?Fall ? ? ?HPI ? ?Meredith Martinez is a 71 y.o. female with past medical history of diabetes and diabetic neuropathy chronic right hand deformity who presents after a fall.  Patient was walking her dog when she got caught in the leash and fell.  She hit the left side of her head on the way down.  Did not lose consciousness.  She complains of left neck pain and left headache denies nausea vomiting numbness tingling weakness other than chronic numbness in her feet from her diabetic neuropathy.  Patient also complaining of some left hip pain but has been able to ambulate no chest or abdominal pain.  She is not anticoagulated.  Unknown last tetanus. ?  ? ?Past Medical History:  ?Diagnosis Date  ? Diabetes mellitus without complication (HCC)   ? niddm  ? ? ?Patient Active Problem List  ? Diagnosis Date Noted  ? Gastroparesis 06/25/2017  ? Chronic Lower extremity pain  (Secondary Area of Pain) (bilateral) (L>R) 09/30/2016  ? Chronic pain syndrome 09/30/2016  ? Chronic neck pain(Primary Area of Pain) (Bilateral) (L>R) 09/30/2016  ? Long term current use of opiate analgesic 09/30/2016  ? Long term prescription opiate use 09/30/2016  ? Opiate use 09/30/2016  ? Chronic midline low back pain without sciatica [facility] Area of Pain) (L>R) 09/30/2016  ? Neurogenic pain 09/30/2016  ? Sacroiliac joint pain 09/30/2016  ? ? ? ?Physical Exam  ?Triage Vital Signs: ?ED Triage Vitals  ?Enc Vitals Group  ?   BP 07/03/21 1017 (!) 179/65  ?   Pulse Rate 07/03/21 1017 75  ?   Resp 07/03/21 1017 16  ?   Temp 07/03/21 1018 98.6 ?F (37 ?C)  ?   Temp Source 07/03/21 1018 Oral  ?   SpO2 07/03/21 1017 100 %  ?   Weight 07/03/21 1032 121 lb 4.1 oz (55 kg)  ?   Height 07/03/21 1032 4' 11.75" (1.518 m)  ?   Head Circumference --   ?   Peak Flow --   ?   Pain Score 07/03/21 1018 9  ?    Pain Loc --   ?   Pain Edu? --   ?   Excl. in GC? --   ? ? ?Most recent vital signs: ?Vitals:  ? 07/03/21 1017 07/03/21 1018  ?BP: (!) 179/65   ?Pulse: 75   ?Resp: 16   ?Temp:  98.6 ?F (37 ?C)  ?SpO2: 100%   ? ? ? ?General: Awake, no distress.  ?CV:  Good peripheral perfusion.  ?Resp:  Normal effort.  ?Abd:  No distention.  ?Neuro:             Awake, Alert, Oriented x 3  ?Other:  Chronic right hand deformity, no focal tenderness or swelling ?No midline C-spine tenderness there is left paraspinal cervical tenderness, no T or L-spine tenderness ?No chest wall tenderness ?To palpation of the left greater trochanter without bruising or deformity, to range the hips bilaterally ? ?Dried blood on the ear and neck, 2 small lacerations in the left parietal scalp ?1cm laceration seeming to involve the galea along the left parietal scalp with adjacent small less than half centimeter laceration to the right of it ? ? ?ED Results / Procedures / Treatments  ?Labs ?(all labs ordered are listed, but only  abnormal results are displayed) ?Labs Reviewed  ?BASIC METABOLIC PANEL - Abnormal; Notable for the following components:  ?    Result Value  ? Glucose, Bld 326 (*)   ? All other components within normal limits  ?CBC  ? ? ? ?EKG ? ? ? ? ?RADIOLOGY ?I reviewed the CT scan of the brain which does not show any acute intracranial process; agree with radiology report  ? ? ? ?PROCEDURES: ? ?Critical Care performed: No ? ?Marland Kitchen..Laceration Repair ? ?Date/Time: 07/03/2021 1:05 PM ?Performed by: Georga HackingMcHugh, Milaya Hora Rose, MD ?Authorized by: Georga HackingMcHugh, Tejah Brekke Rose, MD  ? ?Consent:  ?  Consent obtained:  Verbal ?  Risks discussed:  Pain and infection ?Universal protocol:  ?  Patient identity confirmed:  Verbally with patient ?Anesthesia:  ?  Anesthesia method:  Local infiltration ?  Local anesthetic:  Lidocaine 1% WITH epi ?Laceration details:  ?  Location:  Scalp ?  Scalp location:  L parietal ?Pre-procedure details:  ?  Preparation:  Patient was prepped and  draped in usual sterile fashion ?Exploration:  ?  Limited defect created (wound extended): no   ?Treatment:  ?  Area cleansed with:  Saline ?  Amount of cleaning:  Standard ?  Irrigation solution:  Sterile saline ?  Irrigation volume:  100 ?  Irrigation method:  Syringe ?  Visualized foreign bodies/material removed: no   ?  Debridement:  None ?  Undermining:  None ?Skin repair:  ?  Repair method:  Sutures ?  Suture size:  3-0 ?  Suture material:  Nylon ?  Suture technique:  Simple interrupted ?  Number of sutures:  2 ?Approximation:  ?  Approximation:  Close ?Repair type:  ?  Repair type:  Simple ?Post-procedure details:  ?  Dressing:  Open (no dressing) ?Marland Kitchen..Laceration Repair ? ?Date/Time: 07/03/2021 1:06 PM ?Performed by: Georga HackingMcHugh, Cherina Dhillon Rose, MD ?Authorized by: Georga HackingMcHugh, Virgel Haro Rose, MD  ? ?Consent:  ?  Consent obtained:  Verbal ?  Risks discussed:  Infection and pain ?Universal protocol:  ?  Patient identity confirmed:  Verbally with patient ?Anesthesia:  ?  Anesthesia method:  Local infiltration ?  Local anesthetic:  Lidocaine 1% WITH epi ?Laceration details:  ?  Location:  Scalp ?  Scalp location:  L parietal ?  Length (cm):  0.5 ?Pre-procedure details:  ?  Preparation:  Patient was prepped and draped in usual sterile fashion ?Exploration:  ?  Limited defect created (wound extended): no   ?  Imaging outcome: foreign body not noted   ?Treatment:  ?  Area cleansed with:  Saline ?  Amount of cleaning:  Standard ?  Irrigation solution:  Sterile water ?  Irrigation volume:  50 ?  Irrigation method:  Syringe ?  Visualized foreign bodies/material removed: no   ?  Debridement:  None ?Skin repair:  ?  Repair method:  Sutures ?  Suture size:  3-0 ?  Suture material:  Chromic gut ?  Suture technique:  Simple interrupted ?  Number of sutures:  1 ?Approximation:  ?  Approximation:  Close ?Repair type:  ?  Repair type:  Simple ? ? ?MEDICATIONS ORDERED IN ED: ?Medications  ?lidocaine-EPINEPHrine (XYLOCAINE W/EPI) 1 %-1:100000 (with  pres) injection 10 mL (has no administration in time range)  ?Tdap (BOOSTRIX) injection 0.5 mL (0.5 mLs Intramuscular Given 07/03/21 1125)  ? ? ? ?IMPRESSION / MDM / ASSESSMENT AND PLAN / ED COURSE  ?I reviewed the triage vital signs and the nursing notes. ?             ?               ? ?  Differential diagnosis includes, but is not limited to, laceration, skull fracture, intracranial hemorrhage, concussion, contusion ? ?Is a 71 year old female presents after mechanical fall while walking her dog hit the left side of her head on the side of the building while falling did not lose consciousness is not anticoagulated.  She complains of some right hand pain along the fourth and fifth digits as well as left neck pain and left hip pain.  On exam she has 2 small lacerations in the left parietal scalp bleeding is controlled.  She does have no midline C-spine tenderness but paraspinal cervical tenderness.  She is mildly tender over the left greater trochanter with normal range of motion no deformity no ecchymosis. ? ?After her scalp lacerations were cleaned and irrigated the larger laceration does appear to violate the galea but is quite small and difficult to close on its own because of size so I used a large 3-0 nylon suture to obtain a deep bite.  Did the same with the smaller laceration as well for 3 sutures in total.  Advised the patient to have these removed in about 10 to 14 days. ? ?  ? ? ?FINAL CLINICAL IMPRESSION(S) / ED DIAGNOSES  ? ?Final diagnoses:  ?Fall, initial encounter  ?Laceration of scalp, initial encounter  ? ? ? ?Rx / DC Orders  ? ?ED Discharge Orders   ? ? None  ? ?  ? ? ? ?Note:  This document was prepared using Dragon voice recognition software and may include unintentional dictation errors. ?  ?Georga Hacking, MD ?07/03/21 1307 ? ?

## 2021-10-12 ENCOUNTER — Ambulatory Visit: Payer: Medicare Other | Admitting: Pain Medicine

## 2021-10-18 DIAGNOSIS — M899 Disorder of bone, unspecified: Secondary | ICD-10-CM | POA: Insufficient documentation

## 2021-10-18 DIAGNOSIS — Z789 Other specified health status: Secondary | ICD-10-CM | POA: Insufficient documentation

## 2021-10-18 DIAGNOSIS — Z79899 Other long term (current) drug therapy: Secondary | ICD-10-CM | POA: Insufficient documentation

## 2021-10-18 NOTE — Progress Notes (Unsigned)
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 Breast cancer (age of onset: 50) in her mother; Cancer in her mother; Diabetes in her father and mother; Heart disease in her father and mother; Stroke in her father.  Past Surgical History:  Procedure Laterality Date   BREAST BIOPSY Right    neg   ESOPHAGOGASTRODUODENOSCOPY (EGD) WITH PROPOFOL N/A 06/27/2017   Procedure: ESOPHAGOGASTRODUODENOSCOPY (EGD) WITH PROPOFOL;  Surgeon: Anna, Kiran, MD;  Location: [facility] ENDOSCOPY;  Service: Gastroenterology;  Laterality: N/A;   Active Ambulatory Problems    Diagnosis Date Noted   Chronic lower extremity pain (1ry area of Pain) (Bilateral) (R>L) 09/30/2016   Chronic pain syndrome 09/30/2016   Chronic neck pain (1ry area of Pain) (Bilateral) (L>R) 09/30/2016   Long term current use of opiate analgesic 09/30/2016   Long term  prescription opiate use 09/30/2016   Opiate use 09/30/2016   Chronic low back pain (2ry area of Pain) (Bilateral) (L>R) w/o sciatica 09/30/2016   Neurogenic pain 09/30/2016   Sacroiliac joint pain 09/30/2016   Gastroparesis 06/25/2017   Arthralgia 10/06/2017   Chronic radicular cervical pain 03/16/2018   Type 2 diabetes mellitus with hyperglycemia, with long-term current use of insulin (HCC) 10/27/2017   Diabetic   polyneuropathy associated with type 2 diabetes mellitus ([place]) 04/02/2018   Hypertension associated with type 2 diabetes mellitus ([place]) 10/27/2017   Hyperlipidemia associated with type 2 diabetes mellitus ([place]) 10/27/2017   Mixed hyperlipidemia 10/06/2017   Neuropathic pain of hand, right 05/05/2018   Chronic painful diabetic neuropathy ([place]) 03/16/2018   Pharmacologic therapy 10/18/2021   Disorder of skeletal system 10/18/2021   Problems influencing health status 10/18/2021   DDD (degenerative disc disease), cervical 10/19/2021   Osteoporosis 10/19/2021   Osteoarthritis of hip (Right) 10/19/2021   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 In no apparent acute distress Vitals:   10/19/21 1042  BP: (!) 147/66  Pulse: 76  Resp: 16  Temp: (!) 97 F (36.1 C)  TempSrc: Temporal  SpO2: 100%  Weight: 121 lb (54.9 kg)  Height: 4' 11" (1.499 m)   BMI Assessment: Estimated body mass index is 24.44  kg/m as calculated from the following:   Height as of this encounter: 4' 11" (1.499 m).   Weight as of this encounter: 121 lb (54.9 kg).  BMI interpretation table: BMI level Category Range association with higher incidence of chronic pain  <18 kg/m2 Underweight   18.5-24.9 kg/m2 Ideal body weight   25-29.9 kg/m2 Overweight Increased incidence by 20%  30-34.9 kg/m2 Obese (Class I) Increased incidence by 68%  35-39.9 kg/m2 Severe obesity (Class II) Increased incidence by 136%  >40 kg/m2 Extreme obesity (Class III) Increased incidence by 254%   Patient's current BMI Ideal Body weight  Body mass index is 24.44 kg/m. Patient must be at least 60 in tall to calculate ideal body weight   BMI Readings from Last 4 Encounters:  10/19/21 24.44 kg/m  07/03/21 23.88 kg/m  11/04/18 23.63 kg/m  11/28/17 26.26 kg/m   Wt Readings from Last 4 Encounters:  10/19/21 121 lb (54.9 kg)  07/03/21 121 lb 4.1 oz (55 kg)  11/04/18 120 lb (54.4 kg)  11/28/17 130 lb (59 kg)    Psych/Mental status: Alert, oriented x 3 (person, place, & time)       Eyes: PERLA Respiratory: No evidence of acute respiratory distress  Assessment  Primary Diagnosis & Pertinent Problem List: The primary encounter diagnosis was Chronic pain syndrome. 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  Visit Diagnosis (New problems to examiner): 1. Chronic pain syndrome   2. Chronic lower extremity pain (1ry area of Pain) (Bilateral) (R>L)   3. Diabetic peripheral neuropathy (HCC) (1ry area of Pain)   4. Diabetic polyneuropathy associated with type 2 diabetes mellitus (HCC)   5. Chronic low back pain (2ry area of Pain) (Bilateral) (L>R) w/o sciatica   6. Chronic neck pain (3ry area of Pain) (Bilateral) (R>L)   7. Cervicalgia   8. DDD (degenerative disc disease), cervical   9. Chronic upper extremity pain (4th area of Pain) (Right)   10. Chronic hand pain (5th area of Pain) (Bilateral)   11. 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 Atrophy of muscle of hand (Right)   13. Wrist fracture, sequela (Right)   14. Osteoarthritis of hip (Right)   15. Pharmacologic therapy   16. Disorder of skeletal system   17. Problems influencing health status   18. Osteoporosis without current pathological fracture, unspecified osteoporosis type    Plan of Care (Initial workup plan)  Note: Meredith Martinez was reminded that as per protocol, today's visit has been an evaluation only. We have not taken over the patient's controlled substance management.  Problem-specific plan: No problem-specific Assessment & Plan notes found for this encounter.  Lab Orders         Compliance Drug Analysis, Ur         Comp. Metabolic Panel (12)         Magnesium         Vitamin B12         Sedimentation rate         25-Hydroxy vitamin D Lcms D2+D3         C-reactive protein         Hemoglobin A1c     Imaging Orders         DG Cervical Spine With Flex & Extend         DG Lumbar Spine Complete W/Bend         MR CERVICAL SPINE WO CONTRAST     Referral Orders  No referral(s) requested today   Procedure Orders    No procedure(s) ordered  today   Pharmacotherapy (current): Medications ordered:  No orders of the defined types were placed in this encounter.  Medications administered during this visit: Meredith Martinez had no medications administered during this visit.   Pharmacological management options:  Opioid Analgesics: The patient was informed that there is no guarantee that she would be a candidate for opioid analgesics. The decision will be made following CDC guidelines. This decision will be based on the results of diagnostic studies, as well as Meredith Martinez's risk profile.   Membrane stabilizer: To be determined at a later time  Muscle relaxant: To be determined at a later time  NSAID: To be determined at a later time  Other analgesic(s): To be determined at a later time   Interventional management options: Meredith Martinez was informed that there is no guarantee that she would be a candidate for interventional therapies. The decision will be based on the results of diagnostic studies, as well as Meredith Martinez's risk profile.  Procedure(s) under consideration:  Pending results of ordered studies      Interventional Therapies  Risk  Complexity Considerations:   Estimated body mass index is 24.44 kg/m as calculated from the following:   Height as of this encounter: 4' 11" (1.499 m).   Weight as of   this encounter: 121 lb (54.9 kg). WNL   Planned  Pending:   Pending further evaluation   Under consideration:   Therapeutic bilateral lower extremity [place] treatments #1    Completed:   None at this time   Completed by other providers:   None at this time   Therapeutic  Palliative (PRN) options:   None established    Provider-requested follow-up: Return for (40min), Eval-day (M,W), (F2F), 2nd Visit, for review of ordered tests.  No future appointments.   Note by: Francisco A Naveira, MD Date: 10/19/2021; Time: 12:25 PM 

## 2021-10-19 ENCOUNTER — Other Ambulatory Visit: Payer: Self-pay

## 2021-10-19 ENCOUNTER — Encounter: Payer: Self-pay | Admitting: Pain Medicine

## 2021-10-19 ENCOUNTER — Ambulatory Visit (HOSPITAL_BASED_OUTPATIENT_CLINIC_OR_DEPARTMENT_OTHER): Payer: Medicare Other | Admitting: Pain Medicine

## 2021-10-19 ENCOUNTER — Ambulatory Visit
Admission: RE | Admit: 2021-10-19 | Discharge: 2021-10-19 | Disposition: A | Discharge status: Home / Self Care | Payer: Medicare Other | Source: Ambulatory Visit | Attending: Pain Medicine | Admitting: Pain Medicine

## 2021-10-19 ENCOUNTER — Ambulatory Visit
Admission: RE | Admit: 2021-10-19 | Discharge: 2021-10-19 | Disposition: A | Discharge status: Home / Self Care | Payer: Medicare Other | Attending: Pain Medicine | Admitting: Pain Medicine

## 2021-10-19 VITALS — BP 147/66 | HR 76 | Temp 97.0°F | Resp 16 | Ht 59.0 in | Wt 121.0 lb

## 2021-10-19 DIAGNOSIS — M79601 Pain in right arm: Secondary | ICD-10-CM | POA: Insufficient documentation

## 2021-10-19 DIAGNOSIS — M5030 Other cervical disc degeneration, unspecified cervical region: Secondary | ICD-10-CM | POA: Insufficient documentation

## 2021-10-19 DIAGNOSIS — G8929 Other chronic pain: Secondary | ICD-10-CM

## 2021-10-19 DIAGNOSIS — M79604 Pain in right leg: Secondary | ICD-10-CM | POA: Insufficient documentation

## 2021-10-19 DIAGNOSIS — M899 Disorder of bone, unspecified: Secondary | ICD-10-CM | POA: Insufficient documentation

## 2021-10-19 DIAGNOSIS — M542 Cervicalgia: Secondary | ICD-10-CM | POA: Insufficient documentation

## 2021-10-19 DIAGNOSIS — M79641 Pain in right hand: Secondary | ICD-10-CM

## 2021-10-19 DIAGNOSIS — M62541 Muscle wasting and atrophy, not elsewhere classified, right hand: Secondary | ICD-10-CM

## 2021-10-19 DIAGNOSIS — M1611 Unilateral primary osteoarthritis, right hip: Secondary | ICD-10-CM | POA: Insufficient documentation

## 2021-10-19 DIAGNOSIS — M810 Age-related osteoporosis without current pathological fracture: Secondary | ICD-10-CM

## 2021-10-19 DIAGNOSIS — G894 Chronic pain syndrome: Secondary | ICD-10-CM | POA: Insufficient documentation

## 2021-10-19 DIAGNOSIS — M24541 Contracture, right hand: Secondary | ICD-10-CM

## 2021-10-19 DIAGNOSIS — Z789 Other specified health status: Secondary | ICD-10-CM | POA: Insufficient documentation

## 2021-10-19 DIAGNOSIS — Z79899 Other long term (current) drug therapy: Secondary | ICD-10-CM | POA: Insufficient documentation

## 2021-10-19 DIAGNOSIS — M79642 Pain in left hand: Secondary | ICD-10-CM

## 2021-10-19 DIAGNOSIS — M79605 Pain in left leg: Secondary | ICD-10-CM

## 2021-10-19 DIAGNOSIS — E1142 Type 2 diabetes mellitus with diabetic polyneuropathy: Secondary | ICD-10-CM | POA: Insufficient documentation

## 2021-10-19 DIAGNOSIS — S62101S Fracture of unspecified carpal bone, right wrist, sequela: Secondary | ICD-10-CM | POA: Insufficient documentation

## 2021-10-19 DIAGNOSIS — M545 Low back pain, unspecified: Secondary | ICD-10-CM

## 2021-10-19 NOTE — Progress Notes (Signed)
Safety precautions to be maintained throughout the outpatient stay will include: orient to surroundings, keep bed in low position, maintain call bell within reach at all times, provide assistance with transfer out of bed and ambulation.  

## 2021-10-22 LAB — COMPLIANCE DRUG ANALYSIS, UR

## 2021-10-25 LAB — COMP. METABOLIC PANEL (12)
AST: 19 IU/L (ref 0–40)
Albumin/Globulin Ratio: 1.5 (ref 1.2–2.2)
Albumin: 4.8 g/dL (ref 3.9–4.9)
Alkaline Phosphatase: 119 IU/L (ref 44–121)
BUN/Creatinine Ratio: 13 (ref 12–28)
BUN: 9 mg/dL (ref 8–27)
Bilirubin Total: 0.4 mg/dL (ref 0.0–1.2)
Calcium: 10.1 mg/dL (ref 8.7–10.3)
Chloride: 99 mmol/L (ref 96–106)
Creatinine, Ser: 0.7 mg/dL (ref 0.57–1.00)
Globulin, Total: 3.3 g/dL (ref 1.5–4.5)
Glucose: 278 mg/dL — ABNORMAL HIGH (ref 70–99)
Potassium: 4.6 mmol/L (ref 3.5–5.2)
Sodium: 138 mmol/L (ref 134–144)
Total Protein: 8.1 g/dL (ref 6.0–8.5)
eGFR: 94 mL/min/{1.73_m2} (ref >59)

## 2021-10-25 LAB — HEMOGLOBIN A1C
Est. average glucose Bld gHb Est-mCnc: 312 mg/dL
Hgb A1c MFr Bld: 12.5 % — ABNORMAL HIGH (ref 4.8–5.6)

## 2021-10-25 LAB — MAGNESIUM: Magnesium: 2.1 mg/dL (ref 1.6–2.3)

## 2021-10-25 LAB — 25-HYDROXY VITAMIN D LCMS D2+D3
25-Hydroxy, Vitamin D-2: <1 ng/mL
25-Hydroxy, Vitamin D-3: 8.1 ng/mL
25-Hydroxy, Vitamin D: 8.6 ng/mL — ABNORMAL LOW

## 2021-10-25 LAB — C-REACTIVE PROTEIN: CRP: 4 mg/L (ref 0–10)

## 2021-10-25 LAB — VITAMIN B12: Vitamin B-12: 461 pg/mL (ref 232–1245)

## 2021-10-25 LAB — SEDIMENTATION RATE: Sed Rate: 54 mm/hr — ABNORMAL HIGH (ref 0–40)

## 2021-10-27 ENCOUNTER — Ambulatory Visit
Admission: RE | Admit: 2021-10-27 | Discharge: 2021-10-27 | Disposition: A | Discharge status: Home / Self Care | Payer: Medicare Other | Source: Ambulatory Visit | Attending: Pain Medicine | Admitting: Pain Medicine

## 2021-10-27 DIAGNOSIS — M5030 Other cervical disc degeneration, unspecified cervical region: Secondary | ICD-10-CM

## 2021-10-27 DIAGNOSIS — G8929 Other chronic pain: Secondary | ICD-10-CM

## 2021-10-27 DIAGNOSIS — M542 Cervicalgia: Secondary | ICD-10-CM | POA: Diagnosis not present

## 2021-11-18 ENCOUNTER — Telehealth: Payer: Self-pay | Admitting: Pain Medicine

## 2021-11-18 NOTE — Telephone Encounter (Signed)
PT stated that both of her appt that was scheduled at [facility] has been canceled due to the tech being out for surgery. PT stated that she will be able to rescheduled the appts in Nov or Dec. PT stated that she is in pain and will like to know if doctor will write her an prescription for the pain that she is having. PT also ask is it another doctor that she may be referral to, because that's an long time to wait to be seen. Please give patient a call. Thanks

## 2021-11-18 NOTE — Telephone Encounter (Signed)
Unable to get into [facility] to get nerve conduction study until later this year. I informed her that our office specializes in interventional therapy, it is unlikely that no meds for pain will be written for patient. I asked Dr. Dossie Arbour, ok to see patient for second visit before nerve conduction study done. Patient notified.

## 2021-12-05 NOTE — Progress Notes (Unsigned)
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 Take 25 mg by mouth daily., Disp: , Rfl:    Lidocaine HCl-Benzyl Alcohol (SALONPAS LIDOCAINE PLUS) 4-10 % CREA, Apply 1 Application topically at bedtime., Disp: , Rfl:    Lidocaine-Menthol (ICY HOT MAX LIDOCAINE) 4-1 % CREA, Apply 1 Application topically at bedtime., Disp: , Rfl:    lisinopril (PRINIVIL,ZESTRIL) 10 MG tablet, Take 10 mg by mouth daily., Disp: , Rfl:    metFORMIN (GLUCOPHAGE) 1000 MG tablet, Take 1,000 mg by mouth daily., Disp: , Rfl:    montelukast (SINGULAIR) 10 MG tablet, Take 10 mg by mouth at bedtime., Disp: , Rfl:    promethazine (PHENERGAN) 25 MG tablet, Take 25 mg by mouth., Disp: , Rfl:   ROS  Constitutional: Denies any fever or chills Gastrointestinal: No reported hemesis,  hematochezia, vomiting, or acute GI distress  Musculoskeletal: Denies any acute onset joint swelling, redness, loss of ROM, or weakness Neurological: No reported episodes of acute onset apraxia, aphasia, dysarthria, agnosia, amnesia, paralysis, loss of coordination, or loss of consciousness  Allergies  Ms. Kegg is allergic to amoxicillin and iodine.  [place]  Drug: Ms. Shimmel  reports no history of drug use. Alcohol:  reports no history of alcohol use. Tobacco:  reports that she quit smoking about 15 years ago. Her smoking use included cigarettes. She has never used smokeless tobacco. Medical:  has a past medical history of Diabetes mellitus without complication ([place]). Surgical: Ms. Ballman  has a past surgical history that includes Breast biopsy (Right) and Esophagogastroduodenoscopy (egd) with propofol (N/A, 06/27/2017). Family: family history includes Arthritis in her mother; Breast cancer (age of onset: 77) in her mother; Cancer in her mother; Diabetes in her father and mother; Heart disease in her father and mother; Stroke in her father.  Constitutional Exam  General appearance: Well nourished, well developed, and well hydrated. In no apparent acute distress There were no vitals filed for this visit. BMI Assessment: Estimated body mass index is 24.44 kg/m as calculated from the following:   Height as of 10/19/21: _0  (1.499 m).   Weight as of 10/19/21: 121 lb (54.9 kg).  BMI interpretation table: BMI level Category Range association with higher incidence of chronic pain  <18 kg/m2 Underweight   18.5-24.9 kg/m2 Ideal body weight   25-29.9 kg/m2 Overweight Increased incidence by 20%  30-34.9 kg/m2 Obese (Class I) Increased incidence by 68%  35-39.9 kg/m2 Severe obesity (Class II) Increased incidence by 136%  >40 kg/m2 Extreme obesity (Class III) Increased incidence by 254%   Patient's current BMI Ideal Body weight  There is no height or weight on file to calculate BMI.  Patient weight not recorded   BMI Readings from Last 4 Encounters:  10/19/21 24.44 kg/m  07/03/21 23.88 kg/m  11/04/18 23.63 kg/m  11/28/17 26.26 kg/m   Wt Readings from Last 4 Encounters:  10/19/21 121 lb (54.9 kg)  07/03/21 121 lb 4.1 oz (55 kg)  11/04/18 120 lb (54.4 kg)  11/28/17 130 lb (59 kg)    Psych/Mental status: Alert, oriented x 3 (person, place, & time)       Eyes: PERLA Respiratory: No evidence of acute respiratory distress  Assessment & Plan  Primary Diagnosis & Pertinent Problem List: The primary encounter diagnosis was Chronic pain syndrome. Diagnoses of Chronic lower extremity pain (1ry area of Pain) (Bilateral) (R>L), Chronic low back pain (2ry area of Pain) (Bilateral) (L>R) w/o sciatica, Chronic neck pain (3ry area of Pain) (Bilateral) (L>R), Chronic painful diabetic neuropathy (HCC), Chronic upper extremity pain (4th area of Pain) (Right), Chronic hand pain (5th area of Pain) (Bilateral), Diabetic peripheral neuropathy (HCC) (1ry area of Pain), Diabetic polyneuropathy associated with type 2 diabetes mellitus ([place]), and Abnormal MRI, cervical spine (10/28/2021) were also pertinent to this visit.  Visit Diagnosis: 1. Chronic pain syndrome   2. Chronic lower extremity pain (1ry area of Pain) (Bilateral) (R>L)   3. Chronic low back pain (2ry area of Pain) (Bilateral) (L>R) w/o sciatica   4. Chronic neck pain (3ry area of Pain) (Bilateral) (L>R)   5. Chronic painful diabetic neuropathy ([place])   6. Chronic upper extremity pain (4th area of Pain) (Right)   7. Chronic hand pain (5th area of Pain) (Bilateral)   8. Diabetic peripheral neuropathy (HCC) (1ry area of Pain)   9. Diabetic polyneuropathy associated with type 2 diabetes mellitus (  [place])   10. Abnormal MRI, cervical spine (10/28/2021)    Problems updated and reviewed during this visit: Problem  Abnormal MRI, cervical spine (10/28/2021)   (10/28/2021) CERVICAL MRI FINDINGS: DISC LEVELS: C2-3: Mild right greater  than left facet arthrosis without stenosis. C3-4: Mild uncovertebral spurring without stenosis. C4-5: Mild disc space narrowing. Disc bulging and uncovertebral spurring result in mild spinal stenosis and mild bilateral neural foraminal stenosis. C5-6: Mild disc space narrowing. Disc bulging and uncovertebral spurring result in mild spinal stenosis without neural foraminal stenosis. C6-7: Disc bulging and uncovertebral spurring result in mild right neural foraminal stenosis without spinal stenosis. C7-T1: Negative.  IMPRESSION: 1. Mild cervical disc and facet degeneration with mild spinal stenosis at C4-5 and C5-6. 2. Mild neural foraminal stenosis at C4-5 and C6-7.   Chronic neck pain (3ry area of Pain) (Bilateral) (L>R)    Plan of Care  Pharmacotherapy (Medications Ordered): No orders of the defined types were placed in this encounter.  Procedure Orders    No procedure(s) ordered today   Lab Orders  No laboratory test(s) ordered today   Imaging Orders  No imaging studies ordered today   Referral Orders  No referral(s) requested today    Pharmacological management:  Opioid Analgesics: I will not be prescribing any opioids at this time Membrane stabilizer: I will not be prescribing any at this time Muscle relaxant: I will not be prescribing any at this time NSAID: I will not be prescribing any at this time Other analgesic(s): I will not be prescribing any at this time     Interventional Therapies  Risk  Complexity Considerations:   Estimated body mass index is 24.44 kg/m as calculated from the following:   Height as of this encounter: _0  (1.499 m).   Weight as of this encounter: 121 lb (54.9 kg). NIDDM   Planned  Pending:      Under consideration:   Therapeutic bilateral lower extremity Qutenza treatments #1  Diagnostic/therapeutic midline cervical ESI #1  Diagnostic bilateral cervical facet MBB #1  Possible bilateral cervical facet RFA    Completed:    None at this time   Completed by other providers:   None at this time   Therapeutic  Palliative (PRN) options:   None established     Provider-requested follow-up: No follow-ups on file. Recent Visits Date Type Provider Dept  10/19/21 Office Visit Milinda Pointer, MD [facility]  Showing recent visits within past 90 days and meeting all other requirements Future Appointments Date Type Provider Dept  12/09/21 Appointment Milinda Pointer, MD [facility]  Showing future appointments within next 90 days and meeting all other requirements  Primary Care Physician: [facility] [place] Note by: Gaspar Cola, MD Date: 12/09/2021; Time: 7:16 AM

## 2021-12-08 DIAGNOSIS — R937 Abnormal findings on diagnostic imaging of other parts of musculoskeletal system: Secondary | ICD-10-CM | POA: Insufficient documentation

## 2021-12-08 NOTE — Patient Instructions (Incomplete)
Capsaicin Patches What is this medication? CAPSAICIN (cap SAY sin) relieves minor pain in your muscles and joints. It works by making your skin feel warm or cool, which blocks pain signals going to the brain. This medicine may be used for other purposes; ask your health care provider or pharmacist if you have questions. COMMON BRAND NAME(S): Qutenza What should I tell my care team before I take this medication? They need to know if you have any of these conditions: Broken or irritated skin High blood pressure History of heart attack or stroke An unusual or allergic reaction to capsaicin, hot peppers, other medications, foods, dyes, or preservatives Pregnant or trying to get pregnant Breast-feeding How should I use this medication? This medication is for external use only. It is applied by your care team in a hospital or clinic setting. Talk to your care team about the use of this medication in children. Special care may be needed. Overdosage: If you think you have taken too much of this medicine contact a poison control center or emergency room at once. NOTE: This medicine is only for you. Do not share this medicine with others. What if I miss a dose? This does not apply. What may interact with this medication? Interactions are not expected. Do not use any other skin products on the affected area without asking your care team. This list may not describe all possible interactions. Give your health care provider a list of all the medicines, herbs, non-prescription drugs, or dietary supplements you use. Also tell them if you smoke, drink alcohol, or use illegal drugs. Some items may interact with your medicine. What should I watch for while using this medication? Your condition will be monitored carefully while you are receiving this medication. Your blood pressure may go up during the procedure. Do not touch the medication patch during treatment. This medication causes red, burning skin. You  may need pain medication for during and after the procedure. This medication can make you more sensitive to heat for a few days after treatment. Be careful in hot showers or baths. Keep out of the sun. Exercise may make the treated skin feel hotter. Tell your care team if your symptoms do not start to get better or if they get worse. What side effects may I notice from receiving this medication? Side effects that you should report to your care team as soon as possible: Allergic reactions--skin rash, itching, hives, swelling of the face, lips, tongue, or throat Side effects that usually do not require medical attention (report these to your care team if they continue or are bothersome): Mild skin irritation, redness, or dryness This list may not describe all possible side effects. Call your doctor for medical advice about side effects. You may report side effects to FDA at 1-800-FDA-1088. Where should I keep my medication? This medication is given in a hospital or clinic. It will not be stored at home. NOTE: This sheet is a summary. It may not cover all possible information. If you have questions about this medicine, talk to your doctor, pharmacist, or health care provider.  2023 Elsevier/Gold Standard (2020-10-08 00:00:00) ______________________________________________________________________  Preparing for Procedure with Sedation  NOTICE: Due to recent regulatory changes, starting on September 22, 2020, procedures requiring intravenous (IV) sedation will no longer be performed at the [place].  These types of procedures are required to be performed at [facility] ambulatory surgery facility.  We are very sorry for the inconvenience.  Procedure appointments are limited to planned  procedures: No Prescription Refills. No disability issues will be discussed. No medication changes will be discussed.  Instructions: Oral Intake: Do not eat or drink anything for at least 8 hours prior to your  procedure. (Exception: Blood Pressure Medication. See below.) Transportation: A driver is required. You may not drive yourself after the procedure. Blood Pressure Medicine: Do not forget to take your blood pressure medicine with a sip of water the morning of the procedure. If your Diastolic (lower reading) is above 100 mmHg, elective cases will be cancelled/rescheduled. Blood thinners: These will need to be stopped for procedures. Notify our staff if you are taking any blood thinners. Depending on which one you take, there will be specific instructions on how and when to stop it. Diabetics on insulin: Notify the staff so that you can be scheduled 1st case in the morning. If your diabetes requires high dose insulin, take only  of your normal insulin dose the morning of the procedure and notify the staff that you have done so. Preventing infections: Shower with an antibacterial soap the morning of your procedure. Build-up your immune system: Take 1000 mg of Vitamin C with every meal (3 times a day) the day prior to your procedure. Antibiotics: Inform the staff if you have a condition or reason that requires you to take antibiotics before dental procedures. Pregnancy: If you are pregnant, call and cancel the procedure. Sickness: If you have a cold, fever, or any active infections, call and cancel the procedure. Arrival: You must be in the facility at least 30 minutes prior to your scheduled procedure. Children: Do not bring children with you. Dress appropriately: There is always the possibility that your clothing may get soiled. Valuables: Do not bring any jewelry or valuables.  Reasons to call and reschedule or cancel your procedure: (Following these recommendations will minimize the risk of a serious complication.) Surgeries: Avoid having procedures within 2 weeks of any surgery. (Avoid for 2 weeks before or after any surgery). Flu Shots: Avoid having procedures within 2 weeks of a flu shots. (Avoid  for 2 weeks before or after immunizations). Barium: Avoid having a procedure within 7-10 days after having had a radiological study involving the use of radiological contrast. (Myelograms, Barium swallow or enema study). Heart attacks: Avoid any elective procedures or surgeries for the initial 6 months after a "Myocardial Infarction" (Heart Attack). Blood thinners: It is imperative that you stop these medications before procedures. Let us know if you if you take any blood thinner.  Infection: Avoid procedures during or within two weeks of an infection (including chest colds or gastrointestinal problems). Symptoms associated with infections include: Localized redness, fever, chills, night sweats or profuse sweating, burning sensation when voiding, cough, congestion, stuffiness, runny nose, sore throat, diarrhea, nausea, vomiting, cold or Flu symptoms, recent or current infections. It is specially important if the infection is over the area that we intend to treat. Heart and lung problems: Symptoms that may suggest an active cardiopulmonary problem include: cough, chest pain, breathing difficulties or shortness of breath, dizziness, ankle swelling, uncontrolled high or unusually low blood pressure, and/or palpitations. If you are experiencing any of these symptoms, cancel your procedure and contact your primary care physician for an evaluation.  Remember:  Regular Business hours are:  Monday to Thursday 8:00 AM to 4:00 PM  Provider's Schedule: Milinda Pointer, MD:  Procedure days: Tuesday and Thursday 7:30 AM to 4:00 PM  Gillis Santa, MD:  Procedure days: Monday and Wednesday 7:30 AM to 4:00  PM ______________________________________________________________________  ______________________________________________________________________  Preparing for Procedure with Sedation  NOTICE: Due to recent regulatory changes, starting on September 22, 2020, procedures requiring intravenous (IV) sedation will  no longer be performed at the [place].  These types of procedures are required to be performed at [facility] ambulatory surgery facility.  We are very sorry for the inconvenience.  Procedure appointments are limited to planned procedures: No Prescription Refills. No disability issues will be discussed. No medication changes will be discussed.  Instructions: Oral Intake: Do not eat or drink anything for at least 8 hours prior to your procedure. (Exception: Blood Pressure Medication. See below.) Transportation: A driver is required. You may not drive yourself after the procedure. Blood Pressure Medicine: Do not forget to take your blood pressure medicine with a sip of water the morning of the procedure. If your Diastolic (lower reading) is above 100 mmHg, elective cases will be cancelled/rescheduled. Blood thinners: These will need to be stopped for procedures. Notify our staff if you are taking any blood thinners. Depending on which one you take, there will be specific instructions on how and when to stop it. Diabetics on insulin: Notify the staff so that you can be scheduled 1st case in the morning. If your diabetes requires high dose insulin, take only  of your normal insulin dose the morning of the procedure and notify the staff that you have done so. Preventing infections: Shower with an antibacterial soap the morning of your procedure. Build-up your immune system: Take 1000 mg of Vitamin C with every meal (3 times a day) the day prior to your procedure. Antibiotics: Inform the staff if you have a condition or reason that requires you to take antibiotics before dental procedures. Pregnancy: If you are pregnant, call and cancel the procedure. Sickness: If you have a cold, fever, or any active infections, call and cancel the procedure. Arrival: You must be in the facility at least 30 minutes prior to your scheduled procedure. Children: Do not bring children with you. Dress  appropriately: There is always the possibility that your clothing may get soiled. Valuables: Do not bring any jewelry or valuables.  Reasons to call and reschedule or cancel your procedure: (Following these recommendations will minimize the risk of a serious complication.) Surgeries: Avoid having procedures within 2 weeks of any surgery. (Avoid for 2 weeks before or after any surgery). Flu Shots: Avoid having procedures within 2 weeks of a flu shots. (Avoid for 2 weeks before or after immunizations). Barium: Avoid having a procedure within 7-10 days after having had a radiological study involving the use of radiological contrast. (Myelograms, Barium swallow or enema study). Heart attacks: Avoid any elective procedures or surgeries for the initial 6 months after a "Myocardial Infarction" (Heart Attack). Blood thinners: It is imperative that you stop these medications before procedures. Let us know if you if you take any blood thinner.  Infection: Avoid procedures during or within two weeks of an infection (including chest colds or gastrointestinal problems). Symptoms associated with infections include: Localized redness, fever, chills, night sweats or profuse sweating, burning sensation when voiding, cough, congestion, stuffiness, runny nose, sore throat, diarrhea, nausea, vomiting, cold or Flu symptoms, recent or current infections. It is specially important if the infection is over the area that we intend to treat. Heart and lung problems: Symptoms that may suggest an active cardiopulmonary problem include: cough, chest pain, breathing difficulties or shortness of breath, dizziness, ankle swelling, uncontrolled high or unusually low blood pressure, and/or palpitations.  If you are experiencing any of these symptoms, cancel your procedure and contact your primary care physician for an evaluation.  Remember:  Regular Business hours are:  Monday to Thursday 8:00 AM to 4:00 PM  Provider's  Schedule: Delano Metz, MD:  Procedure days: Tuesday and Thursday 7:30 AM to 4:00 PM  Edward Jolly, MD:  Procedure days: Monday and Wednesday 7:30 AM to 4:00 PM ______________________________________________________________________  ______________________________________________________________________  Preparing for Procedure with Sedation  NOTICE: Due to recent regulatory changes, starting on September 22, 2020, procedures requiring intravenous (IV) sedation will no longer be performed at the [facility].  These types of procedures are required to be performed at [facility] ambulatory surgery facility.  We are very sorry for the inconvenience.  Procedure appointments are limited to planned procedures: No Prescription Refills. No disability issues will be discussed. No medication changes will be discussed.  Instructions: Oral Intake: Do not eat or drink anything for at least 8 hours prior to your procedure. (Exception: Blood Pressure Medication. See below.) Transportation: A driver is required. You may not drive yourself after the procedure. Blood Pressure Medicine: Do not forget to take your blood pressure medicine with a sip of water the morning of the procedure. If your Diastolic (lower reading) is above 100 mmHg, elective cases will be cancelled/rescheduled. Blood thinners: These will need to be stopped for procedures. Notify our staff if you are taking any blood thinners. Depending on which one you take, there will be specific instructions on how and when to stop it. Diabetics on insulin: Notify the staff so that you can be scheduled 1st case in the morning. If your diabetes requires high dose insulin, take only  of your normal insulin dose the morning of the procedure and notify the staff that you have done so. Preventing infections: Shower with an antibacterial soap the morning of your procedure. Build-up your immune system: Take 1000 mg of Vitamin C with every meal (3 times a  day) the day prior to your procedure. Antibiotics: Inform the staff if you have a condition or reason that requires you to take antibiotics before dental procedures. Pregnancy: If you are pregnant, call and cancel the procedure. Sickness: If you have a cold, fever, or any active infections, call and cancel the procedure. Arrival: You must be in the facility at least 30 minutes prior to your scheduled procedure. Children: Do not bring children with you. Dress appropriately: There is always the possibility that your clothing may get soiled. Valuables: Do not bring any jewelry or valuables.  Reasons to call and reschedule or cancel your procedure: (Following these recommendations will minimize the risk of a serious complication.) Surgeries: Avoid having procedures within 2 weeks of any surgery. (Avoid for 2 weeks before or after any surgery). Flu Shots: Avoid having procedures within 2 weeks of a flu shots. (Avoid for 2 weeks before or after immunizations). Barium: Avoid having a procedure within 7-10 days after having had a radiological study involving the use of radiological contrast. (Myelograms, Barium swallow or enema study). Heart attacks: Avoid any elective procedures or surgeries for the initial 6 months after a "Myocardial Infarction" (Heart Attack). Blood thinners: It is imperative that you stop these medications before procedures. Let us know if you if you take any blood thinner.  Infection: Avoid procedures during or within two weeks of an infection (including chest colds or gastrointestinal problems). Symptoms associated with infections include: Localized redness, fever, chills, night sweats or profuse sweating, burning sensation when voiding, cough,  congestion, stuffiness, runny nose, sore throat, diarrhea, nausea, vomiting, cold or Flu symptoms, recent or current infections. It is specially important if the infection is over the area that we intend to treat. Heart and lung problems:  Symptoms that may suggest an active cardiopulmonary problem include: cough, chest pain, breathing difficulties or shortness of breath, dizziness, ankle swelling, uncontrolled high or unusually low blood pressure, and/or palpitations. If you are experiencing any of these symptoms, cancel your procedure and contact your primary care physician for an evaluation.  Remember:  Regular Business hours are:  Monday to Thursday 8:00 AM to 4:00 PM  Provider's Schedule: Delano Metz, MD:  Procedure days: Tuesday and Thursday 7:30 AM to 4:00 PM  Edward Jolly, MD:  Procedure days: Monday and Wednesday 7:30 AM to 4:00 PM ______________________________________________________________________

## 2021-12-09 ENCOUNTER — Encounter: Payer: Self-pay | Admitting: Pain Medicine

## 2021-12-09 ENCOUNTER — Ambulatory Visit: Payer: Medicare Other | Attending: Pain Medicine | Admitting: Pain Medicine

## 2021-12-09 VITALS — BP 144/64 | HR 62 | Temp 97.3°F | Resp 16 | Ht <= 58 in | Wt 125.0 lb

## 2021-12-09 DIAGNOSIS — M79642 Pain in left hand: Secondary | ICD-10-CM | POA: Diagnosis present

## 2021-12-09 DIAGNOSIS — M79604 Pain in right leg: Secondary | ICD-10-CM | POA: Diagnosis present

## 2021-12-09 DIAGNOSIS — R7309 Other abnormal glucose: Secondary | ICD-10-CM | POA: Insufficient documentation

## 2021-12-09 DIAGNOSIS — E1142 Type 2 diabetes mellitus with diabetic polyneuropathy: Secondary | ICD-10-CM | POA: Diagnosis present

## 2021-12-09 DIAGNOSIS — R937 Abnormal findings on diagnostic imaging of other parts of musculoskeletal system: Secondary | ICD-10-CM | POA: Diagnosis present

## 2021-12-09 DIAGNOSIS — R700 Elevated erythrocyte sedimentation rate: Secondary | ICD-10-CM | POA: Insufficient documentation

## 2021-12-09 DIAGNOSIS — G8929 Other chronic pain: Secondary | ICD-10-CM | POA: Diagnosis present

## 2021-12-09 DIAGNOSIS — M542 Cervicalgia: Secondary | ICD-10-CM | POA: Diagnosis present

## 2021-12-09 DIAGNOSIS — M79601 Pain in right arm: Secondary | ICD-10-CM | POA: Diagnosis present

## 2021-12-09 DIAGNOSIS — E1140 Type 2 diabetes mellitus with diabetic neuropathy, unspecified: Secondary | ICD-10-CM | POA: Diagnosis present

## 2021-12-09 DIAGNOSIS — E559 Vitamin D deficiency, unspecified: Secondary | ICD-10-CM | POA: Diagnosis present

## 2021-12-09 DIAGNOSIS — G894 Chronic pain syndrome: Secondary | ICD-10-CM | POA: Diagnosis present

## 2021-12-09 DIAGNOSIS — M79605 Pain in left leg: Secondary | ICD-10-CM | POA: Diagnosis present

## 2021-12-09 DIAGNOSIS — M79641 Pain in right hand: Secondary | ICD-10-CM | POA: Insufficient documentation

## 2021-12-09 DIAGNOSIS — M545 Low back pain, unspecified: Secondary | ICD-10-CM | POA: Insufficient documentation

## 2021-12-09 MED ORDER — ERGOCALCIFEROL 1.25 MG (50000 UT) PO CAPS: 50000.0000 [IU] | ORAL_CAPSULE | ORAL | 0 refills | Status: AC

## 2021-12-09 NOTE — Progress Notes (Signed)
Safety precautions to be maintained throughout the outpatient stay will include: orient to surroundings, keep bed in low position, maintain call bell within reach at all times, provide assistance with transfer out of bed and ambulation.  

## 2021-12-22 ENCOUNTER — Ambulatory Visit
Admission: RE | Admit: 2021-12-22 | Discharge: 2021-12-22 | Disposition: A | Discharge status: Home / Self Care | Payer: Medicare Other | Source: Ambulatory Visit | Attending: Pain Medicine | Admitting: Pain Medicine

## 2021-12-22 DIAGNOSIS — G8929 Other chronic pain: Secondary | ICD-10-CM | POA: Diagnosis present

## 2021-12-22 DIAGNOSIS — M79604 Pain in right leg: Secondary | ICD-10-CM | POA: Diagnosis present

## 2021-12-22 DIAGNOSIS — M545 Low back pain, unspecified: Secondary | ICD-10-CM | POA: Diagnosis present

## 2021-12-22 DIAGNOSIS — M79605 Pain in left leg: Secondary | ICD-10-CM | POA: Diagnosis present

## 2022-01-05 ENCOUNTER — Telehealth: Payer: Self-pay | Admitting: Pain Medicine

## 2022-01-05 NOTE — Telephone Encounter (Signed)
Megan from [facility] called about EMG/PNCV studies. The pateint was able to complete the upper extremities and they will send report as soon as it is completed. She could not do the lower extremities at all. They couldn't even get the pads on her legs.  They wanted to make sure Dr. Laban Emperor knew this.

## 2022-01-16 NOTE — Progress Notes (Unsigned)
PROVIDER NOTE: Information contained herein reflects review and annotations entered in association with encounter. Interpretation of such information and data should be left to medically-trained personnel. Information provided to patient can be located elsewhere in the medical record under "Patient Instructions". Document created using STT-dictation technology, any transcriptional errors that may result from process are unintentional.    Patient: Meredith Martinez  Service Category: E/M  Provider: Gaspar Cola, MD  DOB: [birth year]  DOS: 01/20/2022  Referring Provider: [facility], Jenny Reichmann*  MRN: [id number]  Specialty: Interventional Pain Management  PCP: [facility], [place]  Type: Established Patient  Setting: Ambulatory outpatient    Location: Office  Delivery: Face-to-face     HPI  Ms. Meredith Martinez, a 71 y.o. year old female, is here today because of her No primary diagnosis found.. Meredith Martinez's primary complain today is No chief complaint on file. Last encounter: My last encounter with her was on 01/05/2022. 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 Pain Assessment: Severity of   is reported as a  /10. Location:    / . Onset:  . Quality:  . Timing:  . Modifying factor(s):  Marland Kitchen Vitals:  vitals were not taken for this visit.   Reason for encounter:  *** . ***  Pharmacotherapy Assessment  Analgesic: None MME/day: 0 mg/day   Monitoring: [place] PMP: PDMP reviewed during this encounter.       Pharmacotherapy: No side-effects or adverse reactions reported. Compliance: No problems identified. Effectiveness: Clinically acceptable.  No notes on file  No results found for: "CBDTHCR" No results found for: "D8THCCBX" No results found for: "D9THCCBX"  UDS:  Summary  Date Value Ref Range Status  10/19/2021 Note  Final    Comment:    ==================================================================== Compliance Drug Analysis, Ur ==================================================================== Test                             Result       Flag       Units  Drug Present and Declared for Prescription Verification   Gabapentin                     PRESENT      EXPECTED  Drug Absent but Declared for Prescription Verification   Promethazine                   Not Detected UNEXPECTED ==================================================================== Test                      Result    Flag   Units      Ref Range   Creatinine              65  mg/dL      >=20 ==================================================================== Declared Medications:  The flagging and interpretation on this report are based on the  following declared medications.  Unexpected results may arise from  inaccuracies in the declared medications.   **Note: The testing scope of this panel includes these medications:   Gabapentin (Neurontin)  Promethazine (Phenergan)   **Note: The testing scope of this panel does not include the  following reported medications:   Alendronate (Fosamax)  Capsaicin  (Salonpas)  Empagliflozin (Jardiance)  Famotidine (Pepcid)  Glipizide (Glucotrol)  Lisinopril (Zestril)  Menthol (Icy Hot)  Metformin (Glucophage)  Montelukast (Singulair)  Vitamin D3 ==================================================================== For clinical consultation, please call [phone]. ====================================================================       ROS  Constitutional: Denies any fever or chills Gastrointestinal: No reported hemesis, hematochezia, vomiting, or acute GI distress Musculoskeletal: Denies any acute onset joint swelling, redness, loss of ROM, or weakness Neurological: No reported episodes of acute onset apraxia, aphasia, dysarthria, agnosia, amnesia, paralysis, loss of coordination, or loss of consciousness  Medication Review  Lidocaine HCl-Benzyl Alcohol, Lidocaine-Menthol, Vitamin D-3, alendronate, empagliflozin, ergocalciferol, famotidine, gabapentin, glipiZIDE, lisinopril, metFORMIN, montelukast, and promethazine  History Review  Allergy: Meredith Martinez is allergic to amoxicillin and iodine. Drug: Meredith Martinez  reports no history of drug use. Alcohol:  reports no history of alcohol use. Tobacco:  reports that she quit smoking about 15 years ago. Her smoking use included cigarettes. She has never used smokeless tobacco. Social: Meredith Martinez  reports that she quit smoking about 15 years ago. Her smoking use included cigarettes. She has never used smokeless tobacco. She reports that she does not drink alcohol and does not use drugs. Medical:  has a past medical history of Diabetes mellitus without complication ([place]). Surgical: Meredith Martinez  has a past surgical history that includes Breast biopsy (Right) and Esophagogastroduodenoscopy (egd) with propofol (N/A, 06/27/2017). Family: family history includes Arthritis in her mother; Breast cancer (age of onset: 65) in her mother; Cancer in her mother; Diabetes in her father and mother; Heart  disease in her father and mother; Stroke in her father.  Laboratory Chemistry Profile   Renal Lab Results  Component Value Date   BUN 9 10/19/2021   CREATININE 0.70 10/19/2021   BCR 13 10/19/2021   GFRAA >60 11/04/2018   GFRNONAA >60 07/03/2021    Hepatic Lab Results  Component Value Date   AST 19 10/19/2021   ALT 26 11/04/2018   ALBUMIN 4.8 10/19/2021   ALKPHOS 119 10/19/2021   LIPASE 25 11/04/2018    Electrolytes Lab Results  Component Value Date   NA 138 10/19/2021   K 4.6 10/19/2021   CL 99 10/19/2021   CALCIUM 10.1 10/19/2021   MG 2.1 10/19/2021    Bone Lab Results  Component Value Date   25OHVITD1 8.6 (L) 10/19/2021   25OHVITD2 <1.0 10/19/2021   25OHVITD3 8.1 10/19/2021    Inflammation (CRP: Acute Phase) (ESR: Chronic Phase) Lab Results  Component Value Date   CRP 4 10/19/2021   ESRSEDRATE 54 (H) 10/19/2021         Note: Above Lab results reviewed.  Recent Imaging Review  MR LUMBAR SPINE WO CONTRAST CLINICAL DATA:  Chronic low back pain radiating down bilateral legs with numbness and tingling to the feet. Worsened since a fall in May.  EXAM: MRI LUMBAR SPINE WITHOUT CONTRAST  TECHNIQUE: Multiplanar, multisequence MR imaging of the lumbar spine was performed. No intravenous contrast was administered.  COMPARISON:  Lumbar spine radiographs 10/19/2021, CT abdomen/pelvis 06/25/2017  FINDINGS: Segmentation: Standard; the lowest formed disc space is designated L5-S1.  Alignment:  Normal.  Vertebrae: Vertebral body heights are preserved. Background marrow signal is normal. There is no suspicious marrow signal abnormality or marrow edema.  Conus medullaris and cauda equina: Conus extends to the L1 level. Conus and cauda equina appear normal.  Paraspinal and other soft tissues: Unremarkable.  Disc levels:  There is mild disc desiccation without significant loss of height at L4-L5. There is a minimal disc bulge and minimal facet  arthropathy with trace fluid at this level without significant spinal canal or neural foraminal stenosis. The other disc heights are preserved, without evidence of herniation. There is minimal facet arthropathy on the left at L5-S1.  IMPRESSION: Essentially normal lumbar spine MRI with minimal degenerative changes at L4-L5 and L5-S1 but no significant spinal canal or neural foraminal stenosis. No cause for the patient's back pain or radicular symptoms is identified.  Electronically Signed   By: Valetta Mole M.D.   On: 12/24/2021 13:01 Note: Reviewed        Physical Exam  General appearance: Well nourished, well developed, and well hydrated. In no apparent acute distress Mental status: Alert, oriented x 3 (person, place, & time)       Respiratory: No evidence of acute respiratory distress Eyes: PERLA Vitals: There were no vitals taken for this visit. BMI: Estimated body mass index is 26.13 kg/m as calculated from the following:   Height as of 12/09/21: _0  (1.473 m).   Weight as of 12/09/21: 125 lb (56.7 kg). Ideal: Patient weight not recorded  Assessment   Diagnosis Status  No diagnosis found. Controlled Controlled Controlled   Updated Problems: No problems updated.  Plan of Care  Problem-specific:  No problem-specific Assessment & Plan notes found for this encounter.  Meredith Martinez has a current medication list which includes the following long-term medication(s): famotidine, gabapentin, glipizide, lisinopril, montelukast, and promethazine.  Pharmacotherapy (Medications Ordered): No orders of the defined types were placed in this encounter.  Orders:  No orders of the defined types were placed in this encounter.  Follow-up plan:   No follow-ups on file.     Interventional Therapies  Risk  Complexity Considerations:   Estimated body mass index is 24.44 kg/m as calculated from the following:   Height as of this encounter: _1  (1.499 m).   Weight  as of this encounter: 121 lb (54.9 kg). NIDDM   Planned  Pending:   MRI of the lumbar spine Nerve conduction test of both lower extremities.   Under consideration:   Therapeutic bilateral lower extremity [place] treatments #1  Diagnostic/therapeutic midline cervical ESI #1  Diagnostic bilateral cervical facet MBB #1  Possible bilateral cervical facet RFA    Completed:   None at this time   Completed by other providers:   None at this time   Therapeutic  Palliative (PRN) options:   None established      Recent Visits Date Type Provider Dept  12/09/21 Office Visit Milinda Pointer, MD [facility]  Showing recent visits within past 90 days and meeting all other requirements Future Appointments Date Type Provider Dept  01/20/22 Appointment Milinda Pointer, MD [facility]  Showing future appointments within next 90 days and meeting all other requirements  I discussed the assessment and treatment plan with the patient. The patient was provided an opportunity to ask questions and all were answered. The patient agreed with the plan and demonstrated an understanding of the  instructions.  Patient advised to call back or seek an in-person evaluation if the symptoms or condition worsens.  Duration of encounter: *** minutes.  Total time on encounter, as per AMA guidelines included both the face-to-face and non-face-to-face time personally spent by the physician and/or other qualified health care professional(s) on the day of the encounter (includes time in activities that require the physician or other qualified health care professional and does not include time in activities normally performed by clinical staff). Physician's time may include the following activities when performed: preparing to see the patient (eg, review of tests, pre-charting review of records) obtaining and/or reviewing separately obtained history performing a medically appropriate  examination and/or evaluation counseling and educating the patient/family/caregiver ordering medications, tests, or procedures referring and communicating with other health care professionals (when not separately reported) documenting clinical information in the electronic or other health record independently interpreting results (not separately reported) and communicating results to the patient/ family/caregiver care coordination (not separately reported)  Note by: Gaspar Cola, MD Date: 01/20/2022; Time: 3:26 PM

## 2022-01-20 ENCOUNTER — Ambulatory Visit: Payer: Medicare Other | Attending: Pain Medicine | Admitting: Pain Medicine

## 2022-01-20 ENCOUNTER — Encounter: Payer: Self-pay | Admitting: Pain Medicine

## 2022-01-20 VITALS — BP 126/83 | HR 73 | Temp 97.3°F | Resp 16 | Ht 59.0 in | Wt 125.0 lb

## 2022-01-20 DIAGNOSIS — Z794 Long term (current) use of insulin: Secondary | ICD-10-CM | POA: Diagnosis present

## 2022-01-20 DIAGNOSIS — M79604 Pain in right leg: Secondary | ICD-10-CM | POA: Insufficient documentation

## 2022-01-20 DIAGNOSIS — M79641 Pain in right hand: Secondary | ICD-10-CM | POA: Insufficient documentation

## 2022-01-20 DIAGNOSIS — M79605 Pain in left leg: Secondary | ICD-10-CM | POA: Diagnosis present

## 2022-01-20 DIAGNOSIS — M47816 Spondylosis without myelopathy or radiculopathy, lumbar region: Secondary | ICD-10-CM | POA: Diagnosis present

## 2022-01-20 DIAGNOSIS — R7309 Other abnormal glucose: Secondary | ICD-10-CM | POA: Diagnosis present

## 2022-01-20 DIAGNOSIS — G8929 Other chronic pain: Secondary | ICD-10-CM | POA: Insufficient documentation

## 2022-01-20 DIAGNOSIS — E1165 Type 2 diabetes mellitus with hyperglycemia: Secondary | ICD-10-CM | POA: Insufficient documentation

## 2022-01-20 DIAGNOSIS — E1142 Type 2 diabetes mellitus with diabetic polyneuropathy: Secondary | ICD-10-CM | POA: Diagnosis present

## 2022-01-20 DIAGNOSIS — M79642 Pain in left hand: Secondary | ICD-10-CM | POA: Diagnosis present

## 2022-01-20 DIAGNOSIS — M542 Cervicalgia: Secondary | ICD-10-CM | POA: Diagnosis present

## 2022-01-20 DIAGNOSIS — G894 Chronic pain syndrome: Secondary | ICD-10-CM | POA: Diagnosis present

## 2022-01-20 DIAGNOSIS — M545 Low back pain, unspecified: Secondary | ICD-10-CM | POA: Insufficient documentation

## 2022-01-20 DIAGNOSIS — R937 Abnormal findings on diagnostic imaging of other parts of musculoskeletal system: Secondary | ICD-10-CM | POA: Insufficient documentation

## 2022-01-20 DIAGNOSIS — M79601 Pain in right arm: Secondary | ICD-10-CM | POA: Diagnosis present

## 2022-01-20 NOTE — Patient Instructions (Signed)
____________________________________________________________________________________________  Patient Information update  To: All of our patients.  Re: Name change.  It has been made official that our current name, "[facility]"   will soon be changed to "[facility] [place] [facility]".   The purpose of this change is to eliminate any confusion created by the concept of our practice being a "Medication Management Pain Clinic". In the past this has led to the misconception that we treat pain primarily by the use of prescription medications.  Nothing can be farther from the truth.   Understanding PAIN MANAGEMENT: To further understand what our practice does, you first have to understand that "Pain Management" is a subspecialty that requires additional training once a physician has completed their specialty training, which can be in either Anesthesia, Neurology, Psychiatry, or Physical Medicine and Rehabilitation (PMR). Each one of these contributes to the final approach taken by each physician to the management of their patient's pain. To be a "Pain Management Specialist" you must have first completed one of the specialty trainings below.  Anesthesiologists - trained in clinical pharmacology and interventional techniques such as nerve blockade and regional as well as central neuroanatomy. They are trained to block pain before, during, and after surgical interventions.  Neurologists - trained in the diagnosis and pharmacological treatment of complex neurological conditions, such as Multiple Sclerosis, Parkinson's, spinal cord injuries, and other systemic conditions that may be associated with symptoms that may include but are not limited to pain. They tend to rely primarily on the treatment of chronic pain using prescription medications.  Psychiatrist - trained in conditions affecting the psychosocial  wellbeing of patients including but not limited to depression, anxiety, schizophrenia, personality disorders, addiction, and other substance use disorders that may be associated with chronic pain. They tend to rely primarily on the treatment of chronic pain using prescription medications.   Physical Medicine and Rehabilitation (PMR) physicians, also known as physiatrists - trained to treat a wide variety of medical conditions affecting the brain, spinal cord, nerves, bones, joints, ligaments, muscles, and tendons. Their training is primarily aimed at treating patients that have suffered injuries that have caused severe physical impairment. Their training is primarily aimed at the physical therapy and rehabilitation of those patients. They may also work alongside orthopedic surgeons or neurosurgeons using their expertise in assisting surgical patients to recover after their surgeries.  INTERVENTIONAL PAIN MANAGEMENT is sub-subspecialty of Pain Management.  Our physicians are Board-certified in Anesthesia, Pain Management, and Interventional Pain Management.  This meaning that not only have they been trained and Board-certified in their specialty of Anesthesia, and subspecialty of Pain Management, but they have also received further training in the sub-subspecialty of Interventional Pain Management, in order to become Board-certified as INTERVENTIONAL PAIN MANAGEMENT SPECIALIST.    Mission: Our goal is to use our skills in  INTERVENTIONAL PAIN MANAGEMENT as alternatives to the chronic use of prescription opioid medications for the treatment of pain. To make this more clear, we have changed our name to reflect what we do and offer. We will continue to offer medication management assessment and recommendations, but we will not be taking over any patient's medication management.  ____________________________________________________________________________________________      ____________________________________________________________________________________________  New Patients  Welcome to [facility] [place] [facility].   Initial Visit The first or initial visit consists of an evaluation only.   Interventional pain management.  We offer therapies other than opioid controlled substances   to manage chronic pain. These include, but are not limited to, diagnostic, therapeutic, and palliative specialized injection therapies (i.e.: Epidural Steroids, Facet Blocks, etc.). We specialize in a variety of nerve blocks as well as radiofrequency treatments. We offer pain implant evaluations and trials, as well as follow up management. In addition we also provide a variety joint injections, including Viscosupplementation (AKA: Gel Therapy).  Prescription Pain Medication We provide evaluations for/of pharmacologic therapies. Recommendations will follow CDC Guidelines.  We no longer take patients for long-term medication management. We will not be taking over your pain medications.  ____________________________________________________________________________________________    

## 2022-01-20 NOTE — Progress Notes (Signed)
Safety precautions to be maintained throughout the outpatient stay will include: orient to surroundings, keep bed in low position, maintain call bell within reach at all times, provide assistance with transfer out of bed and ambulation.  

## 2022-03-01 ENCOUNTER — Other Ambulatory Visit: Payer: Self-pay | Admitting: Pain Medicine

## 2022-03-01 DIAGNOSIS — E559 Vitamin D deficiency, unspecified: Secondary | ICD-10-CM

## 2022-04-29 ENCOUNTER — Other Ambulatory Visit: Payer: Self-pay | Admitting: Family Medicine

## 2022-04-29 DIAGNOSIS — Z1231 Encounter for screening mammogram for malignant neoplasm of breast: Secondary | ICD-10-CM

## 2023-01-04 ENCOUNTER — Other Ambulatory Visit: Payer: Self-pay | Admitting: Family Medicine

## 2023-01-04 DIAGNOSIS — M79604 Pain in right leg: Secondary | ICD-10-CM

## 2023-01-18 ENCOUNTER — Ambulatory Visit: Admission: RE | Admit: 2023-01-18 | Payer: 59 | Source: Ambulatory Visit

## 2023-02-01 ENCOUNTER — Ambulatory Visit
Admission: RE | Admit: 2023-02-01 | Discharge: 2023-02-01 | Disposition: A | Discharge status: Home / Self Care | Payer: 59 | Source: Ambulatory Visit | Attending: Family Medicine | Admitting: Family Medicine

## 2023-02-01 DIAGNOSIS — M79604 Pain in right leg: Secondary | ICD-10-CM | POA: Insufficient documentation

## 2023-02-01 DIAGNOSIS — M79605 Pain in left leg: Secondary | ICD-10-CM | POA: Insufficient documentation

## 2023-03-17 ENCOUNTER — Encounter (INDEPENDENT_AMBULATORY_CARE_PROVIDER_SITE_OTHER): Payer: Self-pay

## 2023-06-29 ENCOUNTER — Inpatient Hospital Stay
Admission: EM | Admit: 2023-06-29 | Discharge: 2023-07-06 | DRG: 064 | Disposition: A | Discharge status: Home Health | Attending: Internal Medicine | Admitting: Internal Medicine

## 2023-06-29 ENCOUNTER — Observation Stay

## 2023-06-29 ENCOUNTER — Emergency Department

## 2023-06-29 DIAGNOSIS — Z823 Family history of stroke: Secondary | ICD-10-CM

## 2023-06-29 DIAGNOSIS — R29701 NIHSS score 1: Secondary | ICD-10-CM | POA: Diagnosis present

## 2023-06-29 DIAGNOSIS — I639 Cerebral infarction, unspecified: Secondary | ICD-10-CM | POA: Diagnosis not present

## 2023-06-29 DIAGNOSIS — E1159 Type 2 diabetes mellitus with other circulatory complications: Secondary | ICD-10-CM | POA: Diagnosis present

## 2023-06-29 DIAGNOSIS — K219 Gastro-esophageal reflux disease without esophagitis: Secondary | ICD-10-CM | POA: Diagnosis present

## 2023-06-29 DIAGNOSIS — I63541 Cerebral infarction due to unspecified occlusion or stenosis of right cerebellar artery: Secondary | ICD-10-CM | POA: Diagnosis not present

## 2023-06-29 DIAGNOSIS — Z7984 Long term (current) use of oral hypoglycemic drugs: Secondary | ICD-10-CM

## 2023-06-29 DIAGNOSIS — E1142 Type 2 diabetes mellitus with diabetic polyneuropathy: Secondary | ICD-10-CM | POA: Diagnosis present

## 2023-06-29 DIAGNOSIS — E1169 Type 2 diabetes mellitus with other specified complication: Secondary | ICD-10-CM | POA: Diagnosis present

## 2023-06-29 DIAGNOSIS — Z87891 Personal history of nicotine dependence: Secondary | ICD-10-CM

## 2023-06-29 DIAGNOSIS — Z794 Long term (current) use of insulin: Secondary | ICD-10-CM

## 2023-06-29 DIAGNOSIS — R112 Nausea with vomiting, unspecified: Secondary | ICD-10-CM | POA: Diagnosis present

## 2023-06-29 DIAGNOSIS — I152 Hypertension secondary to endocrine disorders: Secondary | ICD-10-CM | POA: Diagnosis present

## 2023-06-29 DIAGNOSIS — Z7902 Long term (current) use of antithrombotics/antiplatelets: Secondary | ICD-10-CM

## 2023-06-29 DIAGNOSIS — Z8249 Family history of ischemic heart disease and other diseases of the circulatory system: Secondary | ICD-10-CM

## 2023-06-29 DIAGNOSIS — R4182 Altered mental status, unspecified: Secondary | ICD-10-CM | POA: Diagnosis present

## 2023-06-29 DIAGNOSIS — K3184 Gastroparesis: Secondary | ICD-10-CM | POA: Diagnosis present

## 2023-06-29 DIAGNOSIS — Z79899 Other long term (current) drug therapy: Secondary | ICD-10-CM

## 2023-06-29 DIAGNOSIS — Z888 Allergy status to other drugs, medicaments and biological substances status: Secondary | ICD-10-CM

## 2023-06-29 DIAGNOSIS — G9341 Metabolic encephalopathy: Secondary | ICD-10-CM | POA: Diagnosis present

## 2023-06-29 DIAGNOSIS — Z881 Allergy status to other antibiotic agents status: Secondary | ICD-10-CM

## 2023-06-29 DIAGNOSIS — E1143 Type 2 diabetes mellitus with diabetic autonomic (poly)neuropathy: Secondary | ICD-10-CM | POA: Diagnosis present

## 2023-06-29 DIAGNOSIS — Z833 Family history of diabetes mellitus: Secondary | ICD-10-CM

## 2023-06-29 DIAGNOSIS — E785 Hyperlipidemia, unspecified: Secondary | ICD-10-CM | POA: Diagnosis present

## 2023-06-29 DIAGNOSIS — G894 Chronic pain syndrome: Secondary | ICD-10-CM | POA: Diagnosis present

## 2023-06-29 DIAGNOSIS — E1140 Type 2 diabetes mellitus with diabetic neuropathy, unspecified: Secondary | ICD-10-CM | POA: Diagnosis present

## 2023-06-29 DIAGNOSIS — Z7982 Long term (current) use of aspirin: Secondary | ICD-10-CM

## 2023-06-29 DIAGNOSIS — Z7983 Long term (current) use of bisphosphonates: Secondary | ICD-10-CM

## 2023-06-29 DIAGNOSIS — E1165 Type 2 diabetes mellitus with hyperglycemia: Secondary | ICD-10-CM | POA: Diagnosis present

## 2023-06-29 LAB — CBG MONITORING, ED
Glucose-Capillary: 403 mg/dL — ABNORMAL HIGH (ref 70–99)
Glucose-Capillary: 361 mg/dL — ABNORMAL HIGH (ref 70–99)

## 2023-06-29 LAB — PROTIME-INR
INR: 1 (ref 0.8–1.2)
Prothrombin Time: 13 s (ref 11.4–15.2)

## 2023-06-29 LAB — COMPREHENSIVE METABOLIC PANEL WITH GFR
ALT: 23 U/L (ref 0–44)
AST: 22 U/L (ref 15–41)
Albumin: 4 g/dL (ref 3.5–5.0)
Alkaline Phosphatase: 109 U/L (ref 38–126)
Anion gap: 10 (ref 5–15)
BUN: 12 mg/dL (ref 8–23)
CO2: 25 mmol/L (ref 22–32)
Calcium: 9.7 mg/dL (ref 8.9–10.3)
Chloride: 96 mmol/L — ABNORMAL LOW (ref 98–111)
Creatinine, Ser: 0.69 mg/dL (ref 0.44–1.00)
GFR, Estimated: >60 mL/min (ref >60)
Glucose, Bld: 395 mg/dL — ABNORMAL HIGH (ref 70–99)
Potassium: 3.6 mmol/L (ref 3.5–5.1)
Sodium: 131 mmol/L — ABNORMAL LOW (ref 135–145)
Total Bilirubin: 0.8 mg/dL (ref 0.0–1.2)
Total Protein: 8.1 g/dL (ref 6.5–8.1)

## 2023-06-29 LAB — DIFFERENTIAL
Abs Immature Granulocytes: 0.01 10*3/uL (ref 0.00–0.07)
Basophils Absolute: 0.1 10*3/uL (ref 0.0–0.1)
Basophils Relative: 1 %
Eosinophils Absolute: 0.2 10*3/uL (ref 0.0–0.5)
Eosinophils Relative: 3 %
Immature Granulocytes: 0 %
Lymphocytes Relative: 58 %
Lymphs Abs: 4.3 10*3/uL — ABNORMAL HIGH (ref 0.7–4.0)
Monocytes Absolute: 0.6 10*3/uL (ref 0.1–1.0)
Monocytes Relative: 8 %
Neutro Abs: 2.3 10*3/uL (ref 1.7–7.7)
Neutrophils Relative %: 30 %

## 2023-06-29 LAB — LIPASE, BLOOD: Lipase: 26 U/L (ref 11–51)

## 2023-06-29 LAB — CBC
HCT: 40.2 % (ref 36.0–46.0)
Hemoglobin: 13.9 g/dL (ref 12.0–15.0)
MCH: 30 pg (ref 26.0–34.0)
MCHC: 34.6 g/dL (ref 30.0–36.0)
MCV: 86.6 fL (ref 80.0–100.0)
Platelets: 308 10*3/uL (ref 150–400)
RBC: 4.64 MIL/uL (ref 3.87–5.11)
RDW: 11.6 % (ref 11.5–15.5)
WBC: 7.4 10*3/uL (ref 4.0–10.5)
nRBC: 0 % (ref 0.0–0.2)

## 2023-06-29 LAB — HEMOGLOBIN A1C
Hgb A1c MFr Bld: 14 % — ABNORMAL HIGH (ref 4.8–5.6)
Mean Plasma Glucose: 355.1 mg/dL

## 2023-06-29 LAB — ETHANOL: Alcohol, Ethyl (B): <15 mg/dL (ref <15)

## 2023-06-29 LAB — APTT: aPTT: 26 s (ref 24–36)

## 2023-06-29 MED ORDER — INSULIN ASPART 100 UNIT/ML IJ SOLN
0.0000 [IU] | Freq: Three times a day (TID) | INTRAMUSCULAR | Status: DC
  Administered 2023-06-30 (×2): 5 [IU] via SUBCUTANEOUS
  Filled 2023-06-29 (×2): qty 1

## 2023-06-29 MED ORDER — LORAZEPAM 2 MG/ML IJ SOLN
2.0000 mg | Freq: Once | INTRAMUSCULAR | Status: AC
Start: 2023-06-29 — End: 2023-06-29
  Administered 2023-06-29: 2 mg via INTRAVENOUS

## 2023-06-29 MED ORDER — HYDRALAZINE HCL 20 MG/ML IJ SOLN: 5.0000 mg | INTRAMUSCULAR | Status: DC | PRN

## 2023-06-29 MED ORDER — DIPHENHYDRAMINE HCL 50 MG/ML IJ SOLN
50.0000 mg | Freq: Once | INTRAMUSCULAR | Status: AC
  Administered 2023-06-29: 50 mg via INTRAVENOUS

## 2023-06-29 MED ORDER — METOCLOPRAMIDE HCL 10 MG PO TABS: 10.0000 mg | ORAL_TABLET | Freq: Four times a day (QID) | ORAL | Status: DC

## 2023-06-29 MED ORDER — HALOPERIDOL LACTATE 5 MG/ML IJ SOLN: 2.0000 mg | Freq: Four times a day (QID) | INTRAMUSCULAR | Status: DC | PRN

## 2023-06-29 MED ORDER — INSULIN GLARGINE-YFGN 100 UNIT/ML ~~LOC~~ SOLN
10.0000 [IU] | Freq: Every day | SUBCUTANEOUS | Status: DC
  Administered 2023-06-29 – 2023-07-03 (×5): 10 [IU] via SUBCUTANEOUS
  Filled 2023-06-29 (×5): qty 0.1

## 2023-06-29 MED ORDER — ONDANSETRON HCL 4 MG/2ML IJ SOLN
4.0000 mg | Freq: Once | INTRAMUSCULAR | Status: AC
  Administered 2023-06-29: 4 mg via INTRAVENOUS
  Filled 2023-06-29: qty 2

## 2023-06-29 MED ORDER — ASPIRIN 81 MG PO TBEC
81.0000 mg | DELAYED_RELEASE_TABLET | Freq: Every day | ORAL | Status: DC
  Administered 2023-07-01 – 2023-07-06 (×6): 81 mg via ORAL
  Filled 2023-06-29 (×6): qty 1

## 2023-06-29 MED ORDER — STROKE: EARLY STAGES OF RECOVERY BOOK: Freq: Once | Status: AC

## 2023-06-29 MED ORDER — IOHEXOL 350 MG/ML SOLN
100.0000 mL | Freq: Once | INTRAVENOUS | Status: AC | PRN
  Administered 2023-06-29: 100 mL via INTRAVENOUS

## 2023-06-29 MED ORDER — ENOXAPARIN SODIUM 40 MG/0.4ML IJ SOSY
40.0000 mg | PREFILLED_SYRINGE | INTRAMUSCULAR | Status: DC
  Administered 2023-06-29 – 2023-07-05 (×7): 40 mg via SUBCUTANEOUS
  Filled 2023-06-29 (×7): qty 0.4

## 2023-06-29 MED ORDER — FAMOTIDINE 20 MG PO TABS
40.0000 mg | ORAL_TABLET | Freq: Every evening | ORAL | Status: DC
Start: 2023-06-29 — End: 2023-07-06
  Administered 2023-07-01 – 2023-07-05 (×5): 40 mg via ORAL
  Filled 2023-06-29 (×5): qty 2

## 2023-06-29 MED ORDER — ATORVASTATIN CALCIUM 20 MG PO TABS
80.0000 mg | ORAL_TABLET | Freq: Every day | ORAL | Status: DC
  Administered 2023-07-01 – 2023-07-06 (×6): 80 mg via ORAL
  Filled 2023-06-29 (×6): qty 4

## 2023-06-29 MED ORDER — SENNOSIDES-DOCUSATE SODIUM 8.6-50 MG PO TABS: 1.0000 | ORAL_TABLET | Freq: Every evening | ORAL | Status: DC | PRN

## 2023-06-29 MED ORDER — METOCLOPRAMIDE HCL 5 MG/ML IJ SOLN
10.0000 mg | Freq: Three times a day (TID) | INTRAMUSCULAR | Status: DC
  Administered 2023-06-29 – 2023-06-30 (×2): 10 mg via INTRAVENOUS
  Filled 2023-06-29 (×2): qty 2

## 2023-06-29 MED ORDER — ACETAMINOPHEN 160 MG/5ML PO SOLN: 650.0000 mg | ORAL | Status: DC | PRN

## 2023-06-29 MED ORDER — INSULIN ASPART 100 UNIT/ML IJ SOLN
0.0000 [IU] | Freq: Every day | INTRAMUSCULAR | Status: DC
  Administered 2023-06-29: 5 [IU] via SUBCUTANEOUS
  Administered 2023-07-02: 2 [IU] via SUBCUTANEOUS
  Filled 2023-06-29 (×2): qty 1

## 2023-06-29 MED ORDER — MORPHINE SULFATE (PF) 2 MG/ML IV SOLN: 1.0000 mg | INTRAVENOUS | Status: DC | PRN

## 2023-06-29 MED ORDER — DIPHENHYDRAMINE HCL 50 MG/ML IJ SOLN
25.0000 mg | Freq: Once | INTRAMUSCULAR | Status: DC
  Filled 2023-06-29: qty 1

## 2023-06-29 MED ORDER — CLOPIDOGREL BISULFATE 75 MG PO TABS
75.0000 mg | ORAL_TABLET | Freq: Every day | ORAL | Status: DC
  Administered 2023-07-01 – 2023-07-06 (×6): 75 mg via ORAL
  Filled 2023-06-29 (×6): qty 1

## 2023-06-29 MED ORDER — ACETAMINOPHEN 325 MG PO TABS
650.0000 mg | ORAL_TABLET | ORAL | Status: DC | PRN
  Administered 2023-07-03 – 2023-07-05 (×3): 650 mg via ORAL
  Filled 2023-06-29 (×3): qty 2

## 2023-06-29 MED ORDER — ACETAMINOPHEN 650 MG RE SUPP: 650.0000 mg | RECTAL | Status: DC | PRN

## 2023-06-29 MED ORDER — SODIUM CHLORIDE 0.9% FLUSH
3.0000 mL | Freq: Once | INTRAVENOUS | Status: AC
  Administered 2023-06-29: 3 mL via INTRAVENOUS

## 2023-06-29 MED ORDER — HYDROMORPHONE HCL 1 MG/ML IJ SOLN
0.5000 mg | Freq: Once | INTRAMUSCULAR | Status: AC
  Administered 2023-06-29: 0.5 mg via INTRAVENOUS
  Filled 2023-06-29: qty 0.5

## 2023-06-29 MED ORDER — LORAZEPAM 2 MG/ML IJ SOLN
INTRAMUSCULAR | Status: AC
  Filled 2023-06-29: qty 1

## 2023-06-29 MED ORDER — GABAPENTIN 300 MG PO CAPS
400.0000 mg | ORAL_CAPSULE | Freq: Three times a day (TID) | ORAL | Status: DC
  Administered 2023-07-01 – 2023-07-06 (×16): 400 mg via ORAL
  Filled 2023-06-29 (×17): qty 1

## 2023-06-29 MED ORDER — INSULIN ASPART 100 UNIT/ML IJ SOLN
10.0000 [IU] | Freq: Once | INTRAMUSCULAR | Status: AC
  Administered 2023-06-29: 10 [IU] via SUBCUTANEOUS
  Filled 2023-06-29: qty 1

## 2023-06-29 MED ORDER — ONDANSETRON HCL 4 MG/2ML IJ SOLN
4.0000 mg | Freq: Three times a day (TID) | INTRAMUSCULAR | Status: DC | PRN
  Administered 2023-06-30 – 2023-07-04 (×4): 4 mg via INTRAVENOUS
  Filled 2023-06-29 (×4): qty 2

## 2023-06-29 NOTE — Progress Notes (Signed)
 CODE STROKE- PHARMACY COMMUNICATION   Time CODE STROKE called/page received:[id number]  Time response to CODE STROKE was made (in person or via phone): in person  Time Stroke Kit retrieved from Py[employer]is (only if needed):***  Name of Provider/Nurse contacted:***  Past Medical History:  Diagnosis Date   Diabetes mellitus without complication (HCC)    niddm   Prior to Admission medications   Medication Sig Start Date End Date Taking? Authorizing Provider  alendronate (FOSAMAX) 70 MG tablet Take 70 mg by mouth once a week. Patient not taking: Reported on 01/20/2022 08/11/21   [provider]  Cholecalciferol (VITAMIN D -3) 1000 units CAPS Take by mouth daily.    [provider]  famotidine  (PEPCID ) 40 MG tablet Take 1 tablet (40 mg total) by mouth every evening. 11/05/18 11/05/19  Goodman, Graydon, MD  gabapentin (NEURONTIN) 400 MG capsule Take 400 mg by mouth 3 (three) times daily. 08/02/21   [provider]  glipiZIDE (GLUCOTROL XL) 10 MG 24 hr tablet Take 20 mg by mouth daily. 10/19/21   [provider]  JARDIANCE 25 MG TABS tablet Take 25 mg by mouth daily. 08/02/21   [provider]  levocetirizine (XYZAL) 5 MG tablet Take 5 mg by mouth daily. 01/18/22   [provider]  Lidocaine  HCl-Benzyl Alcohol (SALONPAS LIDOCAINE  PLUS) 4-10 % CREA Apply 1 Application topically at bedtime.    [provider]  Lidocaine -Menthol (ICY HOT MAX LIDOCAINE ) 4-1 % CREA Apply 1 Application topically at bedtime.    [provider]  lisinopril (PRINIVIL,ZESTRIL) 10 MG tablet Take 10 mg by mouth daily.    [provider]  metFORMIN (GLUCOPHAGE) 1000 MG tablet Take 1,000 mg by mouth daily. 09/14/21   [provider]  metoCLOPramide  (REGLAN ) 10 MG tablet Take 10 mg by mouth 4 (four) times daily. 01/18/22   [provider]  montelukast (SINGULAIR) 10 MG tablet Take 10 mg by mouth at bedtime.    [provider]   promethazine  (PHENERGAN ) 25 MG tablet Take 25 mg by mouth. Patient not taking: Reported on 01/20/2022    [provider]    Ananias Balls ,PharmD Clinical Pharmacist  06/29/2023  6:12 PM

## 2023-06-29 NOTE — H&P (Addendum)
 History and Physical    Meredith Martinez XBJ:[id number] DOB: 26-Jul-1950 DOA: 06/29/2023  Referring MD/NP/PA:   PCP: [facility]   Patient coming from:  The patient is coming from home.     Chief Complaint: AMS and headache  HPI: Meredith Martinez is a 73 y.o. female with medical history significant of HTN, HLD, DM, GERD, gastroparesis, diabetic neuropathy, chronic pain syndrome, who presents with altered mental status and headache.  Per ED physician, patient initially was confused, being very combative, given 2mg  Ativan, became calm down and sleepy.  When I saw patient in ED, she is still confused and in deep sleep, could not provide any history.  Her sister is at bedside, who speaks [place].  Part of history is obtained from her sister with iPad interpreter's help.  Per her sister, patient was last known normal at about 12:30 when she talked to pt on the phone.  Per ED physician, patient reported sudden onset of severe headache behind her right eye, associated with dizziness and confusion.  She has nausea and multiple times of nonbilious nonbloody vomiting.  Patient is combative, obviously able moves all extremities, no facial droop or slurred speech noted.  No respiratory distress, active cough, SOB noted.  Patient does not seem to have chest pain or abdominal pain.  Not sure if patient has symptoms of UTI.  Data reviewed independently and ED Course: pt was found to have WBC 7.4, GFR> 60, INR 1.0, PTT 26, temperature normal, blood pressure 148/63, heart rate 58, RR 18, oxygen saturation 96 percent.  CT of head showed possible right cerebellar infarct.  CTA of head and neck negative for LVO.  Patient is placed in PCU for observation.  Dr. Doretta Gant of neurology is consulted.  CT-head 1. Suspected recent right cerebellar infarct. Recommend MRI for further evaluation. 2. No intracranial hemorrhage or evidence of an acute supratentorial infarct. ASPECTS of 10. 3. Mild  chronic small vessel ischemic disease.    EKG: I have personally reviewed.  Sinus rhythm, QTc 497, LAE, low voltage, poor R wave progression   Review of Systems: Could not be reviewed due to altered mental status.  Allergy:  Allergies  Allergen Reactions   Amoxicillin Nausea And Vomiting   Iodine     Past Medical History:  Diagnosis Date   Diabetes mellitus without complication (HCC)    niddm    Past Surgical History:  Procedure Laterality Date   BREAST BIOPSY Right    neg   ESOPHAGOGASTRODUODENOSCOPY (EGD) WITH PROPOFOL  N/A 06/27/2017   Procedure: ESOPHAGOGASTRODUODENOSCOPY (EGD) WITH PROPOFOL ;  Surgeon: Luke Salaam, MD;  Location: [facility] ENDOSCOPY;  Service: Gastroenterology;  Laterality: N/A;    Social History:  reports that she quit smoking about 17 years ago. Her smoking use included cigarettes. She has never used smokeless tobacco. She reports that she does not drink alcohol and does not use drugs.  Family History:  Family History  Problem Relation Age of Onset   Heart disease Mother    Diabetes Mother    Arthritis Mother    Cancer Mother    Breast cancer Mother 55   Heart disease Father    Stroke Father    Diabetes Father      Prior to Admission medications   Medication Sig Start Date End Date Taking? Authorizing Provider  alendronate (FOSAMAX) 70 MG tablet Take 70 mg by mouth once a week. Patient not taking: Reported on 01/20/2022 08/11/21   [provider]  Cholecalciferol (VITAMIN D -3) 1000  units CAPS Take by mouth daily.    [provider]  famotidine  (PEPCID ) 40 MG tablet Take 1 tablet (40 mg total) by mouth every evening. 11/05/18 11/05/19  Goodman, Graydon, MD  gabapentin (NEURONTIN) 400 MG capsule Take 400 mg by mouth 3 (three) times daily. 08/02/21   [provider]  glipiZIDE (GLUCOTROL XL) 10 MG 24 hr tablet Take 20 mg by mouth daily. 10/19/21   [provider]  JARDIANCE 25 MG TABS tablet Take 25 mg by mouth daily.  08/02/21   [provider]  levocetirizine (XYZAL) 5 MG tablet Take 5 mg by mouth daily. 01/18/22   [provider]  Lidocaine  HCl-Benzyl Alcohol (SALONPAS LIDOCAINE  PLUS) 4-10 % CREA Apply 1 Application topically at bedtime.    [provider]  Lidocaine -Menthol (ICY HOT MAX LIDOCAINE ) 4-1 % CREA Apply 1 Application topically at bedtime.    [provider]  lisinopril (PRINIVIL,ZESTRIL) 10 MG tablet Take 10 mg by mouth daily.    [provider]  metFORMIN (GLUCOPHAGE) 1000 MG tablet Take 1,000 mg by mouth daily. 09/14/21   [provider]  metoCLOPramide  (REGLAN ) 10 MG tablet Take 10 mg by mouth 4 (four) times daily. 01/18/22   [provider]  montelukast (SINGULAIR) 10 MG tablet Take 10 mg by mouth at bedtime.    [provider]  promethazine  (PHENERGAN ) 25 MG tablet Take 25 mg by mouth. Patient not taking: Reported on 01/20/2022    [provider]    Physical Exam: Vitals:   06/29/23 1821 06/29/23 1824 06/29/23 2116  BP: (!) 148/63    Pulse: (!) 58    Resp: 18    Temp:  97.6 F (36.4 C)   TempSrc: Oral Axillary   SpO2: 96%    Weight: 60.7 kg    Height:   4\' 11"  (1.499 m)   General: Not in acute distress HEENT:       Eyes: PERRL, EOMI, no jaundice       ENT: No discharge from the ears and nose       Neck: No JVD, no bruit, no mass felt. Heme: No neck lymph node enlargement. Cardiac: S1/S2, RRR, No murmurs, No gallops or rubs. Respiratory: No rales, wheezing, rhonchi or rubs. GI: Soft, nondistended, nontender, no organomegaly, BS present. GU: No hematuria Ext: No pitting leg edema bilaterally. 1+DP/PT pulse bilaterally. Musculoskeletal: No joint deformities, No joint redness or warmth, no limitation of ROM in spin. Skin: No rashes.  Neuro: When I saw pt in ED, she is confused, in deep sleep. When I tried to wake pt up, she becomes combative, not following command, not able to provide any history.  Cranial nerves II-XII grossly intact, moves all extremities.  Physical examination is limited.  Psych: Patient is not psychotic, no suicidal or hemocidal ideation.  Labs on Admission: I have personally reviewed following labs and imaging studies  CBC: Recent Labs  Lab 06/29/23 1732  WBC 7.4  NEUTROABS 2.3  HGB 13.9  HCT 40.2  MCV 86.6  PLT 308   Basic Metabolic Panel: Recent Labs  Lab 06/29/23 1732  NA 131*  K 3.6  CL 96*  CO2 25  GLUCOSE 395*  BUN 12  CREATININE 0.69  CALCIUM 9.7   GFR: Estimated Creatinine Clearance: 51.9 mL/min (by C-G formula based on SCr of 0.69 mg/dL). Liver Function Tests: Recent Labs  Lab 06/29/23 1732  AST 22  ALT 23  ALKPHOS 109  BILITOT 0.8  PROT 8.1  ALBUMIN  4.0   No results for input(s): "LIPASE", "AMYLASE" in the last 168 hours. No results for input(s): "AMMONIA" in the last 168 hours. Coagulation Profile: Recent Labs  Lab 06/29/23 1732  INR 1.0   Cardiac Enzymes: No results for input(s): "CKTOTAL", "CKMB", "CKMBINDEX", "TROPONINI" in the last 168 hours. BNP (last 3 results) No results for input(s): "PROBNP" in the last 8760 hours. HbA1C: No results for input(s): "HGBA1C" in the last 72 hours. CBG: Recent Labs  Lab 06/29/23 1729  GLUCAP 403*   Lipid Profile: No results for input(s): "CHOL", "HDL", "LDLCALC", "TRIG", "CHOLHDL", "LDLDIRECT" in the last 72 hours. Thyroid Function Tests: No results for input(s): "TSH", "T4TOTAL", "FREET4", "T3FREE", "THYROIDAB" in the last 72 hours. Anemia Panel: No results for input(s): "VITAMINB12", "FOLATE", "FERRITIN", "TIBC", "IRON", "RETICCTPCT" in the last 72 hours. Urine analysis:    Component Value Date/Time   COLORURINE STRAW (A) 11/04/2018 2035   APPEARANCEUR CLEAR (A) 11/04/2018 2035   LABSPEC 1.031 (H) 11/04/2018 2035   PHURINE 5.0 11/04/2018 2035   GLUCOSEU >=500 (A) 11/04/2018 2035   HGBUR SMALL (A) 11/04/2018 2035   BILIRUBINUR NEGATIVE 11/04/2018 2035    KETONESUR NEGATIVE 11/04/2018 2035   PROTEINUR NEGATIVE 11/04/2018 2035   NITRITE NEGATIVE 11/04/2018 2035   LEUKOCYTESUR TRACE (A) 11/04/2018 2035   Sepsis Labs: @LABRCNTIP (procalcitonin:4,lacticidven:4) )No results found for this or any previous visit (from the past 240 hours).   Radiological Exams on Admission:   Assessment/Plan Principal Problem:   Stroke [facility] Active Problems:   Acute metabolic encephalopathy   Type 2 diabetes mellitus with peripheral neuropathy (HCC)   Hypertension associated with type 2 diabetes mellitus (HCC)   Hyperlipidemia associated with type 2 diabetes mellitus (HCC)   Diabetic neuropathy (HCC)   Nausea & vomiting   Gastroparesis   Chronic pain syndrome   Assessment and Plan:  Stroke [facility]): Patient presents with sudden onset headache and altered mental status, no weakness in extremities.  CT of head showed possible right cerebellar infarct.  CTA of head and neck negative for LVO.  Consulted Dr. Doretta Gant of neurology   -Placed on tele bed for observation - Obtain MRI-brain  - will hold oral Bp meds to allow permissive HTN in the setting of acute stroke - Per Dr. Doretta Gant, will start ASA 81mg  daily + plavix 75mg  daily x21 days f/b ASA 81mg  daily monotherapy after that  - Statin: Lipitor 80 mg daily - fasting lipid panel and HbA1c  - 2D transthoracic echocardiography  - swallowing screen. If fails, will get SLP - Check UDS  - PT/OT consult  Acute metabolic encephalopathy: Due to stroke.  Patient is confused and combative - As needed Haldol - Fall precaution - Frequent neurocheck - f/u UA  Type 2 diabetes mellitus with peripheral neuropathy [facility]): Recent A1c 12.5, poorly controlled.  Patient has glipizide, Jardiance and metformin on med list.  Her blood sugar is 403 in ED - Start glargine insulin  10 units daily - Sliding scale insulin   Hypertension associated with type 2 diabetes mellitus (HCC) -Hold lisinopril -prn IV hydralazine for SBP>220  or dBP>120  Hyperlipidemia associated with type 2 diabetes mellitus (HCC) -Lipitor  Diabetic neuropathy (HCC) -Neurontin  Nausea vomiting and history of gastroparesis: Patient has multiple episodes of nausea and vomiting, which is likely due to gastroparesis.  Since patient is very confused, cannot provide detailed information, will get CT scan to rule out blockage. - Reglan  10 mg 3 times daily IV -As needed Zofran  -Check lipase - Follow-up CT scan of  abdomen/pelvis   Chronic pain syndrome - Neurontin - As needed morphine  for severe pain      DVT ppx:  SQ Lovenox   Code Status: Full code   Family Communication:  Yes, patient's sister at bed side.   Disposition Plan:  Anticipate discharge back to previous environment  Consults called:  Dr. Doretta Gant of neuro  Admission status and Level of care: Progressive:    for obs as inpt        Dispo: The patient is from: Home              Anticipated d/c is to: Home              Anticipated d/c date is: 1 day              Patient currently is not medically stable to d/c.    Severity of Illness:  The appropriate patient status for this patient is OBSERVATION. Observation status is judged to be reasonable and necessary in order to provide the required intensity of service to ensure the patient's safety. The patient's presenting symptoms, physical exam findings, and initial radiographic and laboratory data in the context of their medical condition is felt to place them at decreased risk for further clinical deterioration. Furthermore, it is anticipated that the patient will be medically stable for discharge from the hospital within 2 midnights of admission.        Date of Service 06/29/2023    Fidencio Hue [facility]   If 7PM-7AM, please contact night-coverage [email or web address] 06/29/2023, 10:33 PM

## 2023-06-29 NOTE — ED Notes (Signed)
 Unable to assess accurate NIH due to patient confusion.

## 2023-06-29 NOTE — Progress Notes (Signed)
   06/29/23 1900  Spiritual Encounters  Type of Visit Initial  Care provided to: Patient  Conversation partners present during encounter Nurse  Reason for visit Code  OnCall Visit No   Chaplain responded to CODE STROKE earlier but the patient was in CT and no family was present.  Chaplain returned to check on patient after testing and she was in a confused and nauseated state.  Chaplain tried to assist staff with settling the patient and offering a calming presence.  Chaplain let staff know that the Chaplain would be in [facility] for the evening and to call if needed.    Rev. Rana M. Nolon Baxter, M.Div. Chaplain Resident [facility]

## 2023-06-29 NOTE — ED Notes (Signed)
 Patient transported to CT1 on EMS stretcher; this RN at bedside.

## 2023-06-29 NOTE — ED Notes (Signed)
 Patient having difficulty responding to commands, unable to fully complete NIHS due to this reason.

## 2023-06-29 NOTE — Progress Notes (Signed)
 AH Telestroke RN Code stroke Note:  1727- Activation 1729- Neuro paged, pt on the way to CT 1736-Dr Stack on camera, pt is in CT 1738- NIH started 1808- Pt taken back to room   LKW - unclear MRS 0

## 2023-06-29 NOTE — Consult Note (Signed)
 NEUROLOGY TELESTROKE CONSULT NOTE   Date of service: Jun 29, 2023 Patient Name: Meredith Martinez MRN:  [id number] DOB:  December 13, 1950 Chief Complaint: headache, n/v Requesting Provider: Arline Bennett, MD  Consult Participants: myself, patient, bedside RN, telestroke RN Location of the provider: [facility], [place] Location of the patient: [facility]  This consult was provided via telemedicine with 2-way video and audio communication. The patient/family was informed that care would be provided in this way and agreed to receive care in this manner.   History of Present Illness   This is a 73 yo woman with pmhx DM2 who presents for acute onset worst headache of her life behind R eye and dizziness.  And route with EMS she became confused.  She is unable to provide history during my interview over video.  She just reports that she has severe pain behind her right eye and this is the worst headache she is ever had.  She vomited multiple times during the examination.  Her exam was nonfocal.  She did have significant intention tremor in her finger-nose-finger bilaterally but no clear ataxia.  CT head personal review was negative for intracranial hemorrhage but she did have evidence of an acute to subacute right cerebellar infarct.  When asked specifically patient did state that she was feeling dizzy but that her chief complaint was headache.  Patient was unable to confirm last known well and I was unable to get in touch with any family specifically the niece of the patient who was reported to have been with her.  I attempted multiple times and her phone went straight to voicemail.  I also called her sister who did not pick up the phone.  Therefore because we were unable to confirm last known well she was not eligible for TNK.  CTA head and neck showed no LVO therefore she was not eligible for intervention.   LKW: today per EMS unable to confirm time Modified rankin score: 1-No significant post stroke disability and can  perform usual duties with stroke symptoms - unable to confirm IV Thrombolysis: No, unknown last known well  NIHSS components Score: Comment  1a Level of Conscious 0[x]  1[]  2[]  3[]      1b LOC Questions 0[]  1[x]  2[]       1c LOC Commands 0[x]  1[]  2[]       2 Best Gaze 0[x]  1[]  2[]       3 Visual 0[x]  1[]  2[]  3[]      4 Facial Palsy 0[x]  1[]  2[]  3[]      5a Motor Arm - left 0[x]  1[]  2[]  3[]  4[]  UN[]    5b Motor Arm - Right 0[x]  1[]  2[]  3[]  4[]  UN[]    6a Motor Leg - Left 0[x]  1[]  2[]  3[]  4[]  UN[]    6b Motor Leg - Right 0[x]  1[]  2[]  3[]  4[]  UN[]    7 Limb Ataxia 0[x]  1[]  2[]  3[]  UN[]     8 Sensory 0[x]  1[]  2[]  UN[]      9 Best Language 0[x]  1[]  2[]  3[]      10 Dysarthria 0[x]  1[]  2[]  UN[]      11 Extinct. and Inattention 0[x]  1[]  2[]       TOTAL:  1      ROS   Unable to ascertain due to mild confusion and pain  Past History   Past Medical History:  Diagnosis Date   Diabetes mellitus without complication (HCC)    niddm    Past Surgical History:  Procedure Laterality Date   BREAST BIOPSY Right    neg  ESOPHAGOGASTRODUODENOSCOPY (EGD) WITH PROPOFOL  N/A 06/27/2017   Procedure: ESOPHAGOGASTRODUODENOSCOPY (EGD) WITH PROPOFOL ;  Surgeon: Luke Salaam, MD;  Location: [facility] ENDOSCOPY;  Service: Gastroenterology;  Laterality: N/A;    Family History: Family History  Problem Relation Age of Onset   Heart disease Mother    Diabetes Mother    Arthritis Mother    Cancer Mother    Breast cancer Mother 15   Heart disease Father    Stroke Father    Diabetes Father     Social History  reports that she quit smoking about 17 years ago. Her smoking use included cigarettes. She has never used smokeless tobacco. She reports that she does not drink alcohol and does not use drugs.  Allergies  Allergen Reactions   Amoxicillin Nausea And Vomiting   Iodine     Medications   Current Facility-Administered Medications:    HYDROmorphone (DILAUDID) injection 0.5 mg, 0.5 mg, Intravenous, Once, Eleni Griffin, MD   iohexol (OMNIPAQUE) 350 MG/ML injection 100 mL, 100 mL, Intravenous, Once PRN, Eleni Griffin, MD   ondansetron  (ZOFRAN ) injection 4 mg, 4 mg, Intravenous, Once, Eleni Griffin, MD   sodium chloride  flush (NS) 0.9 % injection 3 mL, 3 mL, Intravenous, Once, Arline Bennett, MD  Current Outpatient Medications:    alendronate (FOSAMAX) 70 MG tablet, Take 70 mg by mouth once a week. (Patient not taking: Reported on 01/20/2022), Disp: , Rfl:    Cholecalciferol (VITAMIN D -3) 1000 units CAPS, Take by mouth daily., Disp: , Rfl:    famotidine  (PEPCID ) 40 MG tablet, Take 1 tablet (40 mg total) by mouth every evening., Disp: 30 tablet, Rfl: 1   gabapentin (NEURONTIN) 400 MG capsule, Take 400 mg by mouth 3 (three) times daily., Disp: , Rfl:    glipiZIDE (GLUCOTROL XL) 10 MG 24 hr tablet, Take 20 mg by mouth daily., Disp: , Rfl:    JARDIANCE 25 MG TABS tablet, Take 25 mg by mouth daily., Disp: , Rfl:    levocetirizine (XYZAL) 5 MG tablet, Take 5 mg by mouth daily., Disp: , Rfl:    Lidocaine  HCl-Benzyl Alcohol (SALONPAS LIDOCAINE  PLUS) 4-10 % CREA, Apply 1 Application topically at bedtime., Disp: , Rfl:    Lidocaine -Menthol (ICY HOT MAX LIDOCAINE ) 4-1 % CREA, Apply 1 Application topically at bedtime., Disp: , Rfl:    lisinopril (PRINIVIL,ZESTRIL) 10 MG tablet, Take 10 mg by mouth daily., Disp: , Rfl:    metFORMIN (GLUCOPHAGE) 1000 MG tablet, Take 1,000 mg by mouth daily., Disp: , Rfl:    metoCLOPramide  (REGLAN ) 10 MG tablet, Take 10 mg by mouth 4 (four) times daily., Disp: , Rfl:    montelukast (SINGULAIR) 10 MG tablet, Take 10 mg by mouth at bedtime., Disp: , Rfl:    promethazine  (PHENERGAN ) 25 MG tablet, Take 25 mg by mouth. (Patient not taking: Reported on 01/20/2022), Disp: , Rfl:   Vitals  There were no vitals filed for this visit.  There is no height or weight on file to calculate BMI.  Physical Exam   Gen: visibly distressed, holding the R side of her head in pain Resp: normal  WOB CV: extremities appear well-perfused  Neurologic Examination   Neuro: *MS: oriented x3 but confused in conversation and focused on pain. Oriented to age but not month *Speech: nondysarthric, no aphasia, able to name and repeat *CN: PERRL 3mm, EOMI, VFF by confrontation, sensation intact, smile symmetric, hearing intact to voice *Motor:   Normal bulk.  No tremor, rigidity or bradykinesia. No pronator  drift. All extremities appear full-strength and symmetric. *Sensory: SILT. Symmetric. No double-simultaneous extinction.  *Coordination:  Prominent intention tremor bilat on FNF without frank ataxia *Reflexes:  UTA 2/2 tele-exam *Gait: deferred  Labs/Imaging/Neurodiagnostic studies   CBC:  Recent Labs  Lab 07/28/2023 1732  WBC 7.4  NEUTROABS 2.3  HGB 13.9  HCT 40.2  MCV 86.6  PLT 308   Basic Metabolic Panel:  Lab Results  Component Value Date   NA 138 10/19/2021   K 4.6 10/19/2021   CO2 26 07/03/2021   GLUCOSE 278 (H) 10/19/2021   BUN 9 10/19/2021   CREATININE 0.70 10/19/2021   CALCIUM 10.1 10/19/2021   GFRNONAA >60 07/03/2021   GFRAA >60 11/04/2018   Lipid Panel: No results found for: "LDLCALC" HgbA1c:  Lab Results  Component Value Date   HGBA1C 12.5 (H) 10/19/2021   Urine Drug Screen: No results found for: "LABOPIA", "COCAINSCRNUR", "LABBENZ", "AMPHETMU", "THCU", "LABBARB"  Alcohol Level No results found for: "ETH" INR No results found for: "INR" APTT No results found for: "APTT" AED levels: No results found for: "PHENYTOIN", "ZONISAMIDE", "LAMOTRIGINE", "LEVETIRACETA"  CT Head without contrast(Personally reviewed): Evolving R cerebellar infarct  CT angio Head and Neck with contrast(Personally reviewed): No LVO   ASSESSMENT   This is a 73 year old woman with past medical history significant for diabetes who presented with severe headache, dizziness, and confusion and was found to have an evolving right cerebellar infarct on CT.  Initially we were unable to  get in touch with any family and then at 7pm we were able to get her sister on the phone who stated she spoke with patient at 1430 and she was normal. At that point her last known well was at 4.5 hours and we were unable to get any collateral to suggest otherwise therefore she was deemed not eligible for TNK. No LVO on CTA.  RECOMMENDATIONS   - Admit for stroke workup - Permissive HTN x48 hrs from sx onset or until stroke ruled out by MRI goal BP <220/120. PRN labetalol or hydralazine if BP above these parameters. Avoid oral antihypertensives. - MRI brain wo contrast - TTE  - Check A1c and LDL + add statin per guidelines - ASA 81mg  daily + plavix 75mg  daily x21 days f/b ASA 81mg  daily monotherapy after that - q4 hr neuro checks - STAT head CT for any change in neuro exam - Tele - PT/OT/SLP - Stroke education - Amb referral to neurology upon discharge   Will continue to follow.  ______________________________________________________________________    Signed, Eleni Griffin, MD [facility]

## 2023-06-29 NOTE — ED Provider Notes (Signed)
 [facility] Provider Note    Event Date/Time   First MD Initiated Contact with Patient 06/29/23 1729     (approximate)   History   Code Stroke   HPI  Meredith Martinez is a 73 y.o. female who presents to the ED for evaluation of Code Stroke   Patient presents to the ED from home via EMS for evaluation of severe headache, nausea vomiting.  History is limited.  She is not following commands and not saying very much beyond that she is in pain.  Code stroke is activated from triage.   Physical Exam   Triage Vital Signs: ED Triage Vitals  Encounter Vitals Group     BP      Systolic BP Percentile      Diastolic BP Percentile      Pulse      Resp      Temp      Temp src      SpO2      Weight      Height      Head Circumference      Peak Flow      Pain Score      Pain Loc      Pain Education      Exclude from Growth Chart     Most recent vital signs: Vitals:   06/29/23 1821 06/29/23 1824  BP: (!) 148/63   Pulse: (!) 58   Resp: 18   Temp:  97.6 F (36.4 C)  SpO2: 96%     General: Awake, no distress.  Seems hyperalgesic, swatting everyone away when I run tries to examine her, place IV.  Moving all 4 extremities actively vomiting.Aaron Aas  CV:  Good peripheral perfusion.  Resp:  Normal effort.  Abd:  No distention.  MSK:  No deformity noted.  Neuro:  No focal deficits appreciated. Other:     ED Results / Procedures / Treatments   Labs (all labs ordered are listed, but only abnormal results are displayed) Labs Reviewed  DIFFERENTIAL - Abnormal; Notable for the following components:      Result Value   Lymphs Abs 4.3 (*)    All other components within normal limits  COMPREHENSIVE METABOLIC PANEL WITH GFR - Abnormal; Notable for the following components:   Sodium 131 (*)    Chloride 96 (*)    Glucose, Bld 395 (*)    All other components within normal limits  CBG MONITORING, ED - Abnormal; Notable for the following components:    Glucose-Capillary 403 (*)    All other components within normal limits  PROTIME-INR  APTT  CBC  ETHANOL  URINE DRUG SCREEN, QUALITATIVE ([facility] ONLY)  HIV ANTIBODY (ROUTINE TESTING W REFLEX)  LIPID PANEL  HEMOGLOBIN A1C  CBC  BASIC METABOLIC PANEL WITH GFR  CBG MONITORING, ED    EKG Sinus rhythm with a rate of 60 bpm.  Normal axis and intervals.  No clear signs of acute ischemia.  RADIOLOGY CT head interpreted by me with signs of a cerebellar infarct on the right, no bleed. CTA head/neck without LVO  Official radiology report(s): CT ANGIO HEAD NECK W WO CM W PERF (CODE STROKE) Result Date: 06/29/2023 CLINICAL DATA:  Neuro deficit, acute, stroke suspected worst headache of life, r/o aneurysm or dissection. Dizziness and vomiting. EXAM: CT ANGIOGRAPHY HEAD AND NECK CT PERFUSION BRAIN TECHNIQUE: Multidetector CT imaging of the head and neck was performed using the standard protocol during bolus administration of intravenous contrast.  Multiplanar CT image reconstructions and MIPs were obtained to evaluate the vascular anatomy. Carotid stenosis measurements (when applicable) are obtained utilizing NASCET criteria, using the distal internal carotid diameter as the denominator. Multiphase CT imaging of the brain was performed following IV bolus contrast injection. Subsequent parametric perfusion maps were calculated using RAPID software. RADIATION DOSE REDUCTION: This exam was performed according to the departmental dose-optimization program which includes automated exposure control, adjustment of the mA and/or kV according to patient size and/or use of iterative reconstruction technique. CONTRAST:  100mL OMNIPAQUE IOHEXOL 350 MG/ML SOLN COMPARISON:  None Available. FINDINGS: CTA NECK FINDINGS Aortic arch: Standard branching with mild atherosclerosis. No significant stenosis of the arch vessel origin. Right carotid system: Patent with a small amount of calcified plaque in the proximal ICA. No  significant stenosis or dissection. Left carotid system: Patent with a small amount of calcified and soft plaque in the carotid bulb. No evidence of a significant stenosis or dissection. Vertebral arteries: Patent without evidence of a stenosis, dissection, or significant atherosclerosis. Dominant left vertebral artery. Skeleton: Mild cervical spondylosis. Other neck: No evidence of cervical lymphadenopathy or mass. Upper chest: Mild biapical lung scarring. Mild bronchial wall thickening. Partially visualized small left hilar lymph nodes. Review of the MIP images confirms the above findings CTA HEAD FINDINGS Anterior circulation: The internal carotid arteries are patent from skull base to carotid termini with minimal nonstenotic plaque. ACAs and MCAs are patent without evidence of a proximal branch occlusion or proximal stenosis. No aneurysm is identified. Posterior circulation: The intracranial vertebral arteries are widely patent to the basilar. The basilar artery is widely patent. Posterior communicating arteries are diminutive or absent. The PCAs are patent without evidence of a significant proximal stenosis. There is a severe distal right P3 branch vessel stenosis. No aneurysm is identified. Venous sinuses: Poorly evaluated due to contrast timing. Anatomic variants: None. Review of the MIP images confirms the above findings CT Brain Perfusion Findings: The exam is motion degraded. ASPECTS: 10 CBF (<30%) Volume: 0 mL Perfusion (Tmax>6.0s) volume: 6mL, located peripherally along the inferior aspect of the left cerebellar hemisphere and along the anterior aspect of the falx and most likely artifactual Preliminary finding of no large vessel occlusion was communicated by telephone to Dr. Doretta Gant on 06/29/2023 at 6:13 p.m. IMPRESSION: 1. Mild atherosclerosis in the head and neck without a large vessel occlusion or significant proximal stenosis. 2. Motion degraded, noncontributory CTP. 3.  Aortic Atherosclerosis  (ICD10-I70.0). Electronically Signed   By: Aundra Lee M.D.   On: 06/29/2023 18:34   CT HEAD CODE STROKE WO CONTRAST Result Date: 06/29/2023 CLINICAL DATA:  Code stroke. Neuro deficit, acute, stroke suspected. EXAM: CT HEAD WITHOUT CONTRAST TECHNIQUE: Contiguous axial images were obtained from the base of the skull through the vertex without intravenous contrast. RADIATION DOSE REDUCTION: This exam was performed according to the departmental dose-optimization program which includes automated exposure control, adjustment of the mA and/or kV according to patient size and/or use of iterative reconstruction technique. COMPARISON:  Head CT 07/03/2021 FINDINGS: Brain: A new small hypodense region in the right cerebellar hemisphere is indeterminate but suspicious for a recent infarct. No acute supratentorial infarct, intracranial hemorrhage, midline shift, or extra-axial fluid collection is identified. Mild cerebral atrophy is within normal limits for age. Cerebral white matter hypodensities are nonspecific but compatible with mild chronic small vessel ischemic disease. Vascular: Calcified atherosclerosis at the skull base. No hyperdense vessel. Skull: No fracture or suspicious lesion. Sinuses/Orbits: Mild mucosal thickening in the right maxillary  sinus. Clear mastoid air cells. Unremarkable orbits. Other: None. ASPECTS (Alberta Stroke Program Early CT Score) - Ganglionic level infarction (caudate, lentiform nuclei, internal capsule, insula, M1-M3 cortex): 7 - Supraganglionic infarction (M4-M6 cortex): 3 Total score (0-10 with 10 being normal): 10 These results were communicated to Dr. Doretta Gant at 5:49 pm on 06/29/2023 by text page via the [facility] messaging system. IMPRESSION: 1. Suspected recent right cerebellar infarct. Recommend MRI for further evaluation. 2. No intracranial hemorrhage or evidence of an acute supratentorial infarct. ASPECTS of 10. 3. Mild chronic small vessel ischemic disease. Electronically Signed   By:  Aundra Lee M.D.   On: 06/29/2023 17:51    PROCEDURES and INTERVENTIONS:  .1-3 Lead EKG Interpretation  Performed by: Arline Bennett, MD Authorized by: Arline Bennett, MD     Interpretation: normal     ECG rate:  61   ECG rate assessment: normal     Rhythm: sinus rhythm     Ectopy: none     Conduction: normal   .Critical Care  Performed by: Arline Bennett, MD Authorized by: Arline Bennett, MD   Critical care provider statement:    Critical care time (minutes):  30   Critical care time was exclusive of:  Separately billable procedures and treating other patients   Critical care was necessary to treat or prevent imminent or life-threatening deterioration of the following conditions:  CNS failure or compromise   Critical care was time spent personally by me on the following activities:  Development of treatment plan with patient or surrogate, discussions with consultants, evaluation of patient's response to treatment, examination of patient, ordering and review of laboratory studies, ordering and review of radiographic studies, ordering and performing treatments and interventions, pulse oximetry, re-evaluation of patient's condition and review of old charts   Medications  ondansetron  (ZOFRAN ) injection 4 mg (has no administration in time range)  hydrALAZINE (APRESOLINE) injection 5 mg (has no administration in time range)  atorvastatin (LIPITOR) tablet 80 mg (has no administration in time range)  aspirin EC tablet 81 mg (has no administration in time range)  clopidogrel (PLAVIX) tablet 75 mg (has no administration in time range)  insulin  glargine-yfgn (SEMGLEE) injection 10 Units (has no administration in time range)  insulin  aspart (novoLOG ) injection 0-15 Units (has no administration in time range)  insulin  aspart (novoLOG ) injection 0-5 Units (has no administration in time range)  insulin  aspart (novoLOG ) injection 10 Units (has no administration in time range)   stroke: early stages of  recovery book (has no administration in time range)  acetaminophen  (TYLENOL ) tablet 650 mg (has no administration in time range)    Or  acetaminophen  (TYLENOL ) 160 MG/5ML solution 650 mg (has no administration in time range)    Or  acetaminophen  (TYLENOL ) suppository 650 mg (has no administration in time range)  senna-docusate (Senokot-S) tablet 1 tablet (has no administration in time range)  enoxaparin  (LOVENOX ) injection 40 mg (has no administration in time range)  haloperidol lactate (HALDOL) injection 2 mg (has no administration in time range)  sodium chloride  flush (NS) 0.9 % injection 3 mL (3 mLs Intravenous Given 06/29/23 1817)  ondansetron  (ZOFRAN ) injection 4 mg (4 mg Intravenous Given 06/29/23 1816)  HYDROmorphone (DILAUDID) injection 0.5 mg (0.5 mg Intravenous Given 06/29/23 1816)  iohexol (OMNIPAQUE) 350 MG/ML injection 100 mL (100 mLs Intravenous Contrast Given 06/29/23 1814)  ondansetron  (ZOFRAN ) injection 4 mg (4 mg Intravenous Given 06/29/23 1816)  diphenhydrAMINE (BENADRYL) injection 50 mg (50 mg Intravenous Given 06/29/23 1817)  LORazepam (ATIVAN)  injection 2 mg (2 mg Intravenous Given 06/29/23 1933)     IMPRESSION / MDM / ASSESSMENT AND PLAN / ED COURSE  I reviewed the triage vital signs and the nursing notes.  Differential diagnosis includes, but is not limited to, ICH, ischemic stroke, polysubstance abuse or overdose, metabolic encephalopathy, DKA  {Patient presents with symptoms of an acute illness or injury that is potentially life-threatening.  Patient presents with severe headache, emesis and reports muscle mass with signs of a likely acute posterior stroke.  Signs of ischemic stroke involving the cerebellum on the right side on noncontrast CT.  No bleed.  No LVO.  Code stroke was activated, please see their documentation.  Hyperglycemia without signs of DKA.  Normal CBC.  Consult medicine for admission.  Consult with neurology.  Clinical Course as of 06/29/23 2103  Wed Jun 29, 2023  1802 I consult with Dr. Doretta Gant. Neuro.  [DS]  1807 I assess the patient in CT [DS]  1817 I consult with Dr. Doretta Gant again. No LVO. Not enough Hx for TNK since we don't know LKN. Keep her here 2 more hrs in case family shows up.  [DS]  1851 I consult with Dr. Doretta Gant again. Got a hold of patient's sister, but spanish speaking so cannot communicate.  We will have her in person interpreter, to help facilitate this phone call. [DS]  1904 We get patient sister on the phone, sister spoke with the patient around 2:30 PM and she seemed normal then.  This puts her outside of TNK window unfortunately.  I consult with neurology and update her of this information.  She recommends going ahead and admitting the patient here for medical management without TNK administration. [DS]  1610 Patient increasingly agitated, making nursing work difficult.  Try to get out of bed.  Will try to add on IV Ativan.  If this does not work, she may benefit from intubation to facilitate workup and safe working environment for staff. [DS]    Clinical Course User Index [DS] Arline Bennett, MD     FINAL CLINICAL IMPRESSION(S) / ED DIAGNOSES   Final diagnoses:  Cerebrovascular accident (CVA), unspecified mechanism (HCC)     Rx / DC Orders   ED Discharge Orders     None        Note:  This document was prepared using Dragon voice recognition software and may include unintentional dictation errors.   Arline Bennett, MD 06/29/23 2105

## 2023-06-29 NOTE — ED Triage Notes (Signed)
 Pt in via [facility] from home w/ complaints sudden onset severe headache w/ N/V around 1500 today.  Per EMS, patient has become confused since being en route to hospital.  Patient is vomiting on arrival, has trouble following commands.  Taken directly to CT.

## 2023-06-30 ENCOUNTER — Observation Stay

## 2023-06-30 ENCOUNTER — Inpatient Hospital Stay (HOSPITAL_COMMUNITY)
Admit: 2023-06-30 | Discharge: 2023-06-30 | Disposition: A | Discharge status: Home / Self Care | Attending: Internal Medicine | Admitting: Internal Medicine

## 2023-06-30 DIAGNOSIS — Z87891 Personal history of nicotine dependence: Secondary | ICD-10-CM | POA: Diagnosis not present

## 2023-06-30 DIAGNOSIS — I6389 Other cerebral infarction: Secondary | ICD-10-CM

## 2023-06-30 DIAGNOSIS — E1159 Type 2 diabetes mellitus with other circulatory complications: Secondary | ICD-10-CM | POA: Diagnosis present

## 2023-06-30 DIAGNOSIS — I63541 Cerebral infarction due to unspecified occlusion or stenosis of right cerebellar artery: Secondary | ICD-10-CM | POA: Diagnosis present

## 2023-06-30 DIAGNOSIS — Z833 Family history of diabetes mellitus: Secondary | ICD-10-CM | POA: Diagnosis not present

## 2023-06-30 DIAGNOSIS — K3184 Gastroparesis: Secondary | ICD-10-CM | POA: Diagnosis present

## 2023-06-30 DIAGNOSIS — E1142 Type 2 diabetes mellitus with diabetic polyneuropathy: Secondary | ICD-10-CM | POA: Diagnosis present

## 2023-06-30 DIAGNOSIS — Z8249 Family history of ischemic heart disease and other diseases of the circulatory system: Secondary | ICD-10-CM | POA: Diagnosis not present

## 2023-06-30 DIAGNOSIS — Z7983 Long term (current) use of bisphosphonates: Secondary | ICD-10-CM | POA: Diagnosis not present

## 2023-06-30 DIAGNOSIS — I152 Hypertension secondary to endocrine disorders: Secondary | ICD-10-CM | POA: Diagnosis present

## 2023-06-30 DIAGNOSIS — E785 Hyperlipidemia, unspecified: Secondary | ICD-10-CM | POA: Diagnosis present

## 2023-06-30 DIAGNOSIS — Z79899 Other long term (current) drug therapy: Secondary | ICD-10-CM | POA: Diagnosis not present

## 2023-06-30 DIAGNOSIS — Z823 Family history of stroke: Secondary | ICD-10-CM | POA: Diagnosis not present

## 2023-06-30 DIAGNOSIS — K219 Gastro-esophageal reflux disease without esophagitis: Secondary | ICD-10-CM | POA: Diagnosis present

## 2023-06-30 DIAGNOSIS — E1165 Type 2 diabetes mellitus with hyperglycemia: Secondary | ICD-10-CM | POA: Diagnosis present

## 2023-06-30 DIAGNOSIS — Z794 Long term (current) use of insulin: Secondary | ICD-10-CM | POA: Diagnosis not present

## 2023-06-30 DIAGNOSIS — R4182 Altered mental status, unspecified: Secondary | ICD-10-CM | POA: Diagnosis present

## 2023-06-30 DIAGNOSIS — Z881 Allergy status to other antibiotic agents status: Secondary | ICD-10-CM | POA: Diagnosis not present

## 2023-06-30 DIAGNOSIS — R29701 NIHSS score 1: Secondary | ICD-10-CM | POA: Diagnosis present

## 2023-06-30 DIAGNOSIS — Z7984 Long term (current) use of oral hypoglycemic drugs: Secondary | ICD-10-CM | POA: Diagnosis not present

## 2023-06-30 DIAGNOSIS — Z7982 Long term (current) use of aspirin: Secondary | ICD-10-CM | POA: Diagnosis not present

## 2023-06-30 DIAGNOSIS — E1143 Type 2 diabetes mellitus with diabetic autonomic (poly)neuropathy: Secondary | ICD-10-CM | POA: Diagnosis present

## 2023-06-30 DIAGNOSIS — Z888 Allergy status to other drugs, medicaments and biological substances status: Secondary | ICD-10-CM | POA: Diagnosis not present

## 2023-06-30 DIAGNOSIS — G894 Chronic pain syndrome: Secondary | ICD-10-CM | POA: Diagnosis present

## 2023-06-30 DIAGNOSIS — I639 Cerebral infarction, unspecified: Secondary | ICD-10-CM | POA: Diagnosis present

## 2023-06-30 DIAGNOSIS — Z7902 Long term (current) use of antithrombotics/antiplatelets: Secondary | ICD-10-CM | POA: Diagnosis not present

## 2023-06-30 DIAGNOSIS — G9341 Metabolic encephalopathy: Secondary | ICD-10-CM | POA: Diagnosis present

## 2023-06-30 LAB — LIPID PANEL
Cholesterol: 246 mg/dL — ABNORMAL HIGH (ref 0–200)
HDL: 56 mg/dL (ref >40)
LDL Cholesterol: 166 mg/dL — ABNORMAL HIGH (ref 0–99)
Total CHOL/HDL Ratio: 4.4 ratio
Triglycerides: 120 mg/dL (ref <150)
VLDL: 24 mg/dL (ref 0–40)

## 2023-06-30 LAB — BASIC METABOLIC PANEL WITH GFR
Anion gap: 10 (ref 5–15)
BUN: 13 mg/dL (ref 8–23)
CO2: 29 mmol/L (ref 22–32)
Calcium: 9.7 mg/dL (ref 8.9–10.3)
Chloride: 99 mmol/L (ref 98–111)
Creatinine, Ser: 0.71 mg/dL (ref 0.44–1.00)
GFR, Estimated: >60 mL/min (ref >60)
Glucose, Bld: 244 mg/dL — ABNORMAL HIGH (ref 70–99)
Potassium: 3.8 mmol/L (ref 3.5–5.1)
Sodium: 138 mmol/L (ref 135–145)

## 2023-06-30 LAB — CBC
HCT: 39.9 % (ref 36.0–46.0)
Hemoglobin: 13.4 g/dL (ref 12.0–15.0)
MCH: 29.8 pg (ref 26.0–34.0)
MCHC: 33.6 g/dL (ref 30.0–36.0)
MCV: 88.7 fL (ref 80.0–100.0)
Platelets: 314 10*3/uL (ref 150–400)
RBC: 4.5 MIL/uL (ref 3.87–5.11)
RDW: 11.8 % (ref 11.5–15.5)
WBC: 9.2 10*3/uL (ref 4.0–10.5)
nRBC: 0 % (ref 0.0–0.2)

## 2023-06-30 LAB — CBG MONITORING, ED
Glucose-Capillary: 251 mg/dL — ABNORMAL HIGH (ref 70–99)
Glucose-Capillary: 217 mg/dL — ABNORMAL HIGH (ref 70–99)

## 2023-06-30 LAB — ECHOCARDIOGRAM COMPLETE
Area-P 1/2: 3.08 cm2
Height: 59 in
S' Lateral: 2.4 cm
Weight: 2141.11 [oz_av]

## 2023-06-30 LAB — HIV ANTIBODY (ROUTINE TESTING W REFLEX): HIV Screen 4th Generation wRfx: NONREACTIVE

## 2023-06-30 LAB — GLUCOSE, CAPILLARY
Glucose-Capillary: 171 mg/dL — ABNORMAL HIGH (ref 70–99)
Glucose-Capillary: 116 mg/dL — ABNORMAL HIGH (ref 70–99)

## 2023-06-30 MED ORDER — SODIUM CHLORIDE 0.9 % IV SOLN: INTRAVENOUS | Status: AC

## 2023-06-30 MED ORDER — INSULIN ASPART 100 UNIT/ML IJ SOLN
0.0000 [IU] | Freq: Three times a day (TID) | INTRAMUSCULAR | Status: DC
  Administered 2023-06-30 – 2023-07-01 (×2): 3 [IU] via SUBCUTANEOUS
  Administered 2023-07-01: 8 [IU] via SUBCUTANEOUS
  Administered 2023-07-02: 3 [IU] via SUBCUTANEOUS
  Administered 2023-07-02: 5 [IU] via SUBCUTANEOUS
  Administered 2023-07-03: 3 [IU] via SUBCUTANEOUS
  Administered 2023-07-03 (×2): 5 [IU] via SUBCUTANEOUS
  Administered 2023-07-04: 11 [IU] via SUBCUTANEOUS
  Administered 2023-07-04: 5 [IU] via SUBCUTANEOUS
  Administered 2023-07-04: 8 [IU] via SUBCUTANEOUS
  Administered 2023-07-05: 2 [IU] via SUBCUTANEOUS
  Administered 2023-07-05: 8 [IU] via SUBCUTANEOUS
  Administered 2023-07-06: 3 [IU] via SUBCUTANEOUS
  Administered 2023-07-06: 5 [IU] via SUBCUTANEOUS
  Filled 2023-06-30 (×14): qty 1

## 2023-06-30 NOTE — Evaluation (Signed)
 Physical Therapy Evaluation Patient Details Name: Meredith Martinez MRN: [id number] DOB: 1951/01/30 Today's Date: 06/30/2023  History of Present Illness  Pt  is a 73 y.o. female who presents with AMS and headache. 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 PMH of HTN, HLD, DM, GERD, gastroparesis, diabetic neuropathy, chronic pain syndrome.   Clinical Impression  Pt received in Semi-Fowler's position and agreeable to therapy.  Pt with sitter in room stating that the pt has been unable to mobilize without getting nauseous.  Pt very confused when being aroused and therapists are still unsure if pt needs Spanish translator, but called in person Spanish interpreter to assist in case needed.  Pt confused and taking gown off throughout the session with PT and OT.  Pt was able to come upright and sit at the EOB before attempted to stand.  Both attempts for mobilization attempts utilized 2 person HHA to transfer.  Pt unsteady standing and using posterior to prop up against the bed.  Pt had bowel movement within the brief and also wet the bed, so bedding was changed with assistance from the sitter.  Pt ultimately left with all needs met and call bell within reach.        If plan is discharge home, recommend the following: Two people to help with walking and/or transfers;Two people to help with bathing/dressing/bathroom;Direct supervision/assist for medications management;Direct supervision/assist for financial management;Assist for transportation;Help with stairs or ramp for entrance   Can travel by private vehicle   No    Equipment Recommendations Other (comment) (TBD at next venue.)  Recommendations for Other Services       Functional Status Assessment Patient has had a recent decline in their functional status and demonstrates the ability to make significant improvements in function in a reasonable and predictable amount of time.     Precautions /  Restrictions Precautions Precautions: None Restrictions Weight Bearing Restrictions Per Provider Order: No      Mobility  Bed Mobility Overal bed mobility: Needs Assistance Bed Mobility: Supine to Sit     Supine to sit: Min assist     General bed mobility comments: HHA utilized for bed mobility to come upright into seated position.    Transfers Overall transfer level: Needs assistance Equipment used: 2 person hand held assist Transfers: Sit to/from Stand Sit to Stand: Min assist           General transfer comment: Pt able to stand at the edge of the stretcher, with elevated bed, however utilized posterior support on the stretch to remain in standing a lot of the stance time while linens were changed.    Ambulation/Gait               General Gait Details: deferred ultimately due to inconsistency with standing.  Stairs            Wheelchair Mobility     Tilt Bed    Modified Rankin (Stroke Patients Only)       Balance Overall balance assessment: Needs assistance Sitting-balance support: Bilateral upper extremity supported, Feet unsupported Sitting balance-Leahy Scale: Poor     Standing balance support: Single extremity supported, Bilateral upper extremity supported Standing balance-Leahy Scale: Poor Standing balance comment: Pt keeping posterior on the bed for support.                             Pertinent Vitals/Pain  Home Living Family/patient expects to be discharged to:: Unsure                   Additional Comments: in person interpreter used [place]; pt seemingly says yes she is IND using a cane; however very hard to tell d/t poor cognition    Prior Function Prior Level of Function : Patient poor historian/Family not available                     Extremity/Trunk Assessment   Upper Extremity Assessment Upper Extremity Assessment: Generalized weakness;RUE deficits/detail RUE Deficits / Details: R hand  appeared slightly fisted-unsure if this is new or her baseline    Lower Extremity Assessment Lower Extremity Assessment: Generalized weakness       Communication   Communication Communication: Impaired (utilized Systems developer in person interpreter for session)    Cognition Arousal: Obtunded Behavior During Therapy: Agitated, Impulsive   PT - Cognitive impairments: Difficult to assess Difficult to assess due to: Level of arousal                     PT - Cognition Comments: Pt not able to follow commands very well at times and also very nauseated when movement occurs. Following commands: Impaired Following commands impaired: Follows one step commands inconsistently     Cueing Cueing Techniques: Verbal cues, Gestural cues, Tactile cues     General Comments General comments (skin integrity, edema, etc.): pt vomited x1 during session, keeps eyes closed most of session; unable to follow commands consistently    Exercises     Assessment/Plan    PT Assessment Patient needs continued PT services  PT Problem List Decreased strength       PT Treatment Interventions DME instruction;Gait training;Stair training;Functional mobility training;Therapeutic activities;Therapeutic exercise;Balance training;Neuromuscular re-education    PT Goals (Current goals can be found in the Care Plan section)  Acute Rehab PT Goals Patient Stated Goal: unable to assess PT Goal Formulation: Patient unable to participate in goal setting Time For Goal Achievement: 07/14/23 Potential to Achieve Goals: Fair    Frequency Min 2X/week     Co-evaluation PT/OT/SLP Co-Evaluation/Treatment: Yes Reason for Co-Treatment: Complexity of the patient's impairments (multi-system involvement);Necessary to address cognition/behavior during functional activity;For patient/therapist safety;To address functional/ADL transfers PT goals addressed during session: Mobility/safety with mobility;Balance;Proper use of  DME OT goals addressed during session: ADL's and self-care;Proper use of Adaptive equipment and DME       AM-PAC PT "6 Clicks" Mobility  Outcome Measure Help needed turning from your back to your side while in a flat bed without using bedrails?: A Little Help needed moving from lying on your back to sitting on the side of a flat bed without using bedrails?: A Little Help needed moving to and from a bed to a chair (including a wheelchair)?: A Lot Help needed standing up from a chair using your arms (e.g., wheelchair or bedside chair)?: A Lot Help needed to walk in hospital room?: Total Help needed climbing 3-5 steps with a railing? : Total 6 Click Score: 12    End of Session   Activity Tolerance: Other (comment) (Treatment limited by cognition level and response to education/cuing.  Also limited by nausea.) Patient left: in bed;with nursing/sitter in room;with call bell/phone within reach Nurse Communication: Mobility status PT Visit Diagnosis: Unsteadiness on feet (R26.81);Other abnormalities of gait and mobility (R26.89);Muscle weakness (generalized) (M62.81);Difficulty in walking, not elsewhere classified (R26.2);Dizziness and giddiness (R42)    Time: 2130-8657  PT Time Calculation (min) (ACUTE ONLY): 17 min   Charges:   PT Evaluation $PT Eval Moderate Complexity: 1 Mod   PT General Charges $$ ACUTE PT VISIT: 1 Visit         Rozanna Corner, PT, DPT Physical Therapist - [facility]  06/30/23, 1:23 PM

## 2023-06-30 NOTE — ED Notes (Signed)
 Pt has returned from CT.

## 2023-06-30 NOTE — Progress Notes (Signed)
  Echocardiogram 2D Echocardiogram has been performed.  Meredith Martinez 06/30/2023, 4:25 PM

## 2023-06-30 NOTE — ED Notes (Signed)
 Warm blanket placed on patient. Patient woke briefly when blood pressure was taken and moved all extremities.

## 2023-06-30 NOTE — ED Notes (Signed)
 Patient remains asleep. Patient is able to change position independently. Due to medication given, will defer NIH scale until patient is more alert.

## 2023-06-30 NOTE — ED Notes (Signed)
 ED tech Daphne Eagles  at bedside. Patient became agitated, stated need to use bed pan. Patient took off all leads, gown and linens.

## 2023-06-30 NOTE — Progress Notes (Signed)
 SLP Cancellation Note  Patient Details Name: Meredith Martinez MRN: [id number] DOB: 06-17-1950   Cancelled treatment:       Reason Eval/Treat Not Completed: Patient not medically ready. Per chart review- pt failed swallow screen secondary to level of consciousness. Bedside swallow order added. RN reported agitation this AM (aggression noted overnight requiring ativan). SLP will hold eval at this time. SLP will continue to follow pending pt's appropriateness/readiness for evaluation. MD and RN aware of plan.  [place] Jamiya Nims Clapp, MS, CCC-SLP Speech Language Pathologist Rehab Services; [facility] [phone] (ascom)     [place] J Clapp 06/30/2023, 8:52 AM

## 2023-06-30 NOTE — Evaluation (Addendum)
 Occupational Therapy Evaluation Patient Details Name: Meredith Martinez MRN: [id number] DOB: 08-01-1950 Today's Date: 06/30/2023   History of Present Illness   Pt  is a 72 y.o. female who presents with AMS and headache. 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 PMH of HTN, HLD, DM, GERD, gastroparesis, diabetic neuropathy, chronic pain syndrome.     Clinical Impressions Pt was seen for OT evaluation this date. Pt is unable to provide any history this date. In person interpreter Norita Beauvais assisted throughout session. Pt seemingly agreed that she was IND prior to admission and using a SPC, however unable to follow 1 step commands consistently and mostly kept eyes closed during session.  Pt presents to acute OT demonstrating impaired ADL performance and functional mobility 2/2 weakness, pain, and impaired cognition. PT/OT co-evaluation performed for safety as pt has been agitated. She is ripping gown off in bed, noted to have soiled linens from urine and bowel incontinence. Min A x2 to safely reach EOB with max multi-modal cueing needed. Min/Mod A x2 for STS from high gurney with HHA x2 with limited standing tolerance and reliance to lean posteriorly on EOB. Able to stand while full linens changed, peri-care provided and brief change performed all with total assist. Returned to bed and scooted to [facility] with Max A x2. Pt will benefit from acute OT services to address noted impairments and functional limitations to maximize safety and independence while minimizing falls risk and caregiver burden. Do anticipate the need for follow up OT services upon acute hospital DC.      If plan is discharge home, recommend the following:   A lot of help with bathing/dressing/bathroom;A lot of help with walking and/or transfers;Assistance with cooking/housework;Direct supervision/assist for financial management;Supervision due to cognitive status;Assist for  transportation;Direct supervision/assist for medications management;Help with stairs or ramp for entrance     Functional Status Assessment   Patient has had a recent decline in their functional status and demonstrates the ability to make significant improvements in function in a reasonable and predictable amount of time.     Equipment Recommendations   Other (comment) (defer to next venue)     Recommendations for Other Services         Precautions/Restrictions   Precautions Precautions: Fall Recall of Precautions/Restrictions: Impaired Restrictions Weight Bearing Restrictions Per Provider Order: No     Mobility Bed Mobility Overal bed mobility: Needs Assistance Bed Mobility: Supine to Sit, Sit to Supine     Supine to sit: Min assist, +2 for safety/equipment, HOB elevated Sit to supine: Min assist, +2 for safety/equipment   General bed mobility comments: via HHA and max cueing, assist for trunk management; assist to return safely to supine    Transfers Overall transfer level: Needs assistance Equipment used: 2 person hand held assist Transfers: Sit to/from Stand Sit to Stand: Min assist, Mod assist, From elevated surface           General transfer comment: Min/Mod A with max cueing for STS from high ED stretcher; pt leaning on stretcher for support with inability to stand for extended periods of time; required full linen change d/t BM and urine incont      Balance Overall balance assessment: Needs assistance Sitting-balance support: Bilateral upper extremity supported Sitting balance-Leahy Scale: Poor     Standing balance support: Bilateral upper extremity supported Standing balance-Leahy Scale: Poor  ADL either performed or assessed with clinical judgement   ADL Overall ADL's : Needs assistance/impaired                     Lower Body Dressing: Total assistance;Sit to/from stand;Sitting/lateral  leans Lower Body Dressing Details (indicate cue type and reason): to don socks and doff/don brief     Toileting- Clothing Manipulation and Hygiene: Total assistance;Sit to/from stand Toileting - Clothing Manipulation Details (indicate cue type and reason): following BM standing at bedside             Vision         Perception         Praxis         Pertinent Vitals/Pain Pain Assessment Pain Assessment: Faces Faces Pain Scale: Hurts even more Pain Location: abdomen Pain Descriptors / Indicators: Grimacing, Guarding Pain Intervention(s): Monitored during session, Repositioned, Limited activity within patient's tolerance     Extremity/Trunk Assessment Upper Extremity Assessment Upper Extremity Assessment: Generalized weakness;RUE deficits/detail RUE Deficits / Details: R hand appeared slightly fisted-unsure if this is new or her baseline   Lower Extremity Assessment Lower Extremity Assessment: Generalized weakness       Communication Communication Communication: Impaired (utilized Systems developer in person interpreter for session)   Cognition Arousal: Obtunded Behavior During Therapy: Agitated, Impulsive Cognition: Cognition impaired             OT - Cognition Comments: states her name and that she is in the hospital but otherwise unable to answer questions                 Following commands: Impaired Following commands impaired: Follows one step commands inconsistently     Cueing  General Comments   Cueing Techniques: Verbal cues;Gestural cues;Tactile cues  pt vomited x1 during session, keeps eyes closed most of session; unable to follow commands consistently   Exercises     Shoulder Instructions      Home Living Family/patient expects to be discharged to:: Unsure                                 Additional Comments: in person interpreter used [place]; pt seemingly says yes she is IND using a cane; however very hard to tell d/t poor  cognition      Prior Functioning/Environment Prior Level of Function : Patient poor historian/Family not available                    OT Problem List: Decreased strength;Impaired balance (sitting and/or standing);Decreased safety awareness;Decreased cognition;Decreased activity tolerance   OT Treatment/Interventions: Self-care/ADL training;Balance training;Therapeutic exercise;Therapeutic activities;Patient/family education;DME and/or AE instruction      OT Goals(Current goals can be found in the care plan section)   Acute Rehab OT Goals OT Goal Formulation: Patient unable to participate in goal setting Time For Goal Achievement: 07/14/23 Potential to Achieve Goals: Fair ADL Goals Pt Will Perform Grooming: with contact guard assist;sitting Pt Will Perform Lower Body Bathing: with min assist;sitting/lateral leans;sit to/from stand Pt Will Perform Lower Body Dressing: with min assist;sit to/from stand;sitting/lateral leans Pt Will Transfer to Toilet: with min assist;regular height toilet;ambulating;bedside commode;stand pivot transfer   OT Frequency:  Min 2X/week    Co-evaluation PT/OT/SLP Co-Evaluation/Treatment: Yes Reason for Co-Treatment: Complexity of the patient's impairments (multi-system involvement);Necessary to address cognition/behavior during functional activity;For patient/therapist safety;To address functional/ADL transfers PT goals addressed during session: Mobility/safety with mobility;Balance;Proper use  of DME OT goals addressed during session: ADL's and self-care;Proper use of Adaptive equipment and DME      AM-PAC OT "6 Clicks" Daily Activity     Outcome Measure Help from another person eating meals?: A Little Help from another person taking care of personal grooming?: A Little Help from another person toileting, which includes using toliet, bedpan, or urinal?: Total Help from another person bathing (including washing, rinsing, drying)?: Total Help  from another person to put on and taking off regular upper body clothing?: A Lot Help from another person to put on and taking off regular lower body clothing?: Total 6 Click Score: 11   End of Session Nurse Communication: Mobility status  Activity Tolerance: Treatment limited secondary to agitation;Treatment limited secondary to medical complications (Comment) (dizzy, stomach pain, vomiting) Patient left: in bed;with call bell/phone within reach;with bed alarm set;with nursing/sitter in room  OT Visit Diagnosis: Other abnormalities of gait and mobility (R26.89);Unsteadiness on feet (R26.81);Muscle weakness (generalized) (M62.81)                Time: 1023-1040 OT Time Calculation (min): 17 min Charges:  OT General Charges $OT Visit: 1 Visit OT Evaluation $OT Eval Moderate Complexity: 1 Mod Parker Wherley, OTR/L 06/30/23, 1:26 PM  Anamari Galeas E Hien Perreira 06/30/2023, 1:14 PM

## 2023-06-30 NOTE — ED Notes (Addendum)
 This RN took over care of Pt. Bed alarm turned on, feet propped up. Will defer NIHSS until Pt is more a;ert d/t acute confusion/agitation

## 2023-06-30 NOTE — ED Notes (Signed)
 Meredith Martinez, [phone] video interpreter in use.

## 2023-06-30 NOTE — Inpatient Diabetes Management (Signed)
 Inpatient Diabetes Program Recommendations  AACE/ADA: New Consensus Statement on Inpatient Glycemic Control (2015)  Target Ranges:  Prepandial:   less than 140 mg/dL      Peak postprandial:   less than 180 mg/dL (1-2 hours)      Critically ill patients:  140 - 180 mg/dL    Latest Reference Range & Units 06/29/23 17:32  Hemoglobin A1C 4.8 - 5.6 % 14.0 (H)  355 mg/dl  (H): Data is abnormally high  Latest Reference Range & Units 06/29/23 17:32  Glucose 70 - 99 mg/dL 960 (H)  (H): Data is abnormally high  Latest Reference Range & Units 06/29/23 17:29 06/29/23 23:12 06/30/23 08:25  Glucose-Capillary 70 - 99 mg/dL 454 (H) 098 (H)  15 units Novolog   10 units Semglee 251 (H)  5 units Novolog    (H): Data is abnormally high    Admit with: Stroke  History: DM, HTN  Home DM Meds: Glipizide 20 mg daily        Jardiance 25 mg daily        Metformin 1000 mg daily  Current Orders: Novolog  Moderate Correction Scale/ SSI (0-15 units) TID AC + HS     Semglee 10 units daily   PCP: [facility]   MD- Note Semglee started last PM.  CBG 251 this AM.  May consider increasing the [facility] further to 15 units at bedtime (0.25 units/kg)  Since pt remains NPO this AM, may want to Increase the frequency of the Novolog  SSi to Q4 hours  Will need insulin  for home given her Current A1c of 14%    --Will follow patient during hospitalization--  Langston Pippins RN, MSN, CDCES Diabetes Coordinator Inpatient Glycemic Control Team Team Pager: [phone] (8a-5p)

## 2023-06-30 NOTE — ED Notes (Signed)
 Patient is sleeping on right side. No family at bedside.

## 2023-06-30 NOTE — ED Notes (Signed)
 This tech and PT cleaned pt up after BM and urinary incontinence. Pt's linen, chux pad, and brief were changed.

## 2023-06-30 NOTE — Progress Notes (Signed)
  PROGRESS NOTE    Meredith Martinez  ZOX:[id number] DOB: [birth year] DOA: 06/29/2023 [facility]CP: [facility]  128A/128A-AA  LOS: 0 days   Brief hospital course:   Assessment & Plan: Ysela Geelan is a 73 y.o. female with medical history significant of HTN, HLD, DM, GERD, gastroparesis, diabetic neuropathy, chronic pain syndrome, who presents with altered mental status and headache.    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  Echo showed no atrial level shunt.  Consulted Dr. Doretta Gant of neurology - ASA 81mg  daily + plavix 75mg  daily x21 days f/b ASA 81mg  daily monotherapy after that  --cont tele --Lipitor 80 --PT/OT   Acute metabolic encephalopathy:  Due to stroke.  Patient is confused and combative.  Failed bedside swallow eval --SLP when able   Type 2 diabetes mellitus with peripheral neuropathy (HCC):  Hyperglycemia Recent A1c 12.5, poorly controlled.  Patient has glipizide, Jardiance and metformin on med list.  --cont glargine 10u daily - Sliding scale insulin    Hypertension associated with type 2 diabetes mellitus (HCC) --hold BP meds for permissive HTN -prn IV hydralazine for SBP>220 or dBP>120   Hyperlipidemia associated with type 2 diabetes mellitus (HCC) -Lipitor   Diabetic neuropathy (HCC) -Neurontin   Nausea vomiting  --likely due to stroke, causing vertigo --supportive care --d/c Reglan    Chronic pain syndrome - Neurontin - As needed morphine  for severe pain    DVT prophylaxis: Lovenox  SQ Code Status: Full code  Family Communication:  Level of care: Med-Surg Dispo:   The patient is from: home Anticipated d/c is to: to be determined Anticipated d/c date is: > 3 days   Subjective and Interval History:  Pt was alternating between sleeping and being agitated and vomiting.   Objective: Vitals:   06/30/23 0800 06/30/23 0928 06/30/23 1000 06/30/23  1434  BP: (!) 158/58  (!) 149/49 119/69  Pulse: 83  84 80  Resp: (!) 22  (!) 21 18  Temp:  98.7 F (37.1 C)  99 F (37.2 C)  TempSrc:  Axillary    SpO2: 99%  100% 100%  Weight:      Height:       No intake or output data in the 24 hours ending 06/30/23 1944 Filed Weights   06/29/23 1821  Weight: 60.7 kg    Examination:   Constitutional: NAD, arousable, kept eyes closed CV: No cyanosis.   RESP: normal respiratory effort, on RA Extremities: No effusions, edema in BLE SKIN: warm, dry   Data Reviewed: I have personally reviewed labs and imaging studies  Time spent: 50 minutes  Garrison Kanner, MD [facility] If 7PM-7AM, please contact night-coverage 06/30/2023, 7:44 PM

## 2023-06-30 NOTE — Care Management Obs Status (Signed)
 MEDICARE OBSERVATION STATUS NOTIFICATION   Patient Details  Name: Meredith Martinez MRN: [id number] Date of Birth: 1950-10-20   Medicare Observation Status Notification Given:  Rudolph Cost, CMA 06/30/2023, 11:35 AM

## 2023-06-30 NOTE — ED Notes (Signed)
 Vent Bigeminy noted on cardiac monitor. CV strip saved to pt chart. RN notified provider of findings. Pt is back in NSR at this time.

## 2023-07-01 ENCOUNTER — Other Ambulatory Visit (HOSPITAL_COMMUNITY): Payer: Self-pay

## 2023-07-01 ENCOUNTER — Telehealth (HOSPITAL_COMMUNITY): Payer: Self-pay | Admitting: Pharmacy Technician

## 2023-07-01 DIAGNOSIS — I639 Cerebral infarction, unspecified: Secondary | ICD-10-CM | POA: Diagnosis not present

## 2023-07-01 LAB — GLUCOSE, CAPILLARY
Glucose-Capillary: 195 mg/dL — ABNORMAL HIGH (ref 70–99)
Glucose-Capillary: 272 mg/dL — ABNORMAL HIGH (ref 70–99)
Glucose-Capillary: 109 mg/dL — ABNORMAL HIGH (ref 70–99)
Glucose-Capillary: 187 mg/dL — ABNORMAL HIGH (ref 70–99)

## 2023-07-01 LAB — AMMONIA: Ammonia: <13 umol/L (ref 9–35)

## 2023-07-01 LAB — VITAMIN B12: Vitamin B-12: 201 pg/mL (ref 180–914)

## 2023-07-01 LAB — HIV ANTIBODY (ROUTINE TESTING W REFLEX): HIV Screen 4th Generation wRfx: NONREACTIVE

## 2023-07-01 LAB — TSH: TSH: 1.059 u[IU]/mL (ref 0.350–4.500)

## 2023-07-01 MED ORDER — ACETAMINOPHEN 500 MG PO TABS
1000.0000 mg | ORAL_TABLET | Freq: Once | ORAL | Status: AC
  Administered 2023-07-01: 1000 mg via ORAL
  Filled 2023-07-01: qty 2

## 2023-07-01 MED ORDER — ENSURE ENLIVE PO LIQD
237.0000 mL | Freq: Two times a day (BID) | ORAL | Status: DC
  Administered 2023-07-01 – 2023-07-05 (×4): 237 mL via ORAL

## 2023-07-01 NOTE — Inpatient Diabetes Management (Addendum)
 Inpatient Diabetes Program Recommendations  AACE/ADA: New Consensus Statement on Inpatient Glycemic Control (2015)  Target Ranges:  Prepandial:   less than 140 mg/dL      Peak postprandial:   less than 180 mg/dL (1-2 hours)      Critically ill patients:  140 - 180 mg/dL    Latest Reference Range & Units 06/29/23 17:32  Hemoglobin A1C 4.8 - 5.6 % 14.0 (H)  (H): Data is abnormally high  Latest Reference Range & Units 06/30/23 08:25 06/30/23 11:59 06/30/23 17:01 06/30/23 20:01  Glucose-Capillary 70 - 99 mg/dL 161 (H) 096 (H) 045 (H) 116 (H)  (H): Data is abnormally high  Latest Reference Range & Units 07/01/23 07:28 07/01/23 11:49  Glucose-Capillary 70 - 99 mg/dL 409 (H) 811 (H)  (H): Data is abnormally high    Admit with: Stroke   History: DM, HTN   Home DM Meds: Glipizide 20 mg daily                              Jardiance 25 mg daily                              Metformin 1000 mg daily   Current Orders: Novolog  Moderate Correction Scale/ SSI (0-15 units) TID AC + HS                           Semglee  10 units daily     PCP: [facility]     MD- Please consider:  1. Increase the Semglee  further to 12 units at bedtime   2. Start Novolog  3 units TID with meals for meal coverage HOLD if pt NPO HOLD if pt eats <50% meals  3. Will need insulin  for home given her Current A1c of 14%    Addendum 2pm--Met w/ pt at bedside.  Did not need a Nurse, learning disability. She speaks [place] well. I tried to talk with her about her A1c of 14% and how she might need insulin  for home. She was easily distracted and I needed to re-direct her attention often. She was still mildly confused but was more appropriate than the last 2 days.  I tried to show her the insulin  pen and she did NOT do well--Needed a lot of prompting and wouldn't listen to my directions--I am not sure she can handle insulin  independently at this point--perhaps if her mentation continues to improve she will be able to do it.  Pt  lives alone.  Not sure if family could come by her home and help her with insulin ??    --Will follow patient during hospitalization--  Langston Pippins RN, MSN, CDCES Diabetes Coordinator Inpatient Glycemic Control Team Team Pager: [phone] (8a-5p)

## 2023-07-01 NOTE — Progress Notes (Signed)
  PROGRESS NOTE    Meredith Martinez  RUE:[id number] DOB: 01-12-1951 DOA: 06/29/2023 [facility]CP: [facility]  128A/128A-AA  LOS: 1 day   Brief hospital course:   Assessment & Plan: Meredith Martinez is a 73 y.o. female with medical history significant of HTN, HLD, DM, GERD, gastroparesis, diabetic neuropathy, chronic pain syndrome, who presents with altered mental status and headache.    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  Echo showed no atrial level shunt.  Consulted Dr. Doretta Gant of neurology - ASA 81mg  daily + plavix  75mg  daily x21 days f/b ASA 81mg  daily monotherapy after that  --cont tele --Lipitor 80 --PT/OT   Acute metabolic encephalopathy:  Due to stroke.  Patient was confused and combative.   --mental status improved and back to baseline today   Type 2 diabetes mellitus with peripheral neuropathy (HCC):  Hyperglycemia Recent A1c 12.5, poorly controlled.  Patient has glipizide, Jardiance and metformin on med list.  --cont glargine 10u daily --ACHS and SSI   Hypertension associated with type 2 diabetes mellitus (HCC) --hold BP meds for permissive HTN -prn IV hydralazine  for SBP>220 or dBP>120   Hyperlipidemia associated with type 2 diabetes mellitus (HCC) -Lipitor   Diabetic neuropathy (HCC) --cont gabapentin    Nausea vomiting  --likely due to stroke, causing vertigo --supportive care    DVT prophylaxis: Lovenox  SQ Code Status: Full code  Family Communication:  Level of care: Med-Surg Dispo:   The patient is from: home Anticipated d/c is to: SNF rehab Anticipated d/c date is: whenever bed available   Subjective and Interval History:  Mental status much improved today.  Pt was alert, oriented, speaking English, coherent, asked to eat her grilled cheese sandwich.  Pt said she didn't remember what happened.   Complained of headache above and around her  right eye.   Objective: Vitals:   07/01/23 0358 07/01/23 0731 07/01/23 1151 07/01/23 1549  BP: (!) 166/68 (!) 157/62 (!) 146/67 (!) 108/46  Pulse: 73 70 71 68  Resp: 18  16 18   Temp: 98.8 F (37.1 C) 98.3 F (36.8 C) 98.6 F (37 C) 98.7 F (37.1 C)  TempSrc: Oral   Oral  SpO2: 100% 100% 94% 96%  Weight:      Height:        Intake/Output Summary (Last 24 hours) at 07/01/2023 1838 Last data filed at 07/01/2023 0400 Gross per 24 hour  Intake 627.49 ml  Output --  Net 627.49 ml   Filed Weights   06/29/23 1821  Weight: 60.7 kg    Examination:   Constitutional: NAD, AAOx3 HEENT: conjunctivae and lids normal, EOMI CV: No cyanosis.   RESP: normal respiratory effort, on RA Neuro: II - XII grossly intact.   Psych: Normal mood and affect.  Appropriate judgement and reason    Data Reviewed: I have personally reviewed labs and imaging studies  Time spent: 35 minutes  Garrison Kanner, MD [facility] If 7PM-7AM, please contact night-coverage 07/01/2023, 6:38 PM

## 2023-07-01 NOTE — Telephone Encounter (Signed)
 Patient Product/process development scientist completed.    The patient is insured through [facility]. Patient has Medicare and is not eligible for a copay card, but may be able to apply for patient assistance or Medicare RX Payment Plan (Patient Must reach out to their plan, if eligible for payment plan), if available.    Ran test claim for Lantus  Pen and the current 30 day co-pay is $0.00.   This test claim was processed through [facility]- copay amounts may vary at other pharmacies due to pharmacy/plan contracts, or as the patient moves through the different stages of their insurance plan.     Morgan Arab, CPHT Pharmacy Technician III Certified Patient Advocate [facility] Patient Advocate Team Direct Number: [phone]  Fax: [phone]

## 2023-07-01 NOTE — Plan of Care (Signed)
  Problem: Ischemic Stroke/TIA Tissue Perfusion: Goal: Complications of ischemic stroke/TIA will be minimized Outcome: Progressing   Problem: Education: Goal: Knowledge of disease or condition will improve Outcome: Progressing Goal: Knowledge of secondary prevention will improve (HTN, HLD, T2DM) Outcome: Progressing   Problem: Coping: Goal: Will verbalize positive feelings about self Outcome: Progressing Goal: Will identify appropriate support needs Outcome: Progressing   Problem: Health Behavior/Discharge Planning: Goal: Ability to manage health-related needs will improve Outcome: Progressing Goal: Goals will be collaboratively established with patient/family Outcome: Progressing   Problem: Self-Care: Goal: Ability to participate in self-care as condition permits will improve Outcome: Progressing Goal: Verbalization of feelings and concerns over difficulty with self-care will improve Outcome: Progressing Goal: Ability to communicate needs accurately will improve Outcome: Progressing   Problem: Nutrition: Goal: Risk of aspiration will decrease Outcome: Progressing Goal: Dietary intake will improve Outcome: Progressing

## 2023-07-01 NOTE — Progress Notes (Signed)
 Pt irritable and refusing midnight vitals.

## 2023-07-01 NOTE — Plan of Care (Signed)

## 2023-07-02 DIAGNOSIS — I639 Cerebral infarction, unspecified: Secondary | ICD-10-CM | POA: Diagnosis not present

## 2023-07-02 LAB — RPR: RPR Ser Ql: NONREACTIVE

## 2023-07-02 LAB — GLUCOSE, CAPILLARY
Glucose-Capillary: 203 mg/dL — ABNORMAL HIGH (ref 70–99)
Glucose-Capillary: 164 mg/dL — ABNORMAL HIGH (ref 70–99)
Glucose-Capillary: 107 mg/dL — ABNORMAL HIGH (ref 70–99)
Glucose-Capillary: 227 mg/dL — ABNORMAL HIGH (ref 70–99)

## 2023-07-02 MED ORDER — VITAMIN B-12 1000 MCG PO TABS
1000.0000 ug | ORAL_TABLET | Freq: Every day | ORAL | Status: DC
  Administered 2023-07-02 – 2023-07-06 (×5): 1000 ug via ORAL
  Filled 2023-07-02 (×5): qty 1

## 2023-07-02 MED ORDER — BUTALBITAL-APAP-CAFFEINE 50-325-40 MG PO TABS
2.0000 | ORAL_TABLET | Freq: Once | ORAL | Status: AC
  Administered 2023-07-02: 2 via ORAL
  Filled 2023-07-02: qty 2

## 2023-07-02 NOTE — Plan of Care (Signed)

## 2023-07-02 NOTE — Progress Notes (Signed)
 Mobility Specialist - Progress Note      07/02/23 1700  Mobility  Activity Ambulated with assistance to bathroom;Stood at bedside;Dangled on edge of bed  Level of Assistance Standby assist, set-up cues, supervision of patient - no hands on  Assistive Device Front wheel walker  Distance Ambulated (ft) 15 ft  Range of Motion/Exercises Active  Activity Response Tolerated well  Mobility Referral Yes  Mobility visit 1 Mobility   Pt resting in bed on RA upon entry. Pt STS and ambulates to bathroom SBA with RW. Pt returned bed and left with needs in reach. Pt performed hygiene clean at bedside. Pt bed alarm activated.

## 2023-07-02 NOTE — Progress Notes (Signed)
  PROGRESS NOTE    Meredith Martinez  OZH:[id number] DOB: 01-29-1951 DOA: 06/29/2023 PCP: [facility]  128A/128A-AA  LOS: 2 days   Brief hospital course:   Assessment & Plan: Meredith Martinez is a 73 y.o. female with medical history significant of HTN, HLD, DM, GERD, gastroparesis, diabetic neuropathy, chronic pain syndrome, who presents with altered mental status and headache.    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  Echo showed no atrial level shunt.  Consulted Dr. Doretta Gant of neurology - ASA 81mg  daily + plavix  75mg  daily x21 days f/b ASA 81mg  daily monotherapy after that  --cont tele --Lipitor 80 --PT/OT rec SNF rehab   Acute metabolic encephalopathy:  Due to stroke.  Patient was confused and combative.   --mental status improved and back to baseline on 5/9   Type 2 diabetes mellitus with peripheral neuropathy [facility]):  Hyperglycemia Recent A1c 12.5, poorly controlled.  Patient has glipizide, Jardiance and metformin on med list.  --cont glargine 10u daily --ACHS and SSI   Hypertension associated with type 2 diabetes mellitus (HCC) --hold BP meds for now    Hyperlipidemia associated with type 2 diabetes mellitus (HCC) -Lipitor   Diabetic neuropathy (HCC) --cont gabapentin    Nausea vomiting  --likely due to stroke, causing vertigo --supportive care  Headache --tylenol  or Fioricet PRN    DVT prophylaxis: Lovenox  SQ Code Status: Full code  Family Communication:  Level of care: Med-Surg Dispo:   The patient is from: home Anticipated d/c is to: SNF rehab Anticipated d/c date is: whenever bed available   Subjective and Interval History:  Pt still had headache, but improved from yesterday.   Objective: Vitals:   07/02/23 0427 07/02/23 0748 07/02/23 1145 07/02/23 1526  BP: (!) 105/52 116/61 (!) 142/61 114/72  Pulse: 63 67 65 66  Resp: 16 18 14 16    Temp: 98.1 F (36.7 C) 98.1 F (36.7 C) 98.4 F (36.9 C) 98.4 F (36.9 C)  TempSrc: Oral   Oral  SpO2: 99% 99% 100% 98%  Weight:      Height:        Intake/Output Summary (Last 24 hours) at 07/02/2023 1637 Last data filed at 07/02/2023 1429 Gross per 24 hour  Intake 240 ml  Output --  Net 240 ml   Filed Weights   06/29/23 1821  Weight: 60.7 kg    Examination:   Constitutional: NAD, AAOx3 HEENT: conjunctivae and lids normal, EOMI CV: No cyanosis.   RESP: normal respiratory effort, on RA Neuro: II - XII grossly intact.   Psych: Normal mood and affect.  Appropriate judgement and reason   Data Reviewed: I have personally reviewed labs and imaging studies  Time spent: 35 minutes  Garrison Kanner, MD [facility] If 7PM-7AM, please contact night-coverage 07/02/2023, 4:37 PM

## 2023-07-02 NOTE — Progress Notes (Signed)
 Physical Therapy Treatment Patient Details Name: Meredith Martinez MRN: [id number] DOB: 21-Jan-1951 Today's Date: 07/02/2023   History of Present Illness Pt  is a 73 y.o. female who presents with AMS and headache. 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 PMH of HTN, HLD, DM, GERD, gastroparesis, diabetic neuropathy, chronic pain syndrome.    PT Comments  Pt was supine in bed upon arrival. Untouched breakfast tray at bedside." I can't eat that." Pt is A and O x 3. Agreeable to session and seems very aware of current situation. She demonstrated much improved abilities to exit bed, stand, and tolerate ambulation with +1 UE support. Pt does  not usually use AD at baseline but currently is very unsteady with +1 UE support only. Pt was able to tolerate ambulation ~ 50 ft but endorses blurred vision and requested to return to room to prevent worsening nausea. Author encouraged pt to eat breakfast. Overall pt is progressing and tolerated session well. DC recs remain appropriate to maximize her independence and safety with all ADLs    If plan is discharge home, recommend the following: A little help with walking and/or transfers;A little help with bathing/dressing/bathroom;Assistance with cooking/housework;Direct supervision/assist for financial management;Direct supervision/assist for medications management;Assist for transportation;Help with stairs or ramp for entrance;Supervision due to cognitive status     Equipment Recommendations  Other (comment) (Defer to next level of care)       Precautions / Restrictions Precautions Precautions: Fall Recall of Precautions/Restrictions: Impaired Restrictions Weight Bearing Restrictions Per Provider Order: No     Mobility  Bed Mobility Overal bed mobility: Needs Assistance Bed Mobility: Supine to Sit, Sit to Supine  Supine to sit: Contact guard Sit to supine: Contact guard assist   Transfers Overall  transfer level: Needs assistance Equipment used: 1 person hand held assist Transfers: Sit to/from Stand Sit to Stand: Contact guard assist, Min assist  General transfer comment: pt still having unsteadiness at times with transfers due to vision/ balance deficits    Ambulation/Gait Ambulation/Gait assistance: Min assist Gait Distance (Feet): 50 Feet Assistive device: 1 person hand held assist Gait Pattern/deviations: Step-through pattern, Staggering left, Staggering right Gait velocity: decreased  General Gait Details: Pt ambulated ~ 50 ft without AD + 1 UE support. pt does not use a AD at baseline however currently at high risk of falls due to vision deficits + balance deficits. encouraged pt to use AD with RN staff   Balance Overall balance assessment: Needs assistance Sitting-balance support: Single extremity supported Sitting balance-Leahy Scale: Fair     Standing balance support: Single extremity supported, During functional activity Standing balance-Leahy Scale: Poor Standing balance comment: pt has balance deficits which continue to make her at high risk of falls      Communication Communication Communication: No apparent difficulties  Cognition Arousal: Alert Behavior During Therapy: WFL for tasks assessed/performed, Impulsive   PT - Cognitive impairments: No family/caregiver present to determine baseline      PT - Cognition Comments: Pt is A and O x 4 however slightly impulsive with poor overall safety awareness Following commands: Intact      Cueing Cueing Techniques: Verbal cues         Pertinent Vitals/Pain Pain Assessment Pain Assessment: No/denies pain     PT Goals (current goals can now be found in the care plan section) Acute Rehab PT Goals Patient Stated Goal: rehab then home Progress towards PT goals: Progressing toward goals    Frequency  Min 2X/week       Co-evaluation     PT goals addressed during session: Mobility/safety with  mobility;Balance;Strengthening/ROM        AM-PAC PT "6 Clicks" Mobility   Outcome Measure  Help needed turning from your back to your side while in a flat bed without using bedrails?: A Little Help needed moving from lying on your back to sitting on the side of a flat bed without using bedrails?: A Little Help needed moving to and from a bed to a chair (including a wheelchair)?: A Little Help needed standing up from a chair using your arms (e.g., wheelchair or bedside chair)?: A Little Help needed to walk in hospital room?: A Little Help needed climbing 3-5 steps with a railing? : A Lot 6 Click Score: 17    End of Session   Activity Tolerance: Patient tolerated treatment well Patient left: in bed;with call bell/phone within reach;with bed alarm set Nurse Communication: Mobility status PT Visit Diagnosis: Unsteadiness on feet (R26.81);Other abnormalities of gait and mobility (R26.89);Muscle weakness (generalized) (M62.81);Difficulty in walking, not elsewhere classified (R26.2);Dizziness and giddiness (R42)     Time: 1610-9604 PT Time Calculation (min) (ACUTE ONLY): 13 min  Charges:    $Gait Training: 8-22 mins PT General Charges $$ ACUTE PT VISIT: 1 Visit                    Chester Costa PTA 07/02/23, 9:09 AM

## 2023-07-02 NOTE — Evaluation (Signed)
 Clinical/Bedside Swallow Evaluation Patient Details  Name: Markie Laluz MRN: [id number] Date of Birth: 1950-10-08  Today's Date: 07/02/2023 Time: SLP Start Time (ACUTE ONLY): 0920 SLP Stop Time (ACUTE ONLY): 0930 SLP Time Calculation (min) (ACUTE ONLY): 10 min  Past Medical History:  Past Medical History:  Diagnosis Date   Diabetes mellitus without complication (HCC)    niddm   Past Surgical History:  Past Surgical History:  Procedure Laterality Date   BREAST BIOPSY Right    neg   ESOPHAGOGASTRODUODENOSCOPY (EGD) WITH PROPOFOL  N/A 06/27/2017   Procedure: ESOPHAGOGASTRODUODENOSCOPY (EGD) WITH PROPOFOL ;  Surgeon: Luke Salaam, MD;  Location: [facility] ENDOSCOPY;  Service: Gastroenterology;  Laterality: N/A;   HPI:  Pt  is a 73 y.o. female who presents with AMS and headache. 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 PMH of HTN, HLD, DM, GERD, gastroparesis, diabetic neuropathy, chronic pain syndrome.    Assessment / Plan / Recommendation  Clinical Impression  Pt seen for bedside swallow assessment in the setting of CVA. Eval order initially placed secondary to pt failing swallow screen, pt is now more alert and on a Dyx 3 (mech soft diet) and thin liquids. No oral motor impairment noted- pt on room air. Pt reporting minimal intake since admission related to appetite and food options. Pt seen with trials of regular solids and thin liquids. No overt or subtle s/sx pharyngeal dysphagia noted. No change to vocal quality across trials. Oral phase mildly prolonged, though suspect that is baseline related to lack of dentition- pt demonstrating excellent awareness for compensatory strategies and food selection to meet limitation with oral phase.   Recommend advancement to regular solids with pt selection of soft/manageable foods. Mildly increased risk for aspiration in the setting of recent neuro injury and baseline GERD. General aspiration  precautions, slow rate, small bites, elevated HOB, and alert for PO intake, apply. No additional dysphagia intervention indicated. See speech language evaluation for details of SLP POC.   SLP Visit Diagnosis: Dysphagia, unspecified (R13.10);Cognitive communication deficit (R41.841)    Aspiration Risk  Mild aspiration risk    Diet Recommendation   Thin;Age appropriate regular (cut meats)  Medication Administration: Whole meds with liquid    Other  Recommendations Oral Care Recommendations: Oral care BID    Recommendations for follow up therapy are one component of a multi-disciplinary discharge planning process, led by the attending physician.  Recommendations may be updated based on patient status, additional functional criteria and insurance authorization.  Follow up Recommendations Follow physician's recommendations for discharge plan and follow up therapies      Assistance Recommended at Discharge Ind with PO intake  Functional Status Assessment Patient has not had a recent decline in their functional status (in regards to dysphagia)    Swallow Study   General Date of Onset: 07/02/23 HPI: Pt  is a 73 y.o. female who presents with AMS and headache. 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 PMH of HTN, HLD, DM, GERD, gastroparesis, diabetic neuropathy, chronic pain syndrome. Type of Study: Bedside Swallow Evaluation Previous Swallow Assessment: none in chart Diet Prior to this Study: Dysphagia 3 (mechanical soft);Thin liquids (Level 0) Temperature Spikes Noted: No Respiratory Status: Room air History of Recent Intubation: No Behavior/Cognition: Alert;Pleasant mood;Cooperative Oral Cavity Assessment: Within Functional Limits Oral Care Completed by SLP: Recent completion by staff Oral Cavity - Dentition: Edentulous Vision: Functional for self-feeding Self-Feeding Abilities: Able to feed self;Needs set up Patient Positioning:  Other (comment) (edge of bed) Baseline Vocal Quality: Normal Volitional Cough: Strong Volitional Swallow: Able to elicit    Oral/Motor/Sensory Function Overall Oral Motor/Sensory Function: Within functional limits   Ice Chips Ice chips: Not tested   Thin Liquid Thin Liquid: Within functional limits Presentation: Straw    Nectar Thick Nectar Thick Liquid: Not tested   Honey Thick Honey Thick Liquid: Not tested   Puree Puree: Not tested   Solid     Solid: Within functional limits Presentation: Self Fed Other Comments: min extra time for oral clearance     [place] Lindee Leason Clapp, MS, CCC-SLP Speech Language Pathologist Rehab Services; [facility] [phone] (ascom)   [place] J Clapp 07/02/2023,3:11 PM

## 2023-07-02 NOTE — Plan of Care (Signed)
  Problem: Ischemic Stroke/TIA Tissue Perfusion: Goal: Complications of ischemic stroke/TIA will be minimized 07/02/2023 0657 by Adah Hollering, RN Outcome: Progressing  Problem: Education: Goal: Knowledge of disease or condition will improve 07/02/2023 0657 by Adah Hollering, RN Outcome: Progressing  Goal: Knowledge of secondary prevention will improve  07/02/2023 0657 by Adah Hollering, RN Outcome: Progressing Goal: Knowledge of patient specific risk factors will improve 07/02/2023 0657 by Adah Hollering, RN Outcome: Progressing  Problem: Coping: Goal: Will verbalize positive feelings about self 07/02/2023 0657 by Adah Hollering, RN Outcome: Progressing Goal: Will identify appropriate support needs 07/02/2023 0657 by Adah Hollering, RN Outcome: Progressing  Problem: Health Behavior/Discharge Planning: Goal: Ability to manage health-related needs will improve 07/02/2023 0657 by Adah Hollering, RN Outcome: Progressing Goal: Goals will be collaboratively established with patient/family 07/02/2023 0657 by Adah Hollering, RN Outcome: Progressing  Problem: Self-Care: Goal: Ability to participate in self-care as condition permits will improve 07/02/2023 0657 by Adah Hollering, RN Outcome: Progressing 07/02/2023 0514 by Adah Hollering, RN Outcome: Progressing Goal: Verbalization of feelings and concerns over difficulty with self-care will improve 07/02/2023 0657 by Adah Hollering, RN Outcome: Progressing Goal: Ability to communicate needs accurately will improve 07/02/2023 0657 by Adah Hollering, RN Outcome: Progressing  Problem: Nutrition: Goal: Risk of aspiration will decrease 07/02/2023 0657 by Adah Hollering, RN Outcome: Progressing Goal: Dietary intake will improve 07/02/2023 0657 by Adah Hollering, RN Outcome: Progressing

## 2023-07-02 NOTE — Evaluation (Addendum)
 Speech Language Pathology Evaluation Patient Details Name: Meredith Martinez MRN: [id number] DOB: 22-Jan-1951 Today's Date: 07/02/2023 Time: 8295-6213 SLP Time Calculation (min) (ACUTE ONLY): 25 min  Problem List:  Patient Active Problem List   Diagnosis Date Noted   AMS (altered mental status) 06/30/2023   Stroke (HCC) 06/29/2023   Type 2 diabetes mellitus with peripheral neuropathy (HCC) 06/29/2023   Acute metabolic encephalopathy 06/29/2023   Nausea & vomiting 06/29/2023   Abnormal MRI, lumbar spine (12/24/2021) 01/20/2022   Lumbar facet arthropathy 01/20/2022   Vitamin D  deficiency 12/09/2021   Elevated sed rate 12/09/2021   Elevated hemoglobin A1c 12/09/2021   Abnormal MRI, cervical spine (10/28/2021) 12/08/2021   DDD (degenerative disc disease), cervical 10/19/2021   Osteoporosis 10/19/2021   Osteoarthritis of hip (Right) 10/19/2021   Contracture of joint of finger of hand (Right) 10/19/2021   Atrophy of muscle of hand (Right) 10/19/2021   Wrist fracture, sequela (Right) 10/19/2021   Diabetic peripheral neuropathy (HCC) (1ry area of Pain) 10/19/2021   Chronic upper extremity pain (4th area of Pain) (Right) 10/19/2021   Chronic hand pain (5th area of Pain) (Bilateral) 10/19/2021   Pharmacologic therapy 10/18/2021   Disorder of skeletal system 10/18/2021   Problems influencing health status 10/18/2021   Neuropathic pain of hand, right 05/05/2018   Diabetic polyneuropathy associated with type 2 diabetes mellitus (HCC) 04/02/2018   Chronic radicular cervical pain 03/16/2018   Diabetic neuropathy (HCC) 03/16/2018   Type 2 diabetes mellitus with hyperglycemia, with long-term current use of insulin  (HCC) 10/27/2017   Hypertension associated with type 2 diabetes mellitus (HCC) 10/27/2017   Hyperlipidemia associated with type 2 diabetes mellitus (HCC) 10/27/2017   Arthralgia 10/06/2017   Mixed hyperlipidemia 10/06/2017   Gastroparesis 06/25/2017   Chronic lower extremity pain (1ry  area of Pain) (Bilateral) (R>L) 09/30/2016   Chronic pain syndrome 09/30/2016   Chronic neck pain (3ry area of Pain) (Bilateral) (L>R) 09/30/2016   Long term current use of opiate analgesic 09/30/2016   Long term prescription opiate use 09/30/2016   Opiate use 09/30/2016   Chronic low back pain (2ry area of Pain) (Bilateral) (L>R) w/o sciatica 09/30/2016   Neurogenic pain 09/30/2016   Sacroiliac joint pain 09/30/2016   Past Medical History:  Past Medical History:  Diagnosis Date   Diabetes mellitus without complication (HCC)    niddm   Past Surgical History:  Past Surgical History:  Procedure Laterality Date   BREAST BIOPSY Right    neg   ESOPHAGOGASTRODUODENOSCOPY (EGD) WITH PROPOFOL  N/A 06/27/2017   Procedure: ESOPHAGOGASTRODUODENOSCOPY (EGD) WITH PROPOFOL ;  Surgeon: Luke Salaam, MD;  Location: [facility] ENDOSCOPY;  Service: Gastroenterology;  Laterality: N/A;   HPI:  Meredith Martinez  is a 73 y.o. female who presents with AMS and headache. 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 PMH of HTN, HLD, DM, GERD, gastroparesis, diabetic neuropathy, chronic pain syndrome.   Assessment / Plan / Recommendation Clinical Impression  Meredith Martinez seen for cognitive linguistic evaluation in the setting of CVA (MRI revealing multiple acute R cerebellar infarcts and acute or subacute left thalamic infarct). Assessment consisting of Meredith Martinez interview and completion of the CLQT clock drawing subtest. Meredith Martinez presents with grossly mild cognitive communication deficit, c/b reduced short term recall, orientation, problem solving and executive function. Meredith Martinez expressed concern for visual changes (blurry) and limited recall related to hospitalization and events leading up to hospital stay. Education shared regarding brain's difficulty to store information during times of neuro injury. Verbal cues (binary options  and semantic cues) and repetition bolstered cognitive processing (specifically short  term recall, orientation, and safety reasoning). Independent expressive/receptive language demonstrated during assessment. Motor speech grossly intact. Meredith Martinez with emerging awareness for current deficits. Therefore, recommend continued assessment to determine approximation to baseline and follow up recommendations. Meredith Martinez in agreement with stated plan.      SLP Assessment  SLP Recommendation/Assessment: Patient needs continued Speech Lanaguage Pathology Services SLP Visit Diagnosis: Dysphagia, unspecified (R13.10);Cognitive communication deficit (R41.841)    Recommendations for follow up therapy are one component of a multi-disciplinary discharge planning process, led by the attending physician.  Recommendations may be updated based on patient status, additional functional criteria and insurance authorization.    Follow Up Recommendations  Follow physician's recommendations for discharge plan and follow up therapies    Assistance Recommended at Discharge  Frequent or constant Supervision/Assistance in regards to higher level tasks (medication and financial management)   Functional Status Assessment Patient has had a recent decline in their functional status and demonstrates the ability to make significant improvements in function in a reasonable and predictable amount of time.   Frequency and Duration min 2x/week  2 weeks      SLP Evaluation Cognition  Overall Cognitive Status: Impaired/Different from baseline Arousal/Alertness: Awake/alert Orientation Level: Oriented to person;Oriented to place;Disoriented to time;Disoriented to situation Attention: Sustained Sustained Attention: Appears intact Memory: Impaired Memory Impairment: Decreased short term memory;Decreased recall of new information Decreased Short Term Memory: Verbal basic (recall of phone number recently stated) Awareness: Appears intact Problem Solving: Impaired Problem Solving Impairment: Verbal basic Executive Function:  Organizing;Sequencing;Self Monitoring;Self Correcting Sequencing: Impaired Sequencing Impairment: Verbal basic Organizing: Impaired Organizing Impairment: Verbal basic Self Monitoring: Impaired Self Monitoring Impairment: Verbal basic Self Correcting: Impaired Self Correcting Impairment: Verbal basic Behaviors:  (none) Safety/Judgment: Appears intact (with min verbal cues for use of call bell and current physical limitations)       Comprehension  Auditory Comprehension Overall Auditory Comprehension: Appears within functional limits for tasks assessed Visual Recognition/Discrimination Discrimination: Exceptions to [facility] Reading Comprehension Reading Status: Not tested    Expression Expression Primary Mode of Expression: Verbal Verbal Expression Overall Verbal Expression: Appears within functional limits for tasks assessed Written Expression Dominant Hand: Right Written Expression: Within Functional Limits   Oral / Motor  Oral Motor/Sensory Function Overall Oral Motor/Sensory Function: Within functional limits Motor Speech Overall Motor Speech: Appears within functional limits for tasks assessed           [place] Luanna Weesner Clapp, MS, CCC-SLP Speech Language Pathologist Rehab Services; [facility] [phone] (ascom)   [place] J Clapp 07/02/2023, 3:19 PM

## 2023-07-03 DIAGNOSIS — I639 Cerebral infarction, unspecified: Secondary | ICD-10-CM | POA: Diagnosis not present

## 2023-07-03 LAB — URINALYSIS, COMPLETE (UACMP) WITH MICROSCOPIC
Bilirubin Urine: NEGATIVE
Glucose, UA: >=500 mg/dL — AB
Hgb urine dipstick: NEGATIVE
Ketones, ur: NEGATIVE mg/dL
Nitrite: NEGATIVE
Protein, ur: 100 mg/dL — AB
Specific Gravity, Urine: 1.022 (ref 1.005–1.030)
WBC, UA: >50 WBC/hpf (ref 0–5)
pH: 6 (ref 5.0–8.0)

## 2023-07-03 LAB — URINE DRUG SCREEN, QUALITATIVE (ARMC ONLY)
Amphetamines, Ur Screen: NOT DETECTED
Barbiturates, Ur Screen: POSITIVE — AB
Benzodiazepine, Ur Scrn: NOT DETECTED
Cannabinoid 50 Ng, Ur ~~LOC~~: NOT DETECTED
Cocaine Metabolite,Ur ~~LOC~~: NOT DETECTED
MDMA (Ecstasy)Ur Screen: NOT DETECTED
Methadone Scn, Ur: NOT DETECTED
Opiate, Ur Screen: NOT DETECTED
Phencyclidine (PCP) Ur S: NOT DETECTED
Tricyclic, Ur Screen: NOT DETECTED

## 2023-07-03 LAB — GLUCOSE, CAPILLARY
Glucose-Capillary: 235 mg/dL — ABNORMAL HIGH (ref 70–99)
Glucose-Capillary: 244 mg/dL — ABNORMAL HIGH (ref 70–99)
Glucose-Capillary: 231 mg/dL — ABNORMAL HIGH (ref 70–99)
Glucose-Capillary: 151 mg/dL — ABNORMAL HIGH (ref 70–99)
Glucose-Capillary: 179 mg/dL — ABNORMAL HIGH (ref 70–99)

## 2023-07-03 MED ORDER — LISINOPRIL 10 MG PO TABS
10.0000 mg | ORAL_TABLET | Freq: Every day | ORAL | Status: DC
  Administered 2023-07-03 – 2023-07-06 (×4): 10 mg via ORAL
  Filled 2023-07-03 (×4): qty 1

## 2023-07-03 MED ORDER — INSULIN GLARGINE-YFGN 100 UNIT/ML ~~LOC~~ SOLN
12.0000 [IU] | Freq: Every day | SUBCUTANEOUS | Status: DC
  Administered 2023-07-04: 12 [IU] via SUBCUTANEOUS
  Filled 2023-07-03: qty 0.12

## 2023-07-03 NOTE — Progress Notes (Signed)
  PROGRESS NOTE    Meredith Martinez  WJX:[id number] DOB: July 23, 1950 DOA: 06/29/2023 PCP: [facility]  109A/109A-AA  LOS: 3 days   Brief hospital course:   Assessment & Plan: Meredith Martinez is a 73 y.o. female with medical history significant of HTN, HLD, DM, GERD, gastroparesis, diabetic neuropathy, chronic pain syndrome, who presents with altered mental status and headache.    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  Echo showed no atrial level shunt.  Consulted Dr. Doretta Gant of neurology - ASA 81mg  daily + plavix  75mg  daily x21 days f/b ASA 81mg  daily monotherapy after that  --cont tele --Lipitor 80 --PT/OT rec SNF rehab   Acute metabolic encephalopathy:  Due to stroke.  Patient was confused and combative.   --mental status improved and back to baseline on 5/9   Type 2 diabetes mellitus with peripheral neuropathy [facility]):  Hyperglycemia Recent A1c 12.5, poorly controlled.  Patient has glipizide, Jardiance and metformin on med list.  --increase glargine to 12u daily --ACHS and SSI   Hypertension associated with type 2 diabetes mellitus (HCC) --resume home Lisinopril   Hyperlipidemia associated with type 2 diabetes mellitus (HCC) -Lipitor   Diabetic neuropathy (HCC) --cont gabapentin    Nausea vomiting, resolved --likely due to stroke, causing vertigo --supportive care  Headache --tylenol  or Fioricet PRN    DVT prophylaxis: Lovenox  SQ Code Status: Full code  Family Communication:  Level of care: Med-Surg Dispo:   The patient is from: home Anticipated d/c is to: SNF rehab Anticipated d/c date is: whenever bed available   Subjective and Interval History:  Pt reported HA returned.   Objective: Vitals:   07/03/23 0427 07/03/23 0830 07/03/23 1154 07/03/23 1648  BP: (!) 126/52 (!) 122/52 (!) 124/92 (!) 154/70  Pulse: 63 65 74 68  Resp: 17 18 18 18    Temp: 98.3 F (36.8 C) 98.6 F (37 C) 98.3 F (36.8 C) 98 F (36.7 C)  TempSrc: Oral Oral Oral Oral  SpO2: 100% 100% 100% 100%  Weight:      Height:        Intake/Output Summary (Last 24 hours) at 07/03/2023 1837 Last data filed at 07/03/2023 1500 Gross per 24 hour  Intake 720 ml  Output --  Net 720 ml   Filed Weights   06/29/23 1821  Weight: 60.7 kg    Examination:   Constitutional: NAD, alert, oriented to person and place HEENT: conjunctivae and lids normal, EOMI CV: No cyanosis.   RESP: normal respiratory effort, on RA Neuro: II - XII grossly intact.     Data Reviewed: I have personally reviewed labs and imaging studies  Time spent: 35 minutes  Garrison Kanner, MD [facility] If 7PM-7AM, please contact night-coverage 07/03/2023, 6:37 PM

## 2023-07-03 NOTE — Plan of Care (Signed)

## 2023-07-03 NOTE — Care Management Important Message (Signed)
 Important Message  Patient Details  Name: Ilani Prestridge MRN: [id number] Date of Birth: 1950-03-20   Important Message Given:  Yes - Medicare IM     Anise Kerns 07/03/2023, 2:02 PM

## 2023-07-04 DIAGNOSIS — I639 Cerebral infarction, unspecified: Secondary | ICD-10-CM | POA: Diagnosis not present

## 2023-07-04 LAB — GLUCOSE, CAPILLARY
Glucose-Capillary: 274 mg/dL — ABNORMAL HIGH (ref 70–99)
Glucose-Capillary: 319 mg/dL — ABNORMAL HIGH (ref 70–99)
Glucose-Capillary: 215 mg/dL — ABNORMAL HIGH (ref 70–99)
Glucose-Capillary: 127 mg/dL — ABNORMAL HIGH (ref 70–99)

## 2023-07-04 MED ORDER — PROCHLORPERAZINE EDISYLATE 10 MG/2ML IJ SOLN
5.0000 mg | Freq: Every day | INTRAMUSCULAR | Status: DC | PRN
  Administered 2023-07-05: 5 mg via INTRAVENOUS
  Filled 2023-07-04 (×2): qty 1

## 2023-07-04 MED ORDER — DIPHENHYDRAMINE HCL 25 MG PO CAPS
25.0000 mg | ORAL_CAPSULE | Freq: Every day | ORAL | Status: DC | PRN
  Administered 2023-07-05: 25 mg via ORAL
  Filled 2023-07-04: qty 1

## 2023-07-04 MED ORDER — INSULIN ASPART 100 UNIT/ML IJ SOLN
3.0000 [IU] | Freq: Three times a day (TID) | INTRAMUSCULAR | Status: DC
  Administered 2023-07-05 – 2023-07-06 (×5): 3 [IU] via SUBCUTANEOUS
  Filled 2023-07-04 (×4): qty 1

## 2023-07-04 MED ORDER — INSULIN GLARGINE-YFGN 100 UNIT/ML ~~LOC~~ SOLN
15.0000 [IU] | Freq: Every day | SUBCUTANEOUS | Status: DC
  Administered 2023-07-05: 15 [IU] via SUBCUTANEOUS
  Filled 2023-07-04: qty 0.15

## 2023-07-04 NOTE — Inpatient Diabetes Management (Addendum)
 Inpatient Diabetes Program Recommendations  AACE/ADA: New Consensus Statement on Inpatient Glycemic Control   Target Ranges:  Prepandial:   less than 140 mg/dL      Peak postprandial:   less than 180 mg/dL (1-2 hours)      Critically ill patients:  140 - 180 mg/dL    Latest Reference Range & Units 07/03/23 07:51 07/03/23 12:08 07/03/23 15:46 07/03/23 21:11 07/04/23 08:13  Glucose-Capillary 70 - 99 mg/dL 191 (H) 478 (H) 295 (H) 179 (H) 274 (H)    Latest Reference Range & Units 06/25/17 08:33 10/19/21 12:41 06/29/23 17:32  Hemoglobin A1C 4.8 - 5.6 % 12.2 (H) 12.5 (H) 14.0 (H)   Review of Glycemic Control  Diabetes history: DM2 Outpatient Diabetes medications: Glipizide 20 mg daily, Jardiance 25 mg daily, Metformin 1000 mg daily Current orders for Inpatient glycemic control: Semglee  12 units daily, Novolog  0-15 units TID with meals, Novolog  0-5 units at bedtime  Inpatient Diabetes Program Recommendations:    Insulin : Noted Semglee  increased to 12 units daily yesterday evening; will get increased dose today. May want to consider ordering Novolog  3 units TID with meals for meal coverage if patient eats at least 50% of meals.   HbgA1C:  A1C 14% on 06/29/23.  Outpatient DM: At discharge, would benefit from simplified DM medication regimen. Therefore, may want to use once a day basal insulin  along with oral DM medications outpatient.  If patient is discharged on insulin , please provide Rx for Lantus  Solostar pens [phone]), insulin  pen needles [phone]), and glucose monitoring kit (#[id number]).   Addendum 07/04/23@12 :15-Spoke to patient and family at bedside regarding DM control and insulin . Patient states that she took insulin  in the past. However, in talking with patient more she reports she used a pen that already had a needle and was a time use pen; question if it was a GLP-1 medication. Patient admits that she frequently forgets or gets too busy to take her medications. Patient reports that she  has a glucometer somewhere at home but not sure where or how old it is.   Discussed A1C results (14.0% on 06/29/23) and explained that current A1C indicates an average glucose of 355 mg/dl over the past 2-3 months. Discussed glucose and A1C goals. Discussed importance of checking CBGs and maintaining good CBG control to prevent long-term and short-term complications. Explained how hyperglycemia leads to damage within blood vessels which lead to the common complications seen with uncontrolled diabetes. Stressed to the patient the importance of improving glycemic control to prevent further complications from uncontrolled diabetes. Discussed impact of nutrition, exercise, stress, sickness, and medications on diabetes control.  Patient states she is willing to take insulin  at discharge if needed and she will do her best to be consistent with taking it. Educated patient and family on insulin  pen use at home. Reviewed all steps of insulin  pen including attachment of needle, 2-unit air shot, dialing up dose, giving injection, removing needle, disposal of sharps, storage of unused insulin , disposal of insulin  etc. Patient able to provide successful return demonstration with patient's family giving her verb cues about next steps. Patient will need to continue to practice with insulin  injections and family will need to help at home initially to ensure patient is able to use the insulin  pen properly. Asked patient to be sure to follow up with PCP regarding DM management.  Patient verbalized understanding of information discussed and reports no further questions at this time related to diabetes.  Thanks, Beacher Limerick, RN, MSN, CDCES Diabetes Coordinator  Inpatient Diabetes Program [phone] (Team Pager from 8am to 5pm)

## 2023-07-04 NOTE — Progress Notes (Signed)
 Speech Language Pathology Treatment: Cognitive-Linquistic  Patient Details Name: Meredith Martinez MRN: [id number] DOB: [birth year] Today's Date: 07/04/2023 Time: 0910-0940 SLP Time Calculation (min) (ACUTE ONLY): 30 min  Assessment / Plan / Recommendation Clinical Impression  Pt seen for follow up cognitive communication intervention targeting further assessment and pt education. Mini Mental State examination completed, with pt scoring 25/30, falling WNL for assessment. Min verbal cues aiding orientation, recall, and problem solving to correct errors. Pt demonstrating use of visual aids and self talk to aid processing and recall. Pt independently able to identify tasks to be completed for financial management. Currently pt with greatest concern for "floating specs" and "glare" in regards to vision. Education shared regarding results of assessment and recommendations for at least initial assessment at next level of care for higher level cognitive processing in the setting of independent functioning at baseline. No further acute SLP services indicated.   HPI HPI: Pt  is a 73 y.o. female who presents with AMS and headache. 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 PMH of HTN, HLD, DM, GERD, gastroparesis, diabetic neuropathy, chronic pain syndrome.      SLP Plan  All goals met (recommend follow up at next level of care)      Recommendations for follow up therapy are one component of a multi-disciplinary discharge planning process, led by the attending physician.  Recommendations may be updated based on patient status, additional functional criteria and insurance authorization.    Recommendations                     Oral care BID   Intermittent Supervision/Assistance Cognitive communication deficit (R41.841)     All goals met (recommend follow up at next level of care)    Meredith Aairah Negrette Clapp, MS, CCC-SLP Speech Language  Pathologist Rehab Services; [facility] [phone] (ascom)   Meredith J Martinez  07/04/2023, 10:45 AM

## 2023-07-04 NOTE — Progress Notes (Signed)
 Occupational Therapy Treatment Patient Details Name: Meredith Martinez MRN: [id number] DOB: 17-Aug-1950 Today's Date: 07/04/2023   History of present illness Pt  is a 73 y.o. female who presents with AMS and headache. 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 PMH of HTN, HLD, DM, GERD, gastroparesis, diabetic neuropathy, chronic pain syndrome.   OT comments  Pt seen for OT treatment on this date. Upon arrival to room pt sitting up in bed with family at bedside, agreeable to tx. Pt requires MODI for bed mobility with use of bed rails. Pt STS from EOB with RW+supervision. Amb into BR with RW+CGA, progressing to supervision when using the RW. Completed pericare sit/stand with CGA. Pt amb to the recliner with good safety awareness and slow steady gait, no noted LOB. Pt making good progress toward goals, will continue to follow POC. Discharge recommendation remains appropriate.        If plan is discharge home, recommend the following:  A lot of help with bathing/dressing/bathroom;A lot of help with walking and/or transfers;Assistance with cooking/housework;Direct supervision/assist for financial management;Supervision due to cognitive status;Assist for transportation;Direct supervision/assist for medications management;Help with stairs or ramp for entrance   Equipment Recommendations  Other (comment)    Recommendations for Other Services      Precautions / Restrictions Precautions Precautions: Fall Recall of Precautions/Restrictions: Impaired Restrictions Weight Bearing Restrictions Per Provider Order: No       Mobility Bed Mobility Overal bed mobility: Needs Assistance Bed Mobility: Supine to Sit     Supine to sit: Modified independent (Device/Increase time)          Transfers Overall transfer level: Needs assistance Equipment used: Rolling walker (2 wheels) Transfers: Sit to/from Stand, Bed to chair/wheelchair/BSC Sit to  Stand: Supervision           General transfer comment: Noted improvements with overall stabilty when using RW     Balance Overall balance assessment: Needs assistance Sitting-balance support: Single extremity supported Sitting balance-Leahy Scale: Good Sitting balance - Comments: Good reaching within BOS   Standing balance support: During functional activity, Single extremity supported Standing balance-Leahy Scale: Fair Standing balance comment: Stood with no UE support during static standing, increased safety awareness when standing                           ADL either performed or assessed with clinical judgement   ADL Overall ADL's : Needs assistance/impaired     Grooming: Brushing hair;Sitting                   Toilet Transfer: Contact guard assist;Rolling walker (2 wheels)   Toileting- Clothing Manipulation and Hygiene: CGA       Functional mobility during ADLs: Contact guard assist;Rolling walker (2 wheels) General ADL Comments: CGA progressing to supervision with amb with RW, and completing ADL tasks.    Extremity/Trunk Assessment              Vision       Restaurant manager, fast food Communication: No apparent difficulties   Cognition Arousal: Alert Behavior During Therapy: WFL for tasks assessed/performed, Impulsive               OT - Cognition Comments: A/Ox4                 Following commands: Intact        Cueing  Cueing Techniques: Verbal cues  Exercises Exercises: Other exercises Other Exercises Other Exercises: Edu: Benefits of RW use within home, routine modifications with checking blood sugars    Shoulder Instructions       General Comments Family at beside    Pertinent Vitals/ Pain       Pain Assessment Pain Assessment: No/denies pain  Home Living                                          Prior Functioning/Environment               Frequency  Min 2X/week        Progress Toward Goals  OT Goals(current goals can now be found in the care plan section)  Progress towards OT goals: Progressing toward goals  Acute Rehab OT Goals OT Goal Formulation: Patient unable to participate in goal setting Time For Goal Achievement: 07/14/23 Potential to Achieve Goals: Good ADL Goals Pt Will Perform Grooming: with contact guard assist;sitting Pt Will Perform Lower Body Bathing: with min assist;sitting/lateral leans;sit to/from stand Pt Will Perform Lower Body Dressing: with min assist;sit to/from stand;sitting/lateral leans Pt Will Transfer to Toilet: with min assist;regular height toilet;ambulating;bedside commode;stand pivot transfer  Plan      Co-evaluation                 AM-PAC OT "6 Clicks" Daily Activity     Outcome Measure   Help from another person eating meals?: None Help from another person taking care of personal grooming?: A Little Help from another person toileting, which includes using toliet, bedpan, or urinal?: A Little Help from another person bathing (including washing, rinsing, drying)?: A Little Help from another person to put on and taking off regular upper body clothing?: None Help from another person to put on and taking off regular lower body clothing?: A Little 6 Click Score: 20    End of Session Equipment Utilized During Treatment: Gait belt;Rolling walker (2 wheels)  OT Visit Diagnosis: Other abnormalities of gait and mobility (R26.89);Unsteadiness on feet (R26.81);Muscle weakness (generalized) (M62.81)   Activity Tolerance Patient tolerated treatment well   Patient Left in chair;with call bell/phone within reach;with chair alarm set;with family/visitor present   Nurse Communication Mobility status        Time: 6578-4696 OT Time Calculation (min): 28 min  Charges: OT General Charges $OT Visit: 1 Visit OT Treatments $Self Care/Home Management : 8-22 mins $Therapeutic  Activity: 8-22 mins  Rosaria Common M.S. OTR/L  07/04/23, 1:59 PM

## 2023-07-04 NOTE — Plan of Care (Signed)
 Problem: Education: Goal: Ability to describe self-care measures that may prevent or decrease complications (Diabetes Survival Skills Education) will improve 07/04/2023 2249 by Lupe Salk D, LPN Outcome: Progressing 07/04/2023 2201 by Lupe Salk D, LPN Outcome: Progressing Goal: Individualized Educational Video(s) 07/04/2023 2249 by Lupe Salk D, LPN Outcome: Progressing 07/04/2023 2201 by Lupe Salk D, LPN Outcome: Progressing   Problem: Coping: Goal: Ability to adjust to condition or change in health will improve 07/04/2023 2249 by Lupe Salk D, LPN Outcome: Progressing 07/04/2023 2201 by Lupe Salk D, LPN Outcome: Progressing   Problem: Fluid Volume: Goal: Ability to maintain a balanced intake and output will improve 07/04/2023 2249 by Lupe Salk D, LPN Outcome: Progressing 07/04/2023 2201 by Lupe Salk D, LPN Outcome: Progressing   Problem: Health Behavior/Discharge Planning: Goal: Ability to identify and utilize available resources and services will improve 07/04/2023 2249 by Lupe Salk D, LPN Outcome: Progressing 07/04/2023 2201 by Lupe Salk D, LPN Outcome: Progressing Goal: Ability to manage health-related needs will improve 07/04/2023 2249 by Lupe Salk D, LPN Outcome: Progressing 07/04/2023 2201 by Lupe Salk D, LPN Outcome: Progressing   Problem: Metabolic: Goal: Ability to maintain appropriate glucose levels will improve 07/04/2023 2249 by Lupe Salk D, LPN Outcome: Progressing 07/04/2023 2201 by Lupe Salk D, LPN Outcome: Progressing   Problem: Nutritional: Goal: Maintenance of adequate nutrition will improve 07/04/2023 2249 by Lupe Salk D, LPN Outcome: Progressing 07/04/2023 2201 by Lupe Salk D, LPN Outcome: Progressing Goal: Progress toward achieving an optimal weight will improve 07/04/2023 2249 by Lupe Salk D, LPN Outcome: Progressing 07/04/2023 2201 by Lupe Salk D,  LPN Outcome: Progressing   Problem: Skin Integrity: Goal: Risk for impaired skin integrity will decrease 07/04/2023 2249 by Lupe Salk D, LPN Outcome: Progressing 07/04/2023 2201 by Lupe Salk D, LPN Outcome: Progressing   Problem: Tissue Perfusion: Goal: Adequacy of tissue perfusion will improve 07/04/2023 2249 by Lupe Salk D, LPN Outcome: Progressing 07/04/2023 2201 by Lupe Salk D, LPN Outcome: Progressing   Problem: Education: Goal: Knowledge of disease or condition will improve 07/04/2023 2249 by Lupe Salk D, LPN Outcome: Progressing 07/04/2023 2201 by Lupe Salk D, LPN Outcome: Progressing Goal: Knowledge of secondary prevention will improve (MUST DOCUMENT ALL) 07/04/2023 2249 by Lupe Salk D, LPN Outcome: Progressing 07/04/2023 2201 by Lupe Salk D, LPN Outcome: Progressing Goal: Knowledge of patient specific risk factors will improve (DELETE if not current risk factor) 07/04/2023 2249 by Lupe Salk D, LPN Outcome: Progressing 07/04/2023 2201 by Lupe Salk D, LPN Outcome: Progressing   Problem: Ischemic Stroke/TIA Tissue Perfusion: Goal: Complications of ischemic stroke/TIA will be minimized 07/04/2023 2249 by Lupe Salk D, LPN Outcome: Progressing 07/04/2023 2201 by Lupe Salk D, LPN Outcome: Progressing   Problem: Coping: Goal: Will verbalize positive feelings about self 07/04/2023 2249 by Lupe Salk D, LPN Outcome: Progressing 07/04/2023 2201 by Lupe Salk D, LPN Outcome: Progressing Goal: Will identify appropriate support needs 07/04/2023 2249 by Lupe Salk D, LPN Outcome: Progressing 07/04/2023 2201 by Lupe Salk D, LPN Outcome: Progressing   Problem: Health Behavior/Discharge Planning: Goal: Ability to manage health-related needs will improve 07/04/2023 2249 by Lupe Salk D, LPN Outcome: Progressing 07/04/2023 2201 by Lupe Salk D, LPN Outcome: Progressing Goal: Goals will  be collaboratively established with patient/family 07/04/2023 2249 by Lupe Salk D, LPN Outcome: Progressing 07/04/2023 2201 by Lupe Salk D, LPN Outcome: Progressing   Problem: Self-Care: Goal: Ability to participate in self-care as condition permits will improve 07/04/2023 2249 by Lupe Salk D, LPN Outcome: Progressing 07/04/2023  2201 by Lupe Salk D, LPN Outcome: Progressing Goal: Verbalization of feelings and concerns over difficulty with self-care will improve 07/04/2023 2249 by Lupe Salk D, LPN Outcome: Progressing 07/04/2023 2201 by Lupe Salk D, LPN Outcome: Progressing Goal: Ability to communicate needs accurately will improve 07/04/2023 2249 by Lupe Salk D, LPN Outcome: Progressing 07/04/2023 2201 by Lupe Salk D, LPN Outcome: Progressing   Problem: Nutrition: Goal: Risk of aspiration will decrease 07/04/2023 2249 by Lupe Salk D, LPN Outcome: Progressing 07/04/2023 2201 by Lupe Salk D, LPN Outcome: Progressing Goal: Dietary intake will improve 07/04/2023 2249 by Lupe Salk D, LPN Outcome: Progressing 07/04/2023 2201 by Lupe Salk D, LPN Outcome: Progressing   Problem: Education: Goal: Knowledge of General Education information will improve Description: Including pain rating scale, medication(s)/side effects and non-pharmacologic comfort measures 07/04/2023 2249 by Lupe Salk D, LPN Outcome: Progressing 07/04/2023 2201 by Lupe Salk D, LPN Outcome: Progressing   Problem: Health Behavior/Discharge Planning: Goal: Ability to manage health-related needs will improve 07/04/2023 2249 by Lupe Salk D, LPN Outcome: Progressing 07/04/2023 2201 by Lupe Salk D, LPN Outcome: Progressing   Problem: Clinical Measurements: Goal: Ability to maintain clinical measurements within normal limits will improve 07/04/2023 2249 by Lupe Salk D, LPN Outcome: Progressing 07/04/2023 2201 by Lupe Salk D,  LPN Outcome: Progressing Goal: Will remain free from infection 07/04/2023 2249 by Lupe Salk D, LPN Outcome: Progressing 07/04/2023 2201 by Lupe Salk D, LPN Outcome: Progressing Goal: Diagnostic test results will improve 07/04/2023 2249 by Lupe Salk D, LPN Outcome: Progressing 07/04/2023 2201 by Lupe Salk D, LPN Outcome: Progressing Goal: Respiratory complications will improve 07/04/2023 2249 by Lupe Salk D, LPN Outcome: Progressing 07/04/2023 2201 by Lupe Salk D, LPN Outcome: Progressing Goal: Cardiovascular complication will be avoided 07/04/2023 2249 by Lupe Salk D, LPN Outcome: Progressing 07/04/2023 2201 by Lupe Salk D, LPN Outcome: Progressing   Problem: Activity: Goal: Risk for activity intolerance will decrease 07/04/2023 2249 by Lupe Salk D, LPN Outcome: Progressing 07/04/2023 2201 by Lupe Salk D, LPN Outcome: Progressing   Problem: Nutrition: Goal: Adequate nutrition will be maintained 07/04/2023 2249 by Lupe Salk D, LPN Outcome: Progressing 07/04/2023 2201 by Lupe Salk D, LPN Outcome: Progressing   Problem: Coping: Goal: Level of anxiety will decrease 07/04/2023 2249 by Lupe Salk D, LPN Outcome: Progressing 07/04/2023 2201 by Lupe Salk D, LPN Outcome: Progressing   Problem: Elimination: Goal: Will not experience complications related to bowel motility 07/04/2023 2249 by Lupe Salk D, LPN Outcome: Progressing 07/04/2023 2201 by Lupe Salk D, LPN Outcome: Progressing Goal: Will not experience complications related to urinary retention 07/04/2023 2249 by Lupe Salk D, LPN Outcome: Progressing 07/04/2023 2201 by Lupe Salk D, LPN Outcome: Progressing   Problem: Pain Managment: Goal: General experience of comfort will improve and/or be controlled 07/04/2023 2249 by Lupe Salk D, LPN Outcome: Progressing 07/04/2023 2201 by Lupe Salk D, LPN Outcome:  Progressing   Problem: Safety: Goal: Ability to remain free from injury will improve 07/04/2023 2249 by Lupe Salk D, LPN Outcome: Progressing 07/04/2023 2201 by Lupe Salk D, LPN Outcome: Progressing   Problem: Skin Integrity: Goal: Risk for impaired skin integrity will decrease 07/04/2023 2249 by Lupe Salk D, LPN Outcome: Progressing 07/04/2023 2201 by Lupe Salk D, LPN Outcome: Progressing

## 2023-07-04 NOTE — Plan of Care (Signed)

## 2023-07-04 NOTE — Progress Notes (Signed)
  PROGRESS NOTE    Meredith Martinez  ZOX:[id number] DOB: 11-02-1950 DOA: 06/29/2023 PCP: [facility]  109A/109A-AA  LOS: 4 days   Brief hospital course:   Assessment & Plan: Meredith Martinez is a 73 y.o. female with medical history significant of HTN, HLD, DM, GERD, gastroparesis, diabetic neuropathy, chronic pain syndrome, who presents with altered mental status and headache.    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  Echo showed no atrial level shunt.  Consulted Dr. Doretta Gant of neurology - ASA 81mg  daily + plavix  75mg  daily x21 days f/b ASA 81mg  daily monotherapy after that  --cont tele --Lipitor 80 --PT/OT rec SNF rehab   Acute metabolic encephalopathy:  Due to stroke.  Patient was confused and combative.   --mental status improved and back to baseline on 5/9   Type 2 diabetes mellitus with peripheral neuropathy [facility]):  Hyperglycemia Recent A1c 12.5, poorly controlled.  Patient has glipizide, Jardiance and metformin on med list.  --increase glargine to 15u daily --ACHS and SSI --add mealtime 3u TID   Hypertension associated with type 2 diabetes mellitus (HCC) --resume home Lisinopril   Hyperlipidemia associated with type 2 diabetes mellitus (HCC) -Lipitor   Diabetic neuropathy (HCC) --cont gabapentin    Nausea vomiting, resolved --likely due to stroke, causing vertigo --supportive care  Headache --tylenol  PRN --migraine cocktail PRN    DVT prophylaxis: Lovenox  SQ Code Status: Full code  Family Communication:  Level of care: Med-Surg Dispo:   The patient is from: home Anticipated d/c is to: SNF rehab Anticipated d/c date is: whenever bed available   Subjective and Interval History:  Pt had headache last night that resolved after sleep.  Reported seeing "spots" which is chronic but worsened.   Objective: Vitals:   07/04/23 0457 07/04/23 0749  07/04/23 1626 07/04/23 1626  BP: (!) 112/59 (!) 141/65 (!) 117/48 (!) 117/48  Pulse: (!) 58 62 (!) 58 (!) 57  Resp: 16 16 16 16   Temp: 97.7 F (36.5 C) 98.3 F (36.8 C) 98.6 F (37 C) 98.6 F (37 C)  TempSrc:      SpO2: 100% 98% 98% 99%  Weight:      Height:        Intake/Output Summary (Last 24 hours) at 07/04/2023 1910 Last data filed at 07/04/2023 1057 Gross per 24 hour  Intake 120 ml  Output 200 ml  Net -80 ml   Filed Weights   06/29/23 1821  Weight: 60.7 kg    Examination:   Constitutional: NAD, AAOx3 HEENT: conjunctivae and lids normal, EOMI CV: No cyanosis.   RESP: normal respiratory effort, on RA Neuro: II - XII grossly intact.   Psych: Normal mood and affect.  Appropriate judgement and reason   Data Reviewed: I have personally reviewed labs and imaging studies  Time spent: 35 minutes  Garrison Kanner, MD [facility] If 7PM-7AM, please contact night-coverage 07/04/2023, 7:10 PM

## 2023-07-04 NOTE — Progress Notes (Signed)
 Physical Therapy Treatment Patient Details Name: Meredith Martinez MRN: [id number] DOB: [birth year] Today's Date: 07/04/2023   History of Present Illness Pt  is a 73 y.o. female who presents with AMS and headache. 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 PMH of HTN, HLD, DM, GERD, gastroparesis, diabetic neuropathy, chronic pain syndrome.    PT Comments  Pt received up in chair with family at bedside. Discussed baseline PTA and current LOF. Pt lives alone and currently requires MinA for balance and fall prevention while ambulating short distances without AD. Cognition appears impaired with poor safety awareness, short term memory at times, and problem solving. Blood Sugars remain high, pt noted to be on a "regular" diet, nursing and MD notified - diet changed. Pt to be d/c'd on new use of insulin  and will need to be competent on administering and safely remembering dosage. She currently remains appropriate for STR as recommended at d/c.      If plan is discharge home, recommend the following: A little help with walking and/or transfers;A little help with bathing/dressing/bathroom;Assistance with cooking/housework;Direct supervision/assist for financial management;Direct supervision/assist for medications management;Assist for transportation;Help with stairs or ramp for entrance;Supervision due to cognitive status   Can travel by private vehicle     Yes  Equipment Recommendations  Other (comment) (defer to next level of care)    Recommendations for Other Services       Precautions / Restrictions Precautions Precautions: Fall Recall of Precautions/Restrictions: Impaired Restrictions Weight Bearing Restrictions Per Provider Order: No     Mobility  Bed Mobility               General bed mobility comments: N/T in chair pre/post session    Transfers Overall transfer level: Needs assistance Equipment used: None Transfers: Sit  to/from Stand Sit to Stand: Min assist           General transfer comment: MinA to power up    Ambulation/Gait Ambulation/Gait assistance: Min assist Gait Distance (Feet): 40 Feet Assistive device: 1 person hand held assist Gait Pattern/deviations: Step-through pattern, Staggering left, Staggering right Gait velocity: decreased     General Gait Details: MinA to prevent fall, pt staggering and holding on to objects for balance, c/o dizziness and visual blurriness   Stairs             Wheelchair Mobility     Tilt Bed    Modified Rankin (Stroke Patients Only)       Balance Overall balance assessment: Needs assistance Sitting-balance support: Single extremity supported Sitting balance-Leahy Scale: Good     Standing balance support: During functional activity, Single extremity supported Standing balance-Leahy Scale: Poor Standing balance comment: MinA to safely ambulate without AD, high fall risk                            Communication Communication Communication: No apparent difficulties  Cognition Arousal: Alert Behavior During Therapy: Impulsive   PT - Cognitive impairments: Awareness, Memory, Sequencing, Problem solving, Safety/Judgement                       PT - Cognition Comments: High fall risk, poor safety awareness, decreased short term memory at times Following commands: Impaired Following commands impaired: Follows one step commands inconsistently    Cueing Cueing Techniques: Verbal cues  Exercises Other Exercises Other Exercises: Edu: Benefits of RW use within home, routine modifications with checking blood  sugars, benefits of STR    General Comments General comments (skin integrity, edema, etc.): Discussed pt's current LOF with pt and pt's family and appropriate d/c recs for STR      Pertinent Vitals/Pain Pain Assessment Pain Assessment: Faces Faces Pain Scale: Hurts a little bit Pain Location: legs, peripheral  neuropathy Pain Descriptors / Indicators: Aching, Discomfort Pain Intervention(s): Monitored during session    Home Living                          Prior Function            PT Goals (current goals can now be found in the care plan section) Acute Rehab PT Goals Patient Stated Goal: rehab then home    Frequency    Min 2X/week      PT Plan      Co-evaluation              AM-PAC PT "6 Clicks" Mobility   Outcome Measure  Help needed turning from your back to your side while in a flat bed without using bedrails?: A Little Help needed moving from lying on your back to sitting on the side of a flat bed without using bedrails?: A Little Help needed moving to and from a bed to a chair (including a wheelchair)?: A Little Help needed standing up from a chair using your arms (e.g., wheelchair or bedside chair)?: A Little Help needed to walk in hospital room?: A Lot Help needed climbing 3-5 steps with a railing? : A Lot 6 Click Score: 16    End of Session Equipment Utilized During Treatment: Gait belt Activity Tolerance: Patient tolerated treatment well Patient left: in chair;with call bell/phone within reach;with chair alarm set;with family/visitor present Nurse Communication: Mobility status;Other (comment) (Pt currently on a regular diet and is a diabetic) PT Visit Diagnosis: Unsteadiness on feet (R26.81);Other abnormalities of gait and mobility (R26.89);Muscle weakness (generalized) (M62.81);Difficulty in walking, not elsewhere classified (R26.2);Dizziness and giddiness (R42)     Time: 7829-5621 PT Time Calculation (min) (ACUTE ONLY): 24 min  Charges:    $Gait Training: 8-22 mins $Therapeutic Activity: 8-22 mins PT General Charges $$ ACUTE PT VISIT: 1 Visit                    Melvyn Stagers, PTA  Meredith Martinez 07/04/2023, 2:33 PM

## 2023-07-05 DIAGNOSIS — I639 Cerebral infarction, unspecified: Secondary | ICD-10-CM | POA: Diagnosis not present

## 2023-07-05 LAB — GLUCOSE, CAPILLARY
Glucose-Capillary: 249 mg/dL — ABNORMAL HIGH (ref 70–99)
Glucose-Capillary: 276 mg/dL — ABNORMAL HIGH (ref 70–99)
Glucose-Capillary: 102 mg/dL — ABNORMAL HIGH (ref 70–99)
Glucose-Capillary: 137 mg/dL — ABNORMAL HIGH (ref 70–99)

## 2023-07-05 MED ORDER — INSULIN GLARGINE-YFGN 100 UNIT/ML ~~LOC~~ SOLN
18.0000 [IU] | Freq: Every day | SUBCUTANEOUS | Status: DC
  Administered 2023-07-06: 18 [IU] via SUBCUTANEOUS
  Filled 2023-07-05: qty 0.18

## 2023-07-05 NOTE — TOC Initial Note (Addendum)
 Transition of Care [facility]) - Initial/Assessment Note    Patient Details  Name: Meredith Martinez MRN: [id number] Date of Birth: Jul 24, 1950  [facility] CM/SW Contact:    Crayton Docker, RN 07/05/2023, 2:28 PM  Clinical Narrative:                  CM to patient's room regarding TOC screening assessment. CM introduced case management role and discharge care planning process. Patient verbalized understanding and agreement with screening interview. Patient lives alone with 1 dog--vaccinated. Per patient only uses a shower chair.  CM and patient discussed SNF recommendations. Patient declined SNF recommendations and prefers home with home health. CM will continue to follow for discharge care planning needs.   CM call to [facility] regarding home health. Per Bartholomew Light, agency will accept patient for home health PT/OT.  CM alert to Dr. Gordy Lauber regarding patient declination of SNF recommendations and is amenable to home with home health. Per Dr. Gordy Lauber, patient will likely discharge tomorrow.  Expected Discharge Plan: Skilled Nursing Facility Barriers to Discharge: Continued Medical Work up   Patient Goals and CMS Choice      SNF--patient declined and is amenable to [facility]      Expected Discharge Plan and Services   Discharge Planning Services: CM Consult   Living arrangements for the past 2 months: Apartment                   Prior Living Arrangements/Services Living arrangements for the past 2 months: Apartment Lives with:: Pets (1  dog--vaccinated) Patient language and need for interpreter reviewed:: No Do you feel safe going back to the place where you live?: Yes      Need for Family Participation in Patient Care: Yes (Comment) Care giver support system in place?: Yes (comment) Current home services: DME (shower chair) Criminal Activity/Legal Involvement Pertinent to Current Situation/Hospitalization: No - Comment as needed  Activities of Daily Living   ADL  Screening (condition at time of admission) Independently performs ADLs?: No Does the patient have a NEW difficulty with bathing/dressing/toileting/self-feeding that is expected to last >3 days?: Yes (Initiates electronic notice to provider for possible OT consult) Does the patient have a NEW difficulty with getting in/out of bed, walking, or climbing stairs that is expected to last >3 days?: Yes (Initiates electronic notice to provider for possible PT consult) Does the patient have a NEW difficulty with communication that is expected to last >3 days?: Yes (Initiates electronic notice to provider for possible SLP consult) Is the patient deaf or have difficulty hearing?: No Does the patient have difficulty seeing, even when wearing glasses/contacts?: Yes Does the patient have difficulty concentrating, remembering, or making decisions?: Yes  Permission Sought/Granted Permission sought to share information with : Case Manager, Family Supports Permission granted to share information with : Yes, Verbal Permission Granted  Share Information with NAME: Winnifred Havers     Permission granted to share info w Relationship: Niece  Permission granted to share info w Contact Information: yes  Emotional Assessment Appearance:: Appears stated age Attitude/Demeanor/Rapport: Engaged Affect (typically observed): Calm Orientation: : Oriented to Self, Oriented to Place, Oriented to  Time, Oriented to Situation Alcohol / Substance Use: Not Applicable Psych Involvement: No (comment)  Admission diagnosis:  Stroke [facility]) [I63.9] Cerebrovascular accident (CVA), unspecified mechanism (HCC) [I63.9] AMS (altered mental status) [R41.82] Patient Active Problem List   Diagnosis Date Noted   AMS (altered mental status) 06/30/2023   Stroke (HCC) 06/29/2023  Type 2 diabetes mellitus with peripheral neuropathy (HCC) 06/29/2023   Acute metabolic encephalopathy 06/29/2023   Nausea & vomiting 06/29/2023   Abnormal MRI,  lumbar spine (12/24/2021) 01/20/2022   Lumbar facet arthropathy 01/20/2022   Vitamin D  deficiency 12/09/2021   Elevated sed rate 12/09/2021   Elevated hemoglobin A1c 12/09/2021   Abnormal MRI, cervical spine (10/28/2021) 12/08/2021   DDD (degenerative disc disease), cervical 10/19/2021   Osteoporosis 10/19/2021   Osteoarthritis of hip (Right) 10/19/2021   Contracture of joint of finger of hand (Right) 10/19/2021   Atrophy of muscle of hand (Right) 10/19/2021   Wrist fracture, sequela (Right) 10/19/2021   Diabetic peripheral neuropathy (HCC) (1ry area of Pain) 10/19/2021   Chronic upper extremity pain (4th area of Pain) (Right) 10/19/2021   Chronic hand pain (5th area of Pain) (Bilateral) 10/19/2021   Pharmacologic therapy 10/18/2021   Disorder of skeletal system 10/18/2021   Problems influencing health status 10/18/2021   Neuropathic pain of hand, right 05/05/2018   Diabetic polyneuropathy associated with type 2 diabetes mellitus (HCC) 04/02/2018   Chronic radicular cervical pain 03/16/2018   Diabetic neuropathy (HCC) 03/16/2018   Type 2 diabetes mellitus with hyperglycemia, with long-term current use of insulin  (HCC) 10/27/2017   Hypertension associated with type 2 diabetes mellitus (HCC) 10/27/2017   Hyperlipidemia associated with type 2 diabetes mellitus (HCC) 10/27/2017   Arthralgia 10/06/2017   Mixed hyperlipidemia 10/06/2017   Gastroparesis 06/25/2017   Chronic lower extremity pain (1ry area of Pain) (Bilateral) (R>L) 09/30/2016   Chronic pain syndrome 09/30/2016   Chronic neck pain (3ry area of Pain) (Bilateral) (L>R) 09/30/2016   Long term current use of opiate analgesic 09/30/2016   Long term prescription opiate use 09/30/2016   Opiate use 09/30/2016   Chronic low back pain (2ry area of Pain) (Bilateral) (L>R) w/o sciatica 09/30/2016   Neurogenic pain 09/30/2016   Sacroiliac joint pain 09/30/2016   PCP:  [facility]:   [facility] - [place], [place] - [address] [place] [address] Phone: [phone] Fax: [phone]  [id number] [place], [place] - [address] [place] [address] Phone: [phone] Fax: [phone]     Social Drivers of Health (SDOH) Social History: SDOH Screenings   Food Insecurity: No Food Insecurity (06/30/2023)  Housing: Patient Declined (06/30/2023)  Transportation Needs: Patient Declined (06/30/2023)  Utilities: Patient Declined (06/30/2023)  Social Connections: Moderately Isolated (06/30/2023)  Tobacco Use: Medium Risk (01/20/2022)   SDOH Interventions:     Readmission Risk Interventions     No data to display

## 2023-07-05 NOTE — Discharge Instructions (Signed)

## 2023-07-05 NOTE — Progress Notes (Signed)
 Mobility Specialist - Progress Note     07/05/23 1000  Mobility  Activity Ambulated with assistance in hallway;Stood at bedside  Level of Assistance Standby assist, set-up cues, supervision of patient - no hands on  Assistive Device Front wheel walker  Distance Ambulated (ft) 320 ft  Range of Motion/Exercises Active  Activity Response Tolerated well  Mobility Referral Yes  Mobility visit 1 Mobility  Mobility Specialist Start Time (ACUTE ONLY) 1000  Mobility Specialist Stop Time (ACUTE ONLY) 1021  Mobility Specialist Time Calculation (min) (ACUTE ONLY) 21 min   Pt resting in bed on RA upon entry. Pt STS and ambulates to hallway around NS for 2 laps SBA with RW. Pt returned to bed and left with needs in reach. Bed alarm activated.   Jerri Morale Mobility Specialist 07/05/23, 10:43 AM

## 2023-07-05 NOTE — Progress Notes (Signed)
 Occupational Therapy Treatment Patient Details Name: Meredith Martinez MRN: [id number] DOB: [birth year] Today's Date: 07/05/2023   History of present illness Pt  is a 73 y.o. female who presents with AMS and headache. 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 PMH of HTN, HLD, DM, GERD, gastroparesis, diabetic neuropathy, chronic pain syndrome.   OT comments  Pt seen for OT treatment on this date. Upon arrival to room pt supine in bed with HOB elevated, agreeable to tx. Pt requires SBA/supervision for bed mobility, cuing for safety with hand placement and CGA for sit to stand tranfers to RW.  Stood at sink with RW for oral care and grooming tasks with CGA/SBA with F balance with reaching to desired items.  Requested to return to bed even with education on benefits of remain OOB (in recliner chair) to improve overall conditioning. Discussion with patient on updated d/c plan which is now to SNF level care to address ongoing safety concerns, need for assistance with ADLs, medication management as patient is now requiring insulin  injections, education on dietary needs, and is currently unsafe to return home alone. Pt making good progress toward goals, will continue to follow POC. Discharge recommendation remains appropriate.  At end of session, patient in bed with [facility] elevated, call button and tray table within close reach, bed alarm on, family member and Nurse Tech in room.      If plan is discharge home, recommend the following:  A lot of help with bathing/dressing/bathroom;A lot of help with walking and/or transfers;Assistance with cooking/housework;Direct supervision/assist for financial management;Supervision due to cognitive status;Assist for transportation;Direct supervision/assist for medications management;Help with stairs or ramp for entrance   Equipment Recommendations       Recommendations for Other Services      Precautions / Restrictions  Precautions Precautions: Fall Recall of Precautions/Restrictions: Impaired       Mobility Bed Mobility Overal bed mobility: Modified Independent (HOB elevated with increased time to complete) Bed Mobility: Supine to Sit     Supine to sit: Modified independent (Device/Increase time)          Transfers Overall transfer level: Needs assistance Equipment used: Rolling walker (2 wheels) Transfers: Sit to/from Stand Sit to Stand: Contact guard assist           General transfer comment: CGA with cuing for safety with hand placement     Balance Overall balance assessment: Needs assistance   Sitting balance-Leahy Scale: Good     Standing balance support: During functional activity, Single extremity supported Standing balance-Leahy Scale: Poor Standing balance comment: Required UE for stability while standing at sink for grooming tasks                           ADL either performed or assessed with clinical judgement   ADL Overall ADL's : Needs assistance/impaired     Grooming: Brushing hair;Contact guard assist Grooming Details (indicate cue type and reason): Standing at sink with RW in place with patient managing grooming items as needed.                             Functional mobility during ADLs: Contact guard assist;Rolling walker (2 wheels) General ADL Comments: CGA progressing to supervision with amb with RW, and completing ADL tasks.    Extremity/Trunk Assessment              Vision  Perception     Praxis     Communication Communication Communication: No apparent difficulties   Cognition Arousal: Alert Behavior During Therapy: Impulsive Cognition: Cognition impaired         Attention impairment (select first level of impairment): Sustained attention                              Cueing   Cueing Techniques: Verbal cues  Exercises      Shoulder Instructions       General Comments       Pertinent Vitals/ Pain       Pain Assessment Pain Score: 0-No pain  Home Living                                          Prior Functioning/Environment              Frequency  Min 2X/week        Progress Toward Goals  OT Goals(current goals can now be found in the care plan section)  Progress towards OT goals: Progressing toward goals  Acute Rehab OT Goals Potential to Achieve Goals: Good  Plan      Co-evaluation          OT goals addressed during session: ADL's and self-care;Proper use of Adaptive equipment and DME      AM-PAC OT "6 Clicks" Daily Activity     Outcome Measure   Help from another person eating meals?: None Help from another person taking care of personal grooming?: A Little Help from another person toileting, which includes using toliet, bedpan, or urinal?: A Little Help from another person bathing (including washing, rinsing, drying)?: A Little Help from another person to put on and taking off regular upper body clothing?: None Help from another person to put on and taking off regular lower body clothing?: A Little 6 Click Score: 20    End of Session Equipment Utilized During Treatment: Gait belt;Rolling walker (2 wheels)  OT Visit Diagnosis: Other abnormalities of gait and mobility (R26.89);Unsteadiness on feet (R26.81);Muscle weakness (generalized) (M62.81)   Activity Tolerance Patient tolerated treatment well   Patient Left in bed;with call bell/phone within reach;with bed alarm set;with family/visitor present;with nursing/sitter in room (Nurse Tech at bedside with patient assessing vitals at end of session)   Nurse Communication          Time: 1914-7829 OT Time Calculation (min): 25 min  Charges: OT Treatments $Self Care/Home Management : 23-37 mins  Sherial Dimes OTR/L   Lucas Rushing 07/05/2023, 12:16 PM

## 2023-07-05 NOTE — Progress Notes (Signed)
 Nutrition Brief Note  Patient identified on the Malnutrition Screening Tool (MST) Report. RD also consulted for diet education related to diabetes.   Wt Readings from Last 15 Encounters:  06/29/23 60.7 kg  01/20/22 56.7 kg  12/09/21 56.7 kg  10/19/21 54.9 kg  07/03/21 55 kg  11/04/18 54.4 kg  11/28/17 59 kg  06/26/17 61 kg  09/30/16 59 kg   Meredith Martinez is a 73 y.o. female with medical history significant of HTN, HLD, DM, GERD, gastroparesis, diabetic neuropathy, chronic pain syndrome, who presents with altered mental status and headache.   Pt admitted with multiple acute rt cellebellar infract and acute or subacute lt thalamic infarct.   Reviewed I/O's: +120 ml x 24 hours and +1.5 L since admission  Spoke with pt and sister at bedside, who were pleasant and in good spirits today. Pt reports difficulty with menu secondary to lack of teeth. RD assisted pt with ordering her dinner. RD adjusted diet order to carb modified, as pt was concerned about receiving sugary beverages. Pt reports she received dentures about two years ago, but does not use due to pain.   Pt had concerned regarding her blood sugar and DM management. RD discussed stress of acute illness and how this impacts blood sugar.   RD provided "Carbohydrate Counting for People with Diabetes" and "Plate Method" handouts from the Academy of Nutrition and Dietetics. Discussed different food groups and their effects on blood sugar, emphasizing carbohydrate-containing foods. Provided list of carbohydrates and recommended serving sizes of common foods.  Discussed importance of controlled and consistent carbohydrate intake throughout the day. Provided examples of ways to balance meals/snacks and encouraged intake of high-fiber, whole grain complex carbohydrates. Teach back method used.  Expect fair compliance.  RD will also refer to [facility] for further education and reinforcement.    Medications reviewed and include vitamin B-12, lovenox , pepcid , and neurontin .   Lab Results  Component Value Date   HGBA1C 14.0 (H) 06/29/2023   PTA DM medications are 1000 mg metformin daily and 20 mg glipizide daily. Per DM coordinator note, pt was missing medication dosages PTA. Pt will discharge on insulin .   Labs reviewed: CBGS: 151-276 (inpatient orders for glycemic control are 0-15 units insulin  aspart TID with meals, 0-5 units insulin  aspart daily at bedtime, 3 units insulin  aspart TID with meals, and 18 units insulin  glargine-yfgn daily).    Current diet order is carb modified, patient is consuming approximately 50-100% of meals at this time. Labs and medications reviewed.   No nutrition interventions warranted at this time. If nutrition issues arise, please consult RD.   Herschel Lords, RD, LDN, CDCES Registered Dietitian III Certified Diabetes Care and Education Specialist If unable to reach this RD, please use "RD Inpatient" group chat on secure chat between hours of 8am-4 pm daily

## 2023-07-05 NOTE — Progress Notes (Signed)
  PROGRESS NOTE    Meredith Martinez  ZOX:[id number] DOB: [birth year] DOA: 06/29/2023 PCP: [facility]  109A/109A-AA  LOS: 5 days   Brief hospital course:   Assessment & Plan: Jaz Markwell is a 73 y.o. female with medical history significant of HTN, HLD, DM, GERD, gastroparesis, diabetic neuropathy, chronic pain syndrome, who presents with altered mental status and headache.    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  Echo showed no atrial level shunt.  Consulted Dr. Doretta Gant of neurology - ASA 81mg  daily + plavix  75mg  daily x21 days f/b ASA 81mg  daily monotherapy after that  --cont tele --Lipitor 80 --PT/OT   Acute metabolic encephalopathy:  Due to stroke.  Patient was confused and combative.   --mental status improved and back to baseline on 5/9   Type 2 diabetes mellitus with peripheral neuropathy [facility]):  Hyperglycemia Recent A1c 12.5, poorly controlled.  Patient has glipizide, Jardiance and metformin on med list.  --increase glargine to 18u daily --ACHS and SSI --cont mealtime 3u TID --diabetic coordinator to educate and teach pt how to use insulin    Hypertension associated with type 2 diabetes mellitus (HCC) --cont home Lisinopril   Hyperlipidemia associated with type 2 diabetes mellitus (HCC) --lipitor 80   Diabetic neuropathy (HCC) --cont gabapentin    Nausea vomiting, resolved --likely due to stroke, causing vertigo --supportive care  Headache --tylenol  PRN --migraine cocktail PRN    DVT prophylaxis: Lovenox  SQ Code Status: Full code  Family Communication:  Level of care: Med-Surg Dispo:   The patient is from: home Anticipated d/c is to: home.  Pt declined SNF rehab Anticipated d/c date is: tomorrow   Subjective and Interval History:  Pt complained about dys diet and said she could chew and wanted chicken.  Pt told TOC today that she wants  to go home instead of SNF rehab.   Objective: Vitals:   07/05/23 0735 07/05/23 1135 07/05/23 1527 07/05/23 1925  BP: (!) 113/55 (!) 130/46 (!) 102/50 (!) 141/50  Pulse: 65 61 62 60  Resp: 15 16 16 16   Temp: 98.3 F (36.8 C) 98.5 F (36.9 C) 98.2 F (36.8 C) 98.5 F (36.9 C)  TempSrc: Oral Oral  Oral  SpO2: 99% 100% 100% 99%  Weight:      Height:        Intake/Output Summary (Last 24 hours) at 07/05/2023 2150 Last data filed at 07/05/2023 0900 Gross per 24 hour  Intake 240 ml  Output --  Net 240 ml   Filed Weights   06/29/23 1821  Weight: 60.7 kg    Examination:   Constitutional: NAD, AAOx3 HEENT: conjunctivae and lids normal, EOMI CV: No cyanosis.   RESP: normal respiratory effort, on RA Neuro: II - XII grossly intact.   Psych: Normal mood and affect.     Data Reviewed: I have personally reviewed labs and imaging studies  Time spent: 35 minutes  Garrison Kanner, MD [facility] If 7PM-7AM, please contact night-coverage 07/05/2023, 9:50 PM

## 2023-07-06 ENCOUNTER — Other Ambulatory Visit: Payer: Self-pay

## 2023-07-06 DIAGNOSIS — I639 Cerebral infarction, unspecified: Secondary | ICD-10-CM | POA: Diagnosis not present

## 2023-07-06 DIAGNOSIS — E785 Hyperlipidemia, unspecified: Secondary | ICD-10-CM

## 2023-07-06 DIAGNOSIS — K3184 Gastroparesis: Secondary | ICD-10-CM

## 2023-07-06 DIAGNOSIS — I152 Hypertension secondary to endocrine disorders: Secondary | ICD-10-CM

## 2023-07-06 DIAGNOSIS — G894 Chronic pain syndrome: Secondary | ICD-10-CM

## 2023-07-06 DIAGNOSIS — E1159 Type 2 diabetes mellitus with other circulatory complications: Secondary | ICD-10-CM | POA: Diagnosis not present

## 2023-07-06 DIAGNOSIS — E1142 Type 2 diabetes mellitus with diabetic polyneuropathy: Secondary | ICD-10-CM | POA: Diagnosis not present

## 2023-07-06 DIAGNOSIS — G9341 Metabolic encephalopathy: Secondary | ICD-10-CM

## 2023-07-06 DIAGNOSIS — E1169 Type 2 diabetes mellitus with other specified complication: Secondary | ICD-10-CM

## 2023-07-06 LAB — GLUCOSE, CAPILLARY
Glucose-Capillary: 211 mg/dL — ABNORMAL HIGH (ref 70–99)
Glucose-Capillary: 188 mg/dL — ABNORMAL HIGH (ref 70–99)

## 2023-07-06 MED ORDER — CYANOCOBALAMIN 1000 MCG PO TABS
1000.0000 ug | ORAL_TABLET | Freq: Every day | ORAL | 1 refills | Status: DC
  Filled 2023-07-06: qty 30, 30d supply, fill #0

## 2023-07-06 MED ORDER — ACCU-CHEK GUIDE W/DEVICE KIT
PACK | 0 refills | Status: DC
  Filled 2023-07-06: qty 1, 30d supply, fill #0

## 2023-07-06 MED ORDER — INSULIN GLARGINE 100 UNIT/ML SOLOSTAR PEN
20.0000 [IU] | PEN_INJECTOR | Freq: Every day | SUBCUTANEOUS | 3 refills | Status: AC
Start: 2023-07-06 — End: ?
  Filled 2023-07-06: qty 15, 75d supply, fill #0

## 2023-07-06 MED ORDER — ACCU-CHEK SOFTCLIX LANCETS MISC
0 refills | Status: AC
  Filled 2023-07-06: qty 100, 34d supply, fill #0

## 2023-07-06 MED ORDER — CLOPIDOGREL BISULFATE 75 MG PO TABS
75.0000 mg | ORAL_TABLET | Freq: Every day | ORAL | 0 refills | Status: AC
  Filled 2023-07-06: qty 20, 20d supply, fill #0

## 2023-07-06 MED ORDER — ASPIRIN 81 MG PO TBEC
81.0000 mg | DELAYED_RELEASE_TABLET | Freq: Every day | ORAL | 12 refills | Status: DC
  Filled 2023-07-06: qty 30, 30d supply, fill #0

## 2023-07-06 MED ORDER — GLUCOSE BLOOD VI STRP
ORAL_STRIP | 0 refills | Status: AC
  Filled 2023-07-06: qty 50, 16d supply, fill #0

## 2023-07-06 MED ORDER — PEN NEEDLES 32G X 4 MM MISC
3 refills | Status: DC
  Filled 2023-07-06: qty 100, 100d supply, fill #0

## 2023-07-06 MED ORDER — ATORVASTATIN CALCIUM 80 MG PO TABS
80.0000 mg | ORAL_TABLET | Freq: Every day | ORAL | 1 refills | Status: DC
  Filled 2023-07-06: qty 90, 90d supply, fill #0

## 2023-07-06 NOTE — TOC Transition Note (Signed)
 Transition of Care [name]) - Discharge Note   Patient Details  Name: Meredith Martinez MRN: [id number] Date of Birth: 03-06-1950  [facility] CM/SW Contact:  Crayton Docker, RN 07/06/2023, 11:29 AM   Clinical Narrative:     Discharge orders noted for home. CM to patient's room regarding [facility] following for home health PT/OT. Patient's sister, Haskell Linker is at patient's bedside and will provide transportation home and caregiver support. Patient and patient's sister verbalized understanding and agreement. CM secure message to [facility] regarding pending discharge. CM call to [facility] regarding possible [facility] for diabetic management. Per [facility], agency can add [facility] services.    Final next level of care: Home w Home Health Services Barriers to Discharge: No Barriers Identified   Patient Goals and CMS Choice   Home with home health PT/OT  Discharge Placement       Home with home health PT/OT          Discharge Plan and Services Additional resources added to the After Visit Summary for     Discharge Planning Services: CM Consult               Social Drivers of Health (SDOH) Interventions SDOH Screenings   Food Insecurity: No Food Insecurity (06/30/2023)  Housing: Patient Declined (06/30/2023)  Transportation Needs: Patient Declined (06/30/2023)  Utilities: Patient Declined (06/30/2023)  Social Connections: Moderately Isolated (06/30/2023)  Tobacco Use: Medium Risk (01/20/2022)     Readmission Risk Interventions     No data to display

## 2023-07-06 NOTE — Inpatient Diabetes Management (Addendum)
 Inpatient Diabetes Program Recommendations  AACE/ADA: New Consensus Statement on Inpatient Glycemic Control   Target Ranges:  Prepandial:   less than 140 mg/dL      Peak postprandial:   less than 180 mg/dL (1-2 hours)      Critically ill patients:  140 - 180 mg/dL    Latest Reference Range & Units 07/05/23 07:30 07/05/23 11:45 07/05/23 15:29 07/05/23 19:36 07/06/23 08:39  Glucose-Capillary 70 - 99 mg/dL 132 (H) 440 (H) 102 (H) 137 (H) 211 (H)   Review of Glycemic Control  Diabetes history: DM2 Outpatient Diabetes medications: Glipizide 20 mg daily, Jardiance 25 mg daily, Metformin 1000 mg daily Current orders for Inpatient glycemic control: Semglee  18 units daily, Novolog  0-15 units TID with meals, Novolog  0-5 units at bedtime, Novolog  3 units TID with meals   Inpatient Diabetes Program Recommendations:     Insulin : Noted Semglee  increased to 18 units daily.    HbgA1C:  A1C 14% on 06/29/23.   Outpatient DM: At discharge, would benefit from simplified DM medication regimen. Therefore, recommend discharging on Lantus  15 units daily, Metformin 1000 mg daily, and Jardiance 25 mg daily.  If patient is discharged on insulin , please provide Rx for Lantus  Solostar pens [phone]), insulin  pen needles [phone]), and glucose monitoring kit (#[id number]).    Addendum 07/06/23@11 :20-Spoke with patient and her sister Haskell Linker at bedside. Patient reports that when I seen her on Monday and reviewed insulin  and DM control, her niece Ardelia Beau was in the room. Educated patient and Haskell Linker on insulin  pen use at home. Reviewed all steps of insulin  pen including attachment of needle, 2-unit air shot, dialing up dose, giving injection, removing needle, disposal of sharps, storage of unused insulin , disposal of insulin  etc. Patient was able to provide successful return demonstration with some verbal cues. Patient's sister Haskell Linker was able demonstrate how to use the insulin  pen without any issues.  Added insulin  pen instructions to  D/C education material; also added insulin  pen video (use QR code) and showed Nany how to use the QR code to watch the video on using an insulin  pen. Discussed glucose monitoring, glucose and A1C goals, and reviewed A1C of 14%. Encouraged patient to be sure to follow instructions at discharge regarding DM medications,to take medication consistently as prescribed, to check glucose at home before meals and before bedtime, to follow up with her PCP regarding DM. Informed patient that her discharged medications were suppose to be delivered to her room prior to discharge. Patient and sister verbalized understanding of information and have no questions at this time.  Thanks, Beacher Limerick, RN, MSN, CDCES Diabetes Coordinator Inpatient Diabetes Program [phone] (Team Pager from 8am to 5pm)

## 2023-07-06 NOTE — Discharge Summary (Signed)
 Physician Discharge Summary   Patient: Meredith Martinez MRN: [id number] DOB: 1950-06-07  Admit date:     06/29/2023  Discharge date: 07/06/23  Discharge Physician: Luna Salinas   PCP: [facility]   Recommendations at discharge:  Please obtain CBC and BMP on follow-up Follow-up with primary care provider Follow-up with neurology  Discharge Diagnoses: Principal Problem:   Stroke [facility]) Active Problems:   Acute metabolic encephalopathy   Type 2 diabetes mellitus with peripheral neuropathy (HCC)   Hypertension associated with type 2 diabetes mellitus (HCC)   Hyperlipidemia associated with type 2 diabetes mellitus (HCC)   Diabetic neuropathy (HCC)   Nausea & vomiting   Gastroparesis   Chronic pain syndrome   AMS (altered mental status)   Hospital Course: Meredith Martinez is a 73 y.o. female with medical history significant of HTN, HLD, DM, GERD, gastroparesis, diabetic neuropathy, chronic pain syndrome, who presents with altered mental status and headache.   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 Echo showed no atrial level shunt.  Neurology was consulted.  Patient completed stroke workup.  Neurology recommended DAPT with aspirin  and Plavix  for 3 weeks followed by aspirin  monotherapy only.  She was also started on Lipitor.  Her mental status improved to baseline.  Patient was also found to have uncontrolled diabetes mellitus with hyperglycemia, A1c of 12.5, patient was on glipizide, Jardiance and metformin at home.  We discontinued glipizide and started her on Lantus  20 units daily, she received SSI with some [place] while in the hospital.  Patient needed close follow-up with primary care provider for better control of diabetes to decrease the risk of recurrent stroke.  Physical therapist initially recommended short-term rehab but patient wants to go home with  home health which was ordered.  Patient will continue the rest of her home medications and follow-up with her providers for further assistance.   Consultants: Neurology Procedures performed: None Disposition: Home health Diet recommendation:  Discharge Diet Orders (From admission, onward)     Start     Ordered   07/06/23 0000  Diet - low sodium heart healthy        07/06/23 1037           Cardiac and Carb modified diet DISCHARGE MEDICATION: Allergies as of 07/06/2023       Reactions   Amoxicillin Nausea And Vomiting   Iodine         Medication List     STOP taking these medications    glipiZIDE 10 MG 24 hr tablet Commonly known as: GLUCOTROL XL   promethazine  25 MG tablet Commonly known as: PHENERGAN        TAKE these medications    alendronate 70 MG tablet Commonly known as: FOSAMAX Take 70 mg by mouth once a week.   aspirin  EC 81 MG tablet Take 1 tablet (81 mg total) by mouth daily. Swallow whole. Start taking on: Jul 07, 2023   atorvastatin  80 MG tablet Commonly known as: LIPITOR Tome 1 tableta (80 mg en total) por va oral diariamente. (Take 1 tablet (80 mg total) by mouth daily.) Start taking on: Jul 07, 2023   clopidogrel  75 MG tablet Commonly known as: PLAVIX  Take 1 tablet (75 mg total) by mouth daily for 20 days. Start taking on: Jul 07, 2023   cyanocobalamin 1000 MCG tablet Take 1 tablet (1,000 mcg total) by mouth daily. Start taking on: Jul 07, 2023   famotidine  40 MG tablet Commonly known as: PEPCID  Take 1 tablet (40 mg total) by mouth every evening.   gabapentin  400 MG capsule Commonly known as: NEURONTIN  Take 400 mg by mouth 3 (three) times daily.   Icy Hot Max Lidocaine  4-1 % Crea Generic drug: Lidocaine -Menthol Apply 1 Application topically at bedtime.   insulin  glargine-yfgn 100 UNIT/ML Pen Commonly known as: SEMGLEE  Inject 20 Units into the skin at bedtime.   Jardiance 25 MG Tabs tablet Generic drug:  empagliflozin Take 25 mg by mouth daily.   levocetirizine 5 MG tablet Commonly known as: XYZAL Take 5 mg by mouth daily.   lisinopril 10 MG tablet Commonly known as: ZESTRIL Take 10 mg by mouth daily.   metFORMIN 1000 MG tablet Commonly known as: GLUCOPHAGE Take 1,000 mg by mouth daily.   metoCLOPramide  10 MG tablet Commonly known as: REGLAN  Take 10 mg by mouth 4 (four) times daily.   montelukast 10 MG tablet Commonly known as: SINGULAIR Take 10 mg by mouth at bedtime.   Pen Needles 32G X 4 MM Misc Used to take insulin  injections   Salonpas Lidocaine  Plus 4-10 % Crea Generic drug: Lidocaine  HCl-Benzyl Alcohol Apply 1 Application topically at bedtime.   Vitamin D -3 25 MCG (1000 UT) Caps Take by mouth daily.        Follow-up Information     [facility] Follow up.   Specialty: General Practice Why: [id number] Contact information: [address] [place] [address] [phone]         [facility] Follow up.   Why: 05/13---Per Bartholomew Light, agency will follow for home health PT/OT Contact information: [address] [place] [address] [phone]         Rosan Comfort, MD. Schedule an appointment as soon as possible for a visit in 1 week(s).   Specialty: Neurology Contact information: [phone] [facility] [address] [facility] [place] [address] [phone]                Discharge Exam: Meredith Martinez Weights   06/29/23 1821  Weight: 60.7 kg   General.  Frail elderly lady, in no acute distress. Pulmonary.  Lungs clear bilaterally, normal respiratory effort. CV.  Regular rate and rhythm, no JVD, rub or murmur. Abdomen.  Soft, nontender, nondistended, BS positive. CNS.  Alert and oriented .  No focal neurologic deficit. Extremities.  No edema, no cyanosis, pulses intact and symmetrical. Psychiatry.  Judgment and insight appears normal.   Condition at discharge:  stable  The results of significant diagnostics from this hospitalization (including imaging, microbiology, ancillary and laboratory) are listed below for reference.   Imaging Studies: ECHOCARDIOGRAM COMPLETE Result Date: 06/30/2023    ECHOCARDIOGRAM REPORT   Patient Name:   WENDIE LAUGHREY Date of Exam: 06/30/2023 Medical Rec #:  [id number]         Height:       59.0 in Accession #:    [id number]        Weight:       133.8 lb Date of Birth:  1950/10/26         BSA:          1.555 m Patient Age:    72 years          BP:           119/69 mmHg Patient Gender: F  HR:           73 bpm. Exam Location:  Inpatient Procedure: 2D Echo (Both Spectral and Color Flow Doppler were utilized during            procedure). Indications:     stroke  History:         Patient has no prior history of Echocardiogram examinations.                  Risk Factors:Dyslipidemia, Diabetes and Hypertension.  Sonographer:     Dione Franks RDCS Referring Phys:  [id number] XILIN NIU Diagnosing Phys: Belva Boyden MD  Sonographer Comments: Image acquisition challenging due to uncooperative patient. IMPRESSIONS  1. Left ventricular ejection fraction, by estimation, is 60 to 65%. The left ventricle has normal function. The left ventricle has no regional wall motion abnormalities. Left ventricular diastolic parameters are consistent with Grade I diastolic dysfunction (impaired relaxation).  2. Right ventricular systolic function is normal. The right ventricular size is normal.  3. The mitral valve is normal in structure. No evidence of mitral valve regurgitation. No evidence of mitral stenosis.  4. The aortic valve is normal in structure. Aortic valve regurgitation is not visualized. No aortic stenosis is present.  5. The inferior vena cava is normal in size with greater than 50% respiratory variability, suggesting right atrial pressure of 3 mmHg. FINDINGS  Left Ventricle: Left ventricular ejection fraction, by estimation, is 60 to  65%. The left ventricle has normal function. The left ventricle has no regional wall motion abnormalities. Strain was performed and the global longitudinal strain is indeterminate. The left ventricular internal cavity size was normal in size. There is no left ventricular hypertrophy. Left ventricular diastolic parameters are consistent with Grade I diastolic dysfunction (impaired relaxation). Right Ventricle: The right ventricular size is normal. No increase in right ventricular wall thickness. Right ventricular systolic function is normal. Left Atrium: Left atrial size was normal in size. Right Atrium: Right atrial size was normal in size. Pericardium: There is no evidence of pericardial effusion. Mitral Valve: The mitral valve is normal in structure. No evidence of mitral valve regurgitation. No evidence of mitral valve stenosis. Tricuspid Valve: The tricuspid valve is normal in structure. Tricuspid valve regurgitation is not demonstrated. No evidence of tricuspid stenosis. Aortic Valve: The aortic valve is normal in structure. Aortic valve regurgitation is not visualized. No aortic stenosis is present. Pulmonic Valve: The pulmonic valve was normal in structure. Pulmonic valve regurgitation is not visualized. No evidence of pulmonic stenosis. Aorta: The aortic root is normal in size and structure. Venous: The inferior vena cava is normal in size with greater than 50% respiratory variability, suggesting right atrial pressure of 3 mmHg. IAS/Shunts: No atrial level shunt detected by color flow Doppler. Additional Comments: 3D was performed not requiring image post processing on an independent workstation and was indeterminate.  LEFT VENTRICLE PLAX 2D LVIDd:         4.10 cm   Diastology LVIDs:         2.40 cm   LV e' medial:    9.14 cm/s LV PW:         0.80 cm   LV E/e' medial:  7.9 LV IVS:        0.80 cm   LV e' lateral:   10.30 cm/s LVOT diam:     1.80 cm   LV E/e' lateral: 7.0 LV SV:         55 LV SV Index:  35  LVOT Area:     2.54 cm  RIGHT VENTRICLE             IVC RV Basal diam:  2.40 cm     IVC diam: 1.50 cm RV S prime:     10.90 cm/s TAPSE (M-mode): 1.8 cm LEFT ATRIUM             Index        RIGHT ATRIUM           Index LA diam:        3.00 cm 1.93 cm/m   RA Area:     10.30 cm LA Vol (A2C):   31.2 ml 20.07 ml/m  RA Volume:   22.50 ml  14.47 ml/m LA Vol (A4C):   25.1 ml 16.14 ml/m LA Biplane Vol: 28.6 ml 18.39 ml/m  AORTIC VALVE LVOT Vmax:   98.50 cm/s LVOT Vmean:  65.500 cm/s LVOT VTI:    0.216 m  AORTA Ao Root diam: 3.00 cm MITRAL VALVE MV Area (PHT): 3.08 cm    SHUNTS MV Decel Time: 246 msec    Systemic VTI:  0.22 m MV E velocity: 71.80 cm/s  Systemic Diam: 1.80 cm MV A velocity: 88.00 cm/s MV E/A ratio:  0.82 Belva Boyden MD Electronically signed by Belva Boyden MD Signature Date/Time: 06/30/2023/4:31:21 PM    Final    CT HEAD WO CONTRAST ( ) Result Date: 06/30/2023 CLINICAL DATA:  Provided history: Stroke, follow-up. Severe altered mental status in patient with acute right cerebellar stroke. Deterioration in neuro exam. Rule out hemorrhagic conversion. EXAM: CT HEAD WITHOUT CONTRAST TECHNIQUE: Contiguous axial images were obtained from the base of the skull through the vertex without intravenous contrast. RADIATION DOSE REDUCTION: This exam was performed according to the departmental dose-optimization program which includes automated exposure control, adjustment of the mA and/or kV according to patient size and/or use of iterative reconstruction technique. COMPARISON:  CT brain MRI 06/29/2023. Non-contrast head CT and CT angiogram head/neck 06/29/2023. FINDINGS: Brain: Mild generalized cerebral atrophy. Multiple acute infarcts within the right cerebellar hemisphere, some of which were better appreciated on the prior brain MRI of 06/29/2023. A known acute infarct within the left thalamus is occult by CT and was better appreciated on the prior MRI. No evidence of hemorrhagic conversion at sites of the  above described acute infarcts. No evidence of a new/interval acute infarct. Mild patchy and ill-defined hypoattenuation within the cerebral white matter, nonspecific but compatible with chronic small vessel ischemic disease. No extra-axial fluid collection. No evidence of an intracranial mass. No midline shift. Vascular: No hyperdense vessel.  Atherosclerotic calcifications. Skull: No calvarial fracture or aggressive osseous lesion. Sinuses/Orbits: No mass or acute finding within the imaged orbits. Minimal mucosal thickening within the bilateral ethmoid and right maxillary sinuses at the imaged levels. IMPRESSION: 1. Known acute infarcts within the left thalamus and right cerebellar hemisphere, some of which were better appreciated on the prior brain MRI 06/29/2023. No evidence of hemorrhagic conversion. No evidence of a new/interval acute infarct. 2. Background parenchymal atrophy and chronic small vessel ischemic disease. Electronically Signed   By: Bascom Lily D.O.   On: 06/30/2023 12:26   CT ABDOMEN PELVIS WO CONTRAST Result Date: 06/30/2023 CLINICAL DATA:  Recent vomiting EXAM: CT ABDOMEN AND PELVIS WITHOUT CONTRAST TECHNIQUE: Multidetector CT imaging of the abdomen and pelvis was performed following the standard protocol without IV contrast. RADIATION DOSE REDUCTION: This exam was performed according to the departmental dose-optimization program which includes automated  exposure control, adjustment of the mA and/or kV according to patient size and/or use of iterative reconstruction technique. COMPARISON:  06/25/2017 FINDINGS: Lower chest: Bibasilar atelectasis is noted. Hepatobiliary: Liver is within normal limits. Gallbladder is well distended. Some dependent density is noted likely related to cholelithiasis Pancreas: Unremarkable. No pancreatic ductal dilatation or surrounding inflammatory changes. Spleen: Normal in size without focal abnormality. Adrenals/Urinary Tract: Adrenal glands are within normal  limits. Kidneys show no bilateral excretion of contrast material. No obstructive changes are seen. The bladder is well distended with opacified and unopacified urine. Stomach/Bowel: The appendix is within normal limits. No obstructive or inflammatory changes of the colon are noted. Stomach and small bowel are within normal limits. Vascular/Lymphatic: Aortic atherosclerosis. No enlarged abdominal or pelvic lymph nodes. Reproductive: Uterus and bilateral adnexa are unremarkable. Other: No abdominal wall hernia or abnormality. No abdominopelvic ascites. Musculoskeletal: No acute or significant osseous findings. IMPRESSION: Dependent density within the gallbladder likely related to small gallstones. No complicating factors are noted. No other focal abnormality is noted. Electronically Signed   By: Violeta Grey M.D.   On: 06/30/2023 02:12   MR BRAIN WO CONTRAST Result Date: 06/29/2023 CLINICAL DATA:  Neuro deficit, acute, stroke suspected EXAM: MRI HEAD WITHOUT CONTRAST TECHNIQUE: Multiplanar, multiecho pulse sequences of the brain and surrounding structures were obtained without intravenous contrast. COMPARISON:  Same day CT head. FINDINGS: Brain: Multiple acute right cerebellar infarcts. Acute or subacute left thalamic infarct. Associated edema without substantial mass effect. No midline shift, acute hemorrhage, mass lesion or hydrocephalus. Mild for age T2/FLAIR hyperintensities in the white matter, compatible with chronic microvascular ischemic disease. Vascular: Better evaluated on same day CTA. Skull and upper cervical spine: Normal marrow signal. Sinuses/Orbits: Clear sinuses.  No acute orbital findings. Other: No mastoid effusions. IMPRESSION: 1. Multiple acute right cerebellar infarcts. 2. Acute or subacute left thalamic infarct. Electronically Signed   By: Stevenson Elbe M.D.   On: 06/29/2023 21:07   CT ANGIO HEAD NECK W WO CM W PERF (CODE STROKE) Result Date: 06/29/2023 CLINICAL DATA:  Neuro deficit,  acute, stroke suspected worst headache of life, r/o aneurysm or dissection. Dizziness and vomiting. EXAM: CT ANGIOGRAPHY HEAD AND NECK CT PERFUSION BRAIN TECHNIQUE: Multidetector CT imaging of the head and neck was performed using the standard protocol during bolus administration of intravenous contrast. Multiplanar CT image reconstructions and MIPs were obtained to evaluate the vascular anatomy. Carotid stenosis measurements (when applicable) are obtained utilizing NASCET criteria, using the distal internal carotid diameter as the denominator. Multiphase CT imaging of the brain was performed following IV bolus contrast injection. Subsequent parametric perfusion maps were calculated using RAPID software. RADIATION DOSE REDUCTION: This exam was performed according to the departmental dose-optimization program which includes automated exposure control, adjustment of the mA and/or kV according to patient size and/or use of iterative reconstruction technique. CONTRAST:  OMNIPAQUE  IOHEXOL  350 MG/ML SOLN COMPARISON:  None Available. FINDINGS: CTA NECK FINDINGS Aortic arch: Standard branching with mild atherosclerosis. No significant stenosis of the arch vessel origin. Right carotid system: Patent with a small amount of calcified plaque in the proximal ICA. No significant stenosis or dissection. Left carotid system: Patent with a small amount of calcified and soft plaque in the carotid bulb. No evidence of a significant stenosis or dissection. Vertebral arteries: Patent without evidence of a stenosis, dissection, or significant atherosclerosis. Dominant left vertebral artery. Skeleton: Mild cervical spondylosis. Other neck: No evidence of cervical lymphadenopathy or mass. Upper chest: Mild biapical lung scarring. Mild bronchial  wall thickening. Partially visualized small left hilar lymph nodes. Review of the MIP images confirms the above findings CTA HEAD FINDINGS Anterior circulation: The internal carotid arteries  are patent from skull base to carotid termini with minimal nonstenotic plaque. ACAs and MCAs are patent without evidence of a proximal branch occlusion or proximal stenosis. No aneurysm is identified. Posterior circulation: The intracranial vertebral arteries are widely patent to the basilar. The basilar artery is widely patent. Posterior communicating arteries are diminutive or absent. The PCAs are patent without evidence of a significant proximal stenosis. There is a severe distal right P3 branch vessel stenosis. No aneurysm is identified. Venous sinuses: Poorly evaluated due to contrast timing. Anatomic variants: None. Review of the MIP images confirms the above findings CT Brain Perfusion Findings: The exam is motion degraded. ASPECTS: 10 CBF (<30%) Volume: 0 mL Perfusion (Tmax>6.0s) volume: 6mL, located peripherally along the inferior aspect of the left cerebellar hemisphere and along the anterior aspect of the falx and most likely artifactual Preliminary finding of no large vessel occlusion was communicated by telephone to Dr. Doretta Gant on 06/29/2023 at 6:13 p.m. IMPRESSION: 1. Mild atherosclerosis in the head and neck without a large vessel occlusion or significant proximal stenosis. 2. Motion degraded, noncontributory CTP. 3.  Aortic Atherosclerosis (ICD10-I70.0). Electronically Signed   By: Aundra Lee M.D.   On: 06/29/2023 18:34   CT HEAD CODE STROKE WO CONTRAST Result Date: 06/29/2023 CLINICAL DATA:  Code stroke. Neuro deficit, acute, stroke suspected. EXAM: CT HEAD WITHOUT CONTRAST TECHNIQUE: Contiguous axial images were obtained from the base of the skull through the vertex without intravenous contrast. RADIATION DOSE REDUCTION: This exam was performed according to the departmental dose-optimization program which includes automated exposure control, adjustment of the mA and/or kV according to patient size and/or use of iterative reconstruction technique. COMPARISON:  Head CT 07/03/2021 FINDINGS: Brain: A  new small hypodense region in the right cerebellar hemisphere is indeterminate but suspicious for a recent infarct. No acute supratentorial infarct, intracranial hemorrhage, midline shift, or extra-axial fluid collection is identified. Mild cerebral atrophy is within normal limits for age. Cerebral white matter hypodensities are nonspecific but compatible with mild chronic small vessel ischemic disease. Vascular: Calcified atherosclerosis at the skull base. No hyperdense vessel. Skull: No fracture or suspicious lesion. Sinuses/Orbits: Mild mucosal thickening in the right maxillary sinus. Clear mastoid air cells. Unremarkable orbits. Other: None. ASPECTS (Alberta Stroke Program Early CT Score) - Ganglionic level infarction (caudate, lentiform nuclei, internal capsule, insula, M1-M3 cortex): 7 - Supraganglionic infarction (M4-M6 cortex): 3 Total score (0-10 with 10 being normal): 10 These results were communicated to Dr. Doretta Gant at 5:49 pm on 06/29/2023 by text page via the [facility] messaging system. IMPRESSION: 1. Suspected recent right cerebellar infarct. Recommend MRI for further evaluation. 2. No intracranial hemorrhage or evidence of an acute supratentorial infarct. ASPECTS of 10. 3. Mild chronic small vessel ischemic disease. Electronically Signed   By: Aundra Lee M.D.   On: 06/29/2023 17:51    Microbiology: No results found for this or any previous visit.  Labs: CBC: Recent Labs  Lab 06/29/23 1732 06/30/23 0505  WBC 7.4 9.2  NEUTROABS 2.3  --   HGB 13.9 13.4  HCT 40.2 39.9  MCV 86.6 88.7  PLT 308 314   Basic Metabolic Panel: Recent Labs  Lab 06/29/23 1732 06/30/23 0505  NA 131* 138  K 3.6 3.8  CL 96* 99  CO2 25 29  GLUCOSE 395* 244*  BUN 12 13  CREATININE 0.69 0.71  CALCIUM  9.7 9.7   Liver Function Tests: Recent Labs  Lab 06/29/23 1732  AST 22  ALT 23  ALKPHOS 109  BILITOT 0.8  PROT 8.1  ALBUMIN 4.0   CBG: Recent Labs  Lab 07/05/23 0730 07/05/23 1145 07/05/23 1529  07/05/23 1936 07/06/23 0839  GLUCAP 249* 276* 102* 137* 211*    Discharge time spent: greater than 30 minutes.  This record has been created using Conservation officer, historic buildings. Errors have been sought and corrected,but may not always be located. Such creation errors do not reflect on the standard of care.   Signed: Luna Salinas, MD [facility] 07/06/2023

## 2023-07-06 NOTE — Progress Notes (Signed)
 OT Cancellation Note  Patient Details Name: Meredith Martinez MRN: [id number] DOB: 03-20-1950   Cancelled Treatment:    Reason Eval/Treat Not Completed: Other (comment): Attempted to see patient on this session.  Per patient and sister Meredith Martinez), patient is d/c'ing today to home with [facility] services and sister (Meredith Martinez) and niece Meredith Martinez) to stay with her temporarily at her home to provide support as needed. Per patient and sister, MD notified her that d/c plan changed because of difficulty finding SNF placement and family being able to temporarily stay with her at home.  Shortly after OT arrived, DM management RN came in to do DM management training with patient and sister.    Lucas Rushing 07/06/2023, 3:49 PM

## 2023-07-14 ENCOUNTER — Ambulatory Visit (INDEPENDENT_AMBULATORY_CARE_PROVIDER_SITE_OTHER): Admitting: Physician Assistant

## 2023-07-14 ENCOUNTER — Encounter: Payer: Self-pay | Admitting: Physician Assistant

## 2023-07-14 VITALS — BP 146/56 | HR 67 | Temp 97.8°F | Resp 16 | Ht 59.0 in | Wt 130.0 lb

## 2023-07-14 DIAGNOSIS — E1142 Type 2 diabetes mellitus with diabetic polyneuropathy: Secondary | ICD-10-CM | POA: Diagnosis not present

## 2023-07-14 DIAGNOSIS — I152 Hypertension secondary to endocrine disorders: Secondary | ICD-10-CM

## 2023-07-14 DIAGNOSIS — R5383 Other fatigue: Secondary | ICD-10-CM | POA: Diagnosis not present

## 2023-07-14 DIAGNOSIS — Z7689 Persons encountering health services in other specified circumstances: Secondary | ICD-10-CM

## 2023-07-14 DIAGNOSIS — E1159 Type 2 diabetes mellitus with other circulatory complications: Secondary | ICD-10-CM | POA: Diagnosis not present

## 2023-07-14 DIAGNOSIS — Z8673 Personal history of transient ischemic attack (TIA), and cerebral infarction without residual deficits: Secondary | ICD-10-CM

## 2023-07-14 MED ORDER — METFORMIN HCL 500 MG PO TABS: ORAL_TABLET | ORAL | 3 refills | Status: DC

## 2023-07-14 NOTE — Progress Notes (Signed)
 [facility] [address] [place], [place] [address]  Internal MEDICINE  Office Visit Note  Patient Name: Meredith Martinez  [id number]  [id number]  Date of Service: 08/03/2023   Complaints/HPI Pt is here for establishment of PCP. Chief Complaint  Patient presents with   New Patient (Initial Visit)   Quality Metric Gaps   HPI Pt is here to establish care -needs cbc and bmp rechecked -Recently was in the hospital for a stroke -has been contacted by home health and states someone has already come by -neurology appt is scheduled June 9th at 1:30 with Dr. Mason Sole -states she lives alone, her neighbor found her and called EMS for her. Hospital called niece who is POA, but since stroke family has been a lot for her.  -[facility] -reviewed med list from the hospital and pt is only taking metformin , plavix , ASA, lipitor, gabapentin , and insulin  -BG still running high not taking jardiance--refuses bc family had issues with this med. Will inc to 1000mg  metformin  AM and 500 PM, with insulin . Will also start lisinopril  for BP  Current Medication: Outpatient Encounter Medications as of 07/14/2023  Medication Sig Note   Accu-Chek Softclix Lancets lancets Use to check blood glucose two to three times daily    aspirin  EC 81 MG tablet Take 1 tablet (81 mg total) by mouth daily. Swallow whole.    atorvastatin  (LIPITOR) 80 MG tablet Take 1 tablet (80 mg total) by mouth daily.    Blood Glucose Monitoring Suppl (ACCU-CHEK GUIDE) w/Device KIT Use to check blood glucose two to three times daily    [EXPIRED] clopidogrel  (PLAVIX ) 75 MG tablet Take 1 tablet (75 mg total) by mouth daily for 20 days.    cyanocobalamin  1000 MCG tablet Take 1 tablet (1,000 mcg total) by mouth daily.    gabapentin  (NEURONTIN ) 400 MG capsule Take 400 mg by mouth 3 (three) times daily. 06/30/2023: Last filled 04-06-2023 90days   glucose blood test strip Use to check blood sugar two to three  times daily    insulin  glargine (LANTUS ) 100 UNIT/ML Solostar Pen Inject 20 Units into the skin at bedtime.    Insulin  Pen Needle (PEN NEEDLES) 32G X 4 MM MISC Used to take insulin  injections    metFORMIN  (GLUCOPHAGE ) 500 MG tablet Take 2 tablets by mouth in AM and 1 tablet by mouth in PM.    [DISCONTINUED] metFORMIN  (GLUCOPHAGE ) 1000 MG tablet Take 1,000 mg by mouth daily.    alendronate (FOSAMAX) 70 MG tablet Take 70 mg by mouth once a week. (Patient not taking: Reported on 07/14/2023)    Cholecalciferol (VITAMIN D -3) 1000 units CAPS Take by mouth daily. (Patient not taking: Reported on 07/14/2023)    famotidine  (PEPCID ) 40 MG tablet Take 1 tablet (40 mg total) by mouth every evening.    JARDIANCE 25 MG TABS tablet Take 25 mg by mouth daily. (Patient not taking: Reported on 07/14/2023)    levocetirizine (XYZAL) 5 MG tablet Take 5 mg by mouth daily. (Patient not taking: Reported on 07/14/2023)    Lidocaine  HCl-Benzyl Alcohol (SALONPAS LIDOCAINE  PLUS) 4-10 % CREA Apply 1 Application topically at bedtime. (Patient not taking: Reported on 07/14/2023)    Lidocaine -Menthol (ICY HOT MAX LIDOCAINE ) 4-1 % CREA Apply 1 Application topically at bedtime. (Patient not taking: Reported on 07/14/2023)    metoCLOPramide  (REGLAN ) 10 MG tablet Take 10 mg by mouth 4 (four) times daily. (Patient not taking: Reported on 07/14/2023)    montelukast (SINGULAIR) 10 MG  tablet Take 10 mg by mouth at bedtime. (Patient not taking: Reported on 07/14/2023)    [DISCONTINUED] lisinopril  (PRINIVIL ,ZESTRIL ) 10 MG tablet Take 10 mg by mouth daily. (Patient not taking: Reported on 07/14/2023)    No facility-administered encounter medications on file as of 07/14/2023.    Surgical History: Past Surgical History:  Procedure Laterality Date   BREAST BIOPSY Right    neg   ESOPHAGOGASTRODUODENOSCOPY (EGD) WITH PROPOFOL  N/A 06/27/2017   Procedure: ESOPHAGOGASTRODUODENOSCOPY (EGD) WITH PROPOFOL ;  Surgeon: Luke Salaam, MD;  Location[facility]  ENDOSCOPY;  Service: Gastroenterology;  Laterality: N/A;    Medical History: Past Medical History:  Diagnosis Date   Diabetes mellitus without complication (HCC)    niddm   Stroke (HCC)     Family History: Family History  Problem Relation Age of Onset   Heart disease Mother    Diabetes Mother    Arthritis Mother    Cancer Mother    Breast cancer Mother 68   Heart disease Father    Stroke Father    Diabetes Father     Social History   Socioeconomic History   Marital status: Single    Spouse name: Not on file   Number of children: Not on file   Years of education: Not on file   Highest education level: Not on file  Occupational History   Not on file  Tobacco Use   Smoking status: Former    Current packs/day: 0.00    Types: Cigarettes    Quit date: 2008    Years since quitting: 17.4   Smokeless tobacco: Never  Substance and Sexual Activity   Alcohol use: No   Drug use: No   Sexual activity: Not on file  Other Topics Concern   Not on file  Social History Narrative   Not on file   Social Drivers of Health   Financial Resource Strain: Not on file  Food Insecurity: No Food Insecurity (06/30/2023)   Hunger Vital Sign    Worried About Running Out of Food in the Last Year: Never true    Ran Out of Food in the Last Year: Never true  Transportation Needs: Patient Declined (06/30/2023)   PRAPARE - Administrator, Civil Service (Medical): Patient declined    Lack of Transportation (Non-Medical): Patient declined  Physical Activity: Not on file  Stress: Not on file  Social Connections: Moderately Isolated (06/30/2023)   Social Connection and Isolation Panel [NHANES]    Frequency of Communication with Friends and Family: More than three times a week    Frequency of Social Gatherings with Friends and Family: More than three times a week    Attends Religious Services: 1 to 4 times per year    Active Member of [employer] or Organizations: No    Attends Tax inspector Meetings: Never    Marital Status: Divorced  Catering manager Violence: Patient Declined (06/30/2023)   Humiliation, Afraid, Rape, and Kick questionnaire    Fear of Current or Ex-Partner: Patient declined    Emotionally Abused: Patient declined    Physically Abused: Patient declined    Sexually Abused: Patient declined     Review of Systems  Constitutional:  Positive for fatigue. Negative for chills and unexpected weight change.  HENT:  Positive for postnasal drip. Negative for congestion, rhinorrhea, sneezing and sore throat.   Eyes:  Negative for redness.  Respiratory:  Negative for cough, chest tightness and shortness of breath.   Cardiovascular:  Negative for chest pain  and palpitations.  Gastrointestinal:  Negative for abdominal pain, constipation, diarrhea, nausea and vomiting.  Genitourinary:  Negative for dysuria and frequency.  Musculoskeletal:  Negative for arthralgias, back pain, joint swelling and neck pain.  Skin:  Negative for rash.  Neurological: Negative.  Negative for tremors.  Hematological:  Negative for adenopathy. Does not bruise/bleed easily.  Psychiatric/Behavioral:  Negative for behavioral problems (Depression), sleep disturbance and suicidal ideas. The patient is not nervous/anxious.     Vital Signs: BP (!) 146/56 (Cuff Size: Small)   Pulse 67   Temp 97.8 F (36.6 C)   Resp 16   Ht 4' 11 (1.499 m)   Wt 130 lb (59 kg)   SpO2 98%   BMI 26.26 kg/m    Physical Exam Vitals and nursing note reviewed.  Constitutional:      General: She is not in acute distress.    Appearance: She is well-developed. She is not diaphoretic.  HENT:     Head: Normocephalic and atraumatic.  Eyes:     Extraocular Movements: Extraocular movements intact.  Neck:     Thyroid: No thyromegaly.     Vascular: No JVD.     Trachea: No tracheal deviation.  Cardiovascular:     Rate and Rhythm: Normal rate and regular rhythm.     Heart sounds: Normal heart sounds. No  murmur heard.    No friction rub. No gallop.  Pulmonary:     Effort: Pulmonary effort is normal. No respiratory distress.     Breath sounds: No wheezing or rales.  Chest:     Chest wall: No tenderness.  Skin:    General: Skin is warm and dry.  Neurological:     Mental Status: She is alert.  Psychiatric:        Behavior: Behavior normal.        Thought Content: Thought content normal.        Judgment: Judgment normal.       Assessment/Plan: 1. History of CVA (cerebrovascular accident) (Primary) Recently hospitalized and is on plavix  + ASA. Has neurology appt scheduled  2. Type 2 diabetes mellitus with peripheral neuropathy (HCC) Will increase metformin  and continue insulin . Pt declines Jardiance and has not been taking. Needs to monitor BG closely and keep log. - Basic Metabolic Panel (BMET) - CBC w/Diff/Platelet - metFORMIN  (GLUCOPHAGE ) 500 MG tablet; Take 2 tablets by mouth in AM and 1 tablet by mouth in PM.  Dispense: 270 tablet; Refill: 3  3. Hypertension associated with type 2 diabetes mellitus (HCC) Has not been taking lisinopril  and will start now. Monitor BP  4. Other fatigue Will recheck labs - Basic Metabolic Panel (BMET) - CBC w/Diff/Platelet  5. Encounter to establish care with new provider Reviewed hospital course and ordered labs and advised    General Counseling: Mahitha verbalizes understanding of the findings of todays visit and agrees with plan of treatment. I have discussed any further diagnostic evaluation that may be needed or ordered today. We also reviewed her medications today. she has been encouraged to call the office with any questions or concerns that should arise related to todays visit.    Counseling:    Orders Placed This Encounter  Procedures   Basic Metabolic Panel (BMET)   CBC w/Diff/Platelet    Meds ordered this encounter  Medications   metFORMIN  (GLUCOPHAGE ) 500 MG tablet    Sig: Take 2 tablets by mouth in AM and 1 tablet by  mouth in PM.    Dispense:  270  tablet    Refill:  3     This patient was seen by Taylor Favia, PA-C in collaboration with Dr. Verneta Gone as a part of collaborative care agreement.   Time spent:40 Minutes

## 2023-07-15 ENCOUNTER — Other Ambulatory Visit: Payer: Self-pay

## 2023-07-15 MED ORDER — LISINOPRIL 10 MG PO TABS: 10.0000 mg | ORAL_TABLET | Freq: Every day | ORAL | 3 refills | Status: DC

## 2023-07-16 LAB — CBC WITH DIFFERENTIAL/PLATELET
Basophils Absolute: 0.1 10*3/uL (ref 0.0–0.2)
Basos: 1 %
EOS (ABSOLUTE): 0.2 10*3/uL (ref 0.0–0.4)
Eos: 3 %
Hematocrit: 39 % (ref 34.0–46.6)
Hemoglobin: 12.3 g/dL (ref 11.1–15.9)
Immature Grans (Abs): 0 10*3/uL (ref 0.0–0.1)
Immature Granulocytes: 0 %
Lymphocytes Absolute: 2.4 10*3/uL (ref 0.7–3.1)
Lymphs: 35 %
MCH: 29.4 pg (ref 26.6–33.0)
MCHC: 31.5 g/dL (ref 31.5–35.7)
MCV: 93 fL (ref 79–97)
Monocytes Absolute: 0.5 10*3/uL (ref 0.1–0.9)
Monocytes: 8 %
Neutrophils Absolute: 3.7 10*3/uL (ref 1.4–7.0)
Neutrophils: 53 %
Platelets: 402 10*3/uL (ref 150–450)
RBC: 4.18 x10E6/uL (ref 3.77–5.28)
RDW: 12.3 % (ref 11.7–15.4)
WBC: 7 10*3/uL (ref 3.4–10.8)

## 2023-07-16 LAB — BASIC METABOLIC PANEL WITH GFR
BUN/Creatinine Ratio: 22 (ref 12–28)
BUN: 13 mg/dL (ref 8–27)
CO2: 24 mmol/L (ref 20–29)
Calcium: 9.9 mg/dL (ref 8.7–10.3)
Chloride: 101 mmol/L (ref 96–106)
Creatinine, Ser: 0.6 mg/dL (ref 0.57–1.00)
Glucose: 120 mg/dL — ABNORMAL HIGH (ref 70–99)
Potassium: 4.1 mmol/L (ref 3.5–5.2)
Sodium: 142 mmol/L (ref 134–144)
eGFR: 96 mL/min/{1.73_m2} (ref >59)

## 2023-07-26 ENCOUNTER — Telehealth: Payer: Self-pay

## 2023-07-27 NOTE — Telephone Encounter (Signed)
 Tried to call niece, will try to call back later.

## 2023-07-27 NOTE — Telephone Encounter (Signed)
 Called nurse back from yesterday and she said she will get in touch with family.

## 2023-08-09 ENCOUNTER — Other Ambulatory Visit: Payer: Self-pay

## 2023-08-09 ENCOUNTER — Telehealth: Payer: Self-pay

## 2023-08-09 MED ORDER — DEXCOM G7 RECEIVER DEVI: 0 refills | Status: DC

## 2023-08-09 MED ORDER — DEXCOM G7 SENSOR MISC: 1 refills | Status: DC

## 2023-08-09 NOTE — Telephone Encounter (Signed)
Sent dexcom

## 2023-08-22 ENCOUNTER — Ambulatory Visit: Admitting: Physician Assistant

## 2023-08-23 ENCOUNTER — Telehealth: Payer: Self-pay | Admitting: Physician Assistant

## 2023-08-23 NOTE — Telephone Encounter (Signed)
 Lvm to reschedule 08/22/2023 missed appointment-tw /

## 2023-08-29 ENCOUNTER — Other Ambulatory Visit: Payer: Self-pay

## 2023-08-29 ENCOUNTER — Telehealth: Payer: Self-pay

## 2023-08-29 MED ORDER — DEXCOM G7 RECEIVER DEVI: 0 refills | Status: DC

## 2023-08-29 NOTE — Telephone Encounter (Signed)
 LVM for patient's niece notifying that Dexcom receiver has been resent to pharmacy.

## 2023-09-01 ENCOUNTER — Telehealth: Payer: Self-pay | Admitting: Physician Assistant

## 2023-09-01 NOTE — Telephone Encounter (Signed)
 Left 2nd  vm regarding missed appointment-Toni

## 2023-09-07 ENCOUNTER — Telehealth: Payer: Self-pay

## 2023-09-07 NOTE — Telephone Encounter (Signed)
 Beverley from [place] called requesting verbal ok for social work to step in and help patient manage medications/lifestyle tasks since niece is not always available. Gave verbal ok.

## 2023-09-22 ENCOUNTER — Telehealth: Payer: Self-pay

## 2023-09-22 NOTE — Telephone Encounter (Signed)
 [facility] house call NP [phone] that pt glucose was 400 and she having dizziness and also pt had history of stroke  as per dfk lmom that pt need to go ED ASAP and also lmom to pt niece that please call us  back and spoke  with carlie again and as per her pt is going to Urgent care

## 2023-09-22 NOTE — Telephone Encounter (Signed)
 Pt advised by [facility] NP go to urgent care due to pt refused to go to ED due her glucose was 400 and she is having dizziness  and also dr fernand agreed to go to ED or urgent care

## 2023-09-27 ENCOUNTER — Telehealth: Payer: Self-pay

## 2023-09-27 DIAGNOSIS — E785 Hyperlipidemia, unspecified: Secondary | ICD-10-CM | POA: Diagnosis not present

## 2023-09-27 DIAGNOSIS — K219 Gastro-esophageal reflux disease without esophagitis: Secondary | ICD-10-CM | POA: Diagnosis not present

## 2023-09-27 DIAGNOSIS — E1165 Type 2 diabetes mellitus with hyperglycemia: Secondary | ICD-10-CM | POA: Diagnosis not present

## 2023-09-27 DIAGNOSIS — I10 Essential (primary) hypertension: Secondary | ICD-10-CM | POA: Diagnosis not present

## 2023-09-27 DIAGNOSIS — E1142 Type 2 diabetes mellitus with diabetic polyneuropathy: Secondary | ICD-10-CM | POA: Diagnosis not present

## 2023-09-27 DIAGNOSIS — Z794 Long term (current) use of insulin: Secondary | ICD-10-CM | POA: Diagnosis not present

## 2023-09-27 DIAGNOSIS — Z8673 Personal history of transient ischemic attack (TIA), and cerebral infarction without residual deficits: Secondary | ICD-10-CM | POA: Diagnosis not present

## 2023-09-27 DIAGNOSIS — G9341 Metabolic encephalopathy: Secondary | ICD-10-CM | POA: Diagnosis not present

## 2023-09-27 DIAGNOSIS — G894 Chronic pain syndrome: Secondary | ICD-10-CM | POA: Diagnosis not present

## 2023-09-27 DIAGNOSIS — Z7984 Long term (current) use of oral hypoglycemic drugs: Secondary | ICD-10-CM | POA: Diagnosis not present

## 2023-09-27 NOTE — Telephone Encounter (Signed)
 Pt niece called that her glucose better same day that why she never went to urgent care as per niece pt glucose no getting better advised to keep appt with lauren also if glucose high call us  back or go to urgent care

## 2023-10-03 DIAGNOSIS — Z7984 Long term (current) use of oral hypoglycemic drugs: Secondary | ICD-10-CM | POA: Diagnosis not present

## 2023-10-03 DIAGNOSIS — E1165 Type 2 diabetes mellitus with hyperglycemia: Secondary | ICD-10-CM | POA: Diagnosis not present

## 2023-10-03 DIAGNOSIS — G9341 Metabolic encephalopathy: Secondary | ICD-10-CM | POA: Diagnosis not present

## 2023-10-03 DIAGNOSIS — Z8673 Personal history of transient ischemic attack (TIA), and cerebral infarction without residual deficits: Secondary | ICD-10-CM | POA: Diagnosis not present

## 2023-10-03 DIAGNOSIS — G894 Chronic pain syndrome: Secondary | ICD-10-CM | POA: Diagnosis not present

## 2023-10-03 DIAGNOSIS — E785 Hyperlipidemia, unspecified: Secondary | ICD-10-CM | POA: Diagnosis not present

## 2023-10-03 DIAGNOSIS — E1142 Type 2 diabetes mellitus with diabetic polyneuropathy: Secondary | ICD-10-CM | POA: Diagnosis not present

## 2023-10-03 DIAGNOSIS — Z794 Long term (current) use of insulin: Secondary | ICD-10-CM | POA: Diagnosis not present

## 2023-10-03 DIAGNOSIS — K219 Gastro-esophageal reflux disease without esophagitis: Secondary | ICD-10-CM | POA: Diagnosis not present

## 2023-10-03 DIAGNOSIS — I10 Essential (primary) hypertension: Secondary | ICD-10-CM | POA: Diagnosis not present

## 2023-10-10 ENCOUNTER — Encounter: Payer: Self-pay | Admitting: Physician Assistant

## 2023-10-10 ENCOUNTER — Ambulatory Visit (INDEPENDENT_AMBULATORY_CARE_PROVIDER_SITE_OTHER): Admitting: Physician Assistant

## 2023-10-10 VITALS — BP 126/76 | HR 83 | Temp 98.0°F | Resp 16 | Ht 59.0 in | Wt 139.0 lb

## 2023-10-10 DIAGNOSIS — Z8673 Personal history of transient ischemic attack (TIA), and cerebral infarction without residual deficits: Secondary | ICD-10-CM

## 2023-10-10 DIAGNOSIS — E785 Hyperlipidemia, unspecified: Secondary | ICD-10-CM | POA: Diagnosis not present

## 2023-10-10 DIAGNOSIS — E1142 Type 2 diabetes mellitus with diabetic polyneuropathy: Secondary | ICD-10-CM

## 2023-10-10 DIAGNOSIS — M79606 Pain in leg, unspecified: Secondary | ICD-10-CM

## 2023-10-10 DIAGNOSIS — E1159 Type 2 diabetes mellitus with other circulatory complications: Secondary | ICD-10-CM

## 2023-10-10 DIAGNOSIS — G894 Chronic pain syndrome: Secondary | ICD-10-CM | POA: Diagnosis not present

## 2023-10-10 DIAGNOSIS — M7989 Other specified soft tissue disorders: Secondary | ICD-10-CM

## 2023-10-10 DIAGNOSIS — I152 Hypertension secondary to endocrine disorders: Secondary | ICD-10-CM

## 2023-10-10 DIAGNOSIS — Z794 Long term (current) use of insulin: Secondary | ICD-10-CM | POA: Diagnosis not present

## 2023-10-10 DIAGNOSIS — E1165 Type 2 diabetes mellitus with hyperglycemia: Secondary | ICD-10-CM | POA: Diagnosis not present

## 2023-10-10 DIAGNOSIS — Z7984 Long term (current) use of oral hypoglycemic drugs: Secondary | ICD-10-CM | POA: Diagnosis not present

## 2023-10-10 DIAGNOSIS — I10 Essential (primary) hypertension: Secondary | ICD-10-CM | POA: Diagnosis not present

## 2023-10-10 DIAGNOSIS — G9341 Metabolic encephalopathy: Secondary | ICD-10-CM | POA: Diagnosis not present

## 2023-10-10 DIAGNOSIS — K219 Gastro-esophageal reflux disease without esophagitis: Secondary | ICD-10-CM | POA: Diagnosis not present

## 2023-10-10 LAB — POCT GLYCOSYLATED HEMOGLOBIN (HGB A1C): Hemoglobin A1C: 12.7 % — AB (ref 4.0–5.6)

## 2023-10-10 MED ORDER — JARDIANCE 25 MG PO TABS: 25.0000 mg | ORAL_TABLET | Freq: Every day | ORAL | 2 refills | Status: DC

## 2023-10-10 MED ORDER — HYDROCHLOROTHIAZIDE 12.5 MG PO TABS: ORAL_TABLET | ORAL | 1 refills | Status: DC

## 2023-10-10 MED ORDER — FAMOTIDINE 20 MG PO TABS: 20.0000 mg | ORAL_TABLET | Freq: Every day | ORAL | 2 refills | Status: DC

## 2023-10-10 NOTE — Progress Notes (Signed)
 [facility] [address] [place], [place] [address]  Internal MEDICINE  Office Visit Note  Patient Name: Meredith Martinez  [id number]  [id number]  Date of Service: 10/10/2023  Chief Complaint  Patient presents with   Follow-up   Diabetes   Quality Metric Gaps    Dexa Scan, Colonoscopy, Diabetic Eye Exam, AWV, Mammogram    HPI Pt is here for routine follow up, unfortunately she missed previously scheduled appts -States she is only taking 1 metformin  BID? However bottle marked with 2 in AM and 1 PM still. Reports her Niece manages her meds and may be taking as prescribed. -states she took dexcom off, states it was making too much noise. Sounds like alarm for high sugars frequently and discussed the importance of this, but she is unwilling to retry this. Will prick finger instead. -niece gives her 20units insulin  at night -BG way too high still, willing to try jardiance  now and will resend to take 1/2 tab for 3-4 days then increase to full tab. Likely will need increase metformin  and/or insulin  as well -30day avg 316, pt had a reading over 400 and when office notified she was advised to go to ED but she refused -some cough for the last month, possible reflux? Non productive, dry cough. A little itch in throat. Could be ACEI as well and will restart pepcid  that she previously took and monitor. May need to change ACEI -leg pain bilaterally, left worse. Swelling. May be some neuropathy but already takes gabapentin . Will check US    Current Medication: Outpatient Encounter Medications as of 10/10/2023  Medication Sig Note   Accu-Chek Softclix Lancets lancets Use to check blood glucose two to three times daily    alendronate (FOSAMAX) 70 MG tablet Take 70 mg by mouth once a week.    aspirin  EC 81 MG tablet Take 1 tablet (81 mg total) by mouth daily. Swallow whole.    atorvastatin  (LIPITOR) 80 MG tablet Take 1 tablet (80 mg total) by mouth daily.    Blood Glucose Monitoring Suppl  (ACCU-CHEK GUIDE) w/Device KIT Use to check blood glucose two to three times daily    Cholecalciferol (VITAMIN D -3) 1000 units CAPS Take by mouth daily.    Continuous Glucose Receiver (DEXCOM G7 RECEIVER) DEVI Use as directed DX E11.65    Continuous Glucose Sensor (DEXCOM G7 SENSOR) MISC Use as directed for 10 days Dx E11.65    cyanocobalamin  1000 MCG tablet Take 1 tablet (1,000 mcg total) by mouth daily.    famotidine  (PEPCID ) 20 MG tablet Take 1 tablet (20 mg total) by mouth at bedtime.    gabapentin  (NEURONTIN ) 400 MG capsule Take 400 mg by mouth 3 (three) times daily. 06/30/2023: Last filled 04-06-2023 90days   glucose blood test strip Use to check blood sugar two to three times daily    hydrochlorothiazide  (HYDRODIURIL ) 12.5 MG tablet Take 1 tablet by mouth daily as needed for swelling    insulin  glargine (LANTUS ) 100 UNIT/ML Solostar Pen Inject 20 Units into the skin at bedtime.    Insulin  Pen Needle (PEN NEEDLES) 32G X 4 MM MISC Used to take insulin  injections    levocetirizine (XYZAL) 5 MG tablet Take 5 mg by mouth daily.    Lidocaine  HCl-Benzyl Alcohol (SALONPAS LIDOCAINE  PLUS) 4-10 % CREA Apply 1 Application topically at bedtime.    Lidocaine -Menthol (ICY HOT MAX LIDOCAINE ) 4-1 % CREA Apply 1 Application topically at bedtime.    lisinopril  (ZESTRIL ) 10 MG tablet Take 1 tablet (10 mg  total) by mouth daily.    metFORMIN  (GLUCOPHAGE ) 500 MG tablet Take 2 tablets by mouth in AM and 1 tablet by mouth in PM.    metoCLOPramide  (REGLAN ) 10 MG tablet Take 10 mg by mouth 4 (four) times daily.    montelukast (SINGULAIR) 10 MG tablet Take 10 mg by mouth at bedtime.    [DISCONTINUED] famotidine  (PEPCID ) 40 MG tablet Take 1 tablet (40 mg total) by mouth every evening.    [DISCONTINUED] JARDIANCE  25 MG TABS tablet Take 25 mg by mouth daily.    JARDIANCE  25 MG TABS tablet Take 1 tablet (25 mg total) by mouth daily.    No facility-administered encounter medications on file as of 10/10/2023.    Surgical  History: Past Surgical History:  Procedure Laterality Date   BREAST BIOPSY Right    neg   ESOPHAGOGASTRODUODENOSCOPY (EGD) WITH PROPOFOL  N/A 06/27/2017   Procedure: ESOPHAGOGASTRODUODENOSCOPY (EGD) WITH PROPOFOL ;  Surgeon: Therisa Bi, MD;  Location: [facility] ENDOSCOPY;  Service: Gastroenterology;  Laterality: N/A;    Medical History: Past Medical History:  Diagnosis Date   Diabetes mellitus without complication (HCC)    niddm   Stroke (HCC)     Family History: Family History  Problem Relation Age of Onset   Heart disease Mother    Diabetes Mother    Arthritis Mother    Cancer Mother    Breast cancer Mother 50   Heart disease Father    Stroke Father    Diabetes Father     Social History   Socioeconomic History   Marital status: Single    Spouse name: Not on file   Number of children: Not on file   Years of education: Not on file   Highest education level: Not on file  Occupational History   Not on file  Tobacco Use   Smoking status: Former    Current packs/day: 0.00    Types: Cigarettes    Quit date: 2008    Years since quitting: 17.6   Smokeless tobacco: Never  Substance and Sexual Activity   Alcohol use: No   Drug use: No   Sexual activity: Not on file  Other Topics Concern   Not on file  Social History Narrative   Not on file   Social Drivers of Health   Financial Resource Strain: Not on file  Food Insecurity: No Food Insecurity (06/30/2023)   Hunger Vital Sign    Worried About Running Out of Food in the Last Year: Never true    Ran Out of Food in the Last Year: Never true  Transportation Needs: Patient Declined (06/30/2023)   PRAPARE - Administrator, Civil Service (Medical): Patient declined    Lack of Transportation (Non-Medical): Patient declined  Physical Activity: Not on file  Stress: Not on file  Social Connections: Moderately Isolated (06/30/2023)   Social Connection and Isolation Panel    Frequency of Communication with Friends and  Family: More than three times a week    Frequency of Social Gatherings with Friends and Family: More than three times a week    Attends Religious Services: 1 to 4 times per year    Active Member of [employer] or Organizations: No    Attends Banker Meetings: Never    Marital Status: Divorced  Catering manager Violence: Patient Declined (06/30/2023)   Humiliation, Afraid, Rape, and Kick questionnaire    Fear of Current or Ex-Partner: Patient declined    Emotionally Abused: Patient declined  Physically Abused: Patient declined    Sexually Abused: Patient declined      Review of Systems  Constitutional:  Negative for chills and unexpected weight change.  HENT:  Positive for postnasal drip. Negative for congestion, rhinorrhea, sneezing and sore throat.   Eyes:  Negative for redness.  Respiratory:  Positive for cough. Negative for chest tightness and shortness of breath.   Cardiovascular:  Positive for leg swelling. Negative for chest pain and palpitations.  Gastrointestinal:  Negative for abdominal pain, constipation, diarrhea, nausea and vomiting.  Genitourinary:  Negative for dysuria and frequency.  Musculoskeletal:  Positive for arthralgias, gait problem and myalgias. Negative for back pain, joint swelling and neck pain.  Skin:  Negative for rash.  Neurological:  Negative for tremors.  Hematological:  Negative for adenopathy. Does not bruise/bleed easily.  Psychiatric/Behavioral:  Negative for behavioral problems (Depression), sleep disturbance and suicidal ideas. The patient is not nervous/anxious.     Vital Signs: BP 126/76   Pulse 83   Temp 98 F (36.7 C)   Resp 16   Ht 4' 11 (1.499 m)   Wt 139 lb (63 kg)   SpO2 98%   BMI 28.07 kg/m    Physical Exam Vitals and nursing note reviewed.  Constitutional:      General: She is not in acute distress.    Appearance: She is well-developed. She is not diaphoretic.  HENT:     Head: Normocephalic and atraumatic.   Eyes:     Extraocular Movements: Extraocular movements intact.  Neck:     Thyroid: No thyromegaly.     Vascular: No JVD.     Trachea: No tracheal deviation.  Cardiovascular:     Rate and Rhythm: Normal rate and regular rhythm.     Heart sounds: Normal heart sounds. No murmur heard.    No friction rub. No gallop.  Pulmonary:     Effort: Pulmonary effort is normal. No respiratory distress.     Breath sounds: No wheezing or rales.  Chest:     Chest wall: No tenderness.  Musculoskeletal:     Right lower leg: Edema present.     Left lower leg: Edema present.     Comments: Pain and swelling in bilateral LE  Skin:    General: Skin is warm and dry.  Neurological:     Mental Status: She is alert.  Psychiatric:        Behavior: Behavior normal.        Thought Content: Thought content normal.        Judgment: Judgment normal.        Assessment/Plan: 1. Type 2 diabetes mellitus with peripheral neuropathy (HCC) (Primary) - POCT HgB A1C is 12.7 which is improved from 14 last check, but is no where near controlled and needs further adjustment. Add jardiance , 1/2 tab 3-4 days then full tab daily. Continue insulin  and metformin  and will likely adjust pending Jardiance  response. Advised to monitor closely and keep log while improving diet. Encouraged to use [facility] but pt refuses to retry CGM  - Urine Microalbumin w/creat. ratio - JARDIANCE  25 MG TABS tablet; Take 1 tablet (25 mg total) by mouth daily.  Dispense: 30 tablet; Refill: 2  2. Pain and swelling of lower extremity, unspecified laterality May use hydrochlorothiazide  as needed to help swelling but advised to monitor BP. Will check US  as well. Likely partially due to neuropathy - US  ARTERIAL LOWER EXTREMITY DUPLEX BILATERAL; Future - hydrochlorothiazide  (HYDRODIURIL ) 12.5 MG tablet; Take 1 tablet by  mouth daily as needed for swelling  Dispense: 30 tablet; Refill: 1  3. History of CVA (cerebrovascular accident) Followed by  neurology  4. Hypertension associated with type 2 diabetes mellitus (HCC) Well controlled, continue lisinopril  though may need adjustment if cough persists   General Counseling: Malachi verbalizes understanding of the findings of todays visit and agrees with plan of treatment. I have discussed any further diagnostic evaluation that may be needed or ordered today. We also reviewed her medications today. she has been encouraged to call the office with any questions or concerns that should arise related to todays visit.    Orders Placed This Encounter  Procedures   US  ARTERIAL LOWER EXTREMITY DUPLEX BILATERAL   Urine Microalbumin w/creat. ratio   POCT HgB A1C    Meds ordered this encounter  Medications   JARDIANCE  25 MG TABS tablet    Sig: Take 1 tablet (25 mg total) by mouth daily.    Dispense:  30 tablet    Refill:  2   famotidine  (PEPCID ) 20 MG tablet    Sig: Take 1 tablet (20 mg total) by mouth at bedtime.    Dispense:  30 tablet    Refill:  2   hydrochlorothiazide  (HYDRODIURIL ) 12.5 MG tablet    Sig: Take 1 tablet by mouth daily as needed for swelling    Dispense:  30 tablet    Refill:  1    This patient was seen by Tinnie Pro, PA-C in collaboration with Dr. Sigrid Bathe as a part of collaborative care agreement.   Total time spent:40 Minutes Time spent includes review of chart, medications, test results, and follow up plan with the patient.      Dr Fozia M Khan Internal medicine

## 2023-10-11 LAB — MICROALBUMIN / CREATININE URINE RATIO
Creatinine, Urine: 138 mg/dL
Microalb/Creat Ratio: 295 mg/g{creat} — ABNORMAL HIGH (ref 0–29)
Microalbumin, Urine: 406.5 ug/mL

## 2023-10-17 ENCOUNTER — Other Ambulatory Visit: Payer: Self-pay

## 2023-10-18 ENCOUNTER — Other Ambulatory Visit: Payer: Self-pay | Admitting: Physician Assistant

## 2023-10-20 DIAGNOSIS — E113312 Type 2 diabetes mellitus with moderate nonproliferative diabetic retinopathy with macular edema, left eye: Secondary | ICD-10-CM | POA: Diagnosis not present

## 2023-10-21 ENCOUNTER — Ambulatory Visit: Admission: RE | Admit: 2023-10-21 | Source: Ambulatory Visit

## 2023-10-21 DIAGNOSIS — Z8673 Personal history of transient ischemic attack (TIA), and cerebral infarction without residual deficits: Secondary | ICD-10-CM | POA: Diagnosis not present

## 2023-10-21 DIAGNOSIS — Z7984 Long term (current) use of oral hypoglycemic drugs: Secondary | ICD-10-CM | POA: Diagnosis not present

## 2023-10-21 DIAGNOSIS — E1165 Type 2 diabetes mellitus with hyperglycemia: Secondary | ICD-10-CM | POA: Diagnosis not present

## 2023-10-21 DIAGNOSIS — K219 Gastro-esophageal reflux disease without esophagitis: Secondary | ICD-10-CM | POA: Diagnosis not present

## 2023-10-21 DIAGNOSIS — E1142 Type 2 diabetes mellitus with diabetic polyneuropathy: Secondary | ICD-10-CM | POA: Diagnosis not present

## 2023-10-21 DIAGNOSIS — E785 Hyperlipidemia, unspecified: Secondary | ICD-10-CM | POA: Diagnosis not present

## 2023-10-21 DIAGNOSIS — Z794 Long term (current) use of insulin: Secondary | ICD-10-CM | POA: Diagnosis not present

## 2023-10-21 DIAGNOSIS — G9341 Metabolic encephalopathy: Secondary | ICD-10-CM | POA: Diagnosis not present

## 2023-10-21 DIAGNOSIS — I10 Essential (primary) hypertension: Secondary | ICD-10-CM | POA: Diagnosis not present

## 2023-10-21 DIAGNOSIS — G894 Chronic pain syndrome: Secondary | ICD-10-CM | POA: Diagnosis not present

## 2023-10-31 ENCOUNTER — Ambulatory Visit: Admitting: Physician Assistant

## 2023-11-03 DIAGNOSIS — G894 Chronic pain syndrome: Secondary | ICD-10-CM | POA: Diagnosis not present

## 2023-11-03 DIAGNOSIS — K219 Gastro-esophageal reflux disease without esophagitis: Secondary | ICD-10-CM | POA: Diagnosis not present

## 2023-11-03 DIAGNOSIS — I10 Essential (primary) hypertension: Secondary | ICD-10-CM | POA: Diagnosis not present

## 2023-11-03 DIAGNOSIS — E1142 Type 2 diabetes mellitus with diabetic polyneuropathy: Secondary | ICD-10-CM | POA: Diagnosis not present

## 2023-11-03 DIAGNOSIS — E785 Hyperlipidemia, unspecified: Secondary | ICD-10-CM | POA: Diagnosis not present

## 2023-11-03 DIAGNOSIS — Z8673 Personal history of transient ischemic attack (TIA), and cerebral infarction without residual deficits: Secondary | ICD-10-CM | POA: Diagnosis not present

## 2023-11-03 DIAGNOSIS — E1165 Type 2 diabetes mellitus with hyperglycemia: Secondary | ICD-10-CM | POA: Diagnosis not present

## 2023-11-03 DIAGNOSIS — Z794 Long term (current) use of insulin: Secondary | ICD-10-CM | POA: Diagnosis not present

## 2023-11-03 DIAGNOSIS — Z7984 Long term (current) use of oral hypoglycemic drugs: Secondary | ICD-10-CM | POA: Diagnosis not present

## 2023-11-03 DIAGNOSIS — G9341 Metabolic encephalopathy: Secondary | ICD-10-CM | POA: Diagnosis not present

## 2023-11-09 ENCOUNTER — Telehealth: Payer: Self-pay | Admitting: Physician Assistant

## 2023-11-09 DIAGNOSIS — K219 Gastro-esophageal reflux disease without esophagitis: Secondary | ICD-10-CM | POA: Diagnosis not present

## 2023-11-09 DIAGNOSIS — E785 Hyperlipidemia, unspecified: Secondary | ICD-10-CM | POA: Diagnosis not present

## 2023-11-09 DIAGNOSIS — E1142 Type 2 diabetes mellitus with diabetic polyneuropathy: Secondary | ICD-10-CM | POA: Diagnosis not present

## 2023-11-09 DIAGNOSIS — E1165 Type 2 diabetes mellitus with hyperglycemia: Secondary | ICD-10-CM | POA: Diagnosis not present

## 2023-11-09 DIAGNOSIS — G9341 Metabolic encephalopathy: Secondary | ICD-10-CM | POA: Diagnosis not present

## 2023-11-09 DIAGNOSIS — Z7984 Long term (current) use of oral hypoglycemic drugs: Secondary | ICD-10-CM | POA: Diagnosis not present

## 2023-11-09 DIAGNOSIS — Z794 Long term (current) use of insulin: Secondary | ICD-10-CM | POA: Diagnosis not present

## 2023-11-09 DIAGNOSIS — Z8673 Personal history of transient ischemic attack (TIA), and cerebral infarction without residual deficits: Secondary | ICD-10-CM | POA: Diagnosis not present

## 2023-11-09 DIAGNOSIS — I10 Essential (primary) hypertension: Secondary | ICD-10-CM | POA: Diagnosis not present

## 2023-11-09 DIAGNOSIS — G894 Chronic pain syndrome: Secondary | ICD-10-CM | POA: Diagnosis not present

## 2023-11-09 NOTE — Telephone Encounter (Signed)
 Lvm to reschedule 10/31/2023 missed appointment-Toni

## 2023-11-12 ENCOUNTER — Other Ambulatory Visit: Payer: Self-pay | Admitting: Physician Assistant

## 2023-11-14 ENCOUNTER — Other Ambulatory Visit: Payer: Self-pay | Admitting: Physician Assistant

## 2023-11-14 ENCOUNTER — Telehealth: Payer: Self-pay

## 2023-11-14 MED ORDER — GABAPENTIN 400 MG PO CAPS: 400.0000 mg | ORAL_CAPSULE | Freq: Three times a day (TID) | ORAL | 1 refills | Status: DC

## 2023-11-14 NOTE — Telephone Encounter (Signed)
 I will refill her gabapentin , but pt no showed the US  of her leg I had ordered

## 2023-11-15 NOTE — Telephone Encounter (Signed)
 Niece Leonette was notified and she is aware and is going to try and get that reschedule

## 2023-11-18 ENCOUNTER — Ambulatory Visit
Admission: RE | Admit: 2023-11-18 | Discharge: 2023-11-18 | Disposition: A | Discharge status: Home / Self Care | Source: Ambulatory Visit | Attending: Physician Assistant | Admitting: Physician Assistant

## 2023-11-18 DIAGNOSIS — M79606 Pain in leg, unspecified: Secondary | ICD-10-CM | POA: Insufficient documentation

## 2023-11-18 DIAGNOSIS — M79605 Pain in left leg: Secondary | ICD-10-CM | POA: Diagnosis not present

## 2023-11-18 DIAGNOSIS — E119 Type 2 diabetes mellitus without complications: Secondary | ICD-10-CM | POA: Diagnosis not present

## 2023-11-18 DIAGNOSIS — R600 Localized edema: Secondary | ICD-10-CM | POA: Diagnosis not present

## 2023-11-18 DIAGNOSIS — M79604 Pain in right leg: Secondary | ICD-10-CM | POA: Diagnosis not present

## 2023-11-18 DIAGNOSIS — M7989 Other specified soft tissue disorders: Secondary | ICD-10-CM | POA: Insufficient documentation

## 2023-11-21 ENCOUNTER — Other Ambulatory Visit: Payer: Self-pay

## 2023-12-02 ENCOUNTER — Other Ambulatory Visit: Payer: Self-pay | Admitting: Physician Assistant

## 2023-12-05 NOTE — Progress Notes (Addendum)
 Meredith Martinez                                          MRN: [id number]   12/05/2023   The [facility] Quality Team Specialist reviewed this patient medical record for the purposes of chart review for care gap closure. The following were reviewed: chart review for care gap closure-glycemic status assessment.10/10/23 AND MOST RECENT LAB OF 01/13/2024, A1C LABS ARE OUT OF RANGE.     [facility] Quality Team

## 2023-12-08 ENCOUNTER — Ambulatory Visit: Admitting: Physician Assistant

## 2023-12-09 ENCOUNTER — Other Ambulatory Visit: Payer: Self-pay | Admitting: Physician Assistant

## 2023-12-09 DIAGNOSIS — M79606 Pain in leg, unspecified: Secondary | ICD-10-CM

## 2023-12-10 ENCOUNTER — Other Ambulatory Visit: Payer: Self-pay | Admitting: Physician Assistant

## 2023-12-25 ENCOUNTER — Emergency Department

## 2023-12-25 ENCOUNTER — Emergency Department
Admission: EM | Admit: 2023-12-25 | Discharge: 2023-12-25 | Disposition: A | Discharge status: Home / Self Care | Attending: Emergency Medicine | Admitting: Emergency Medicine

## 2023-12-25 DIAGNOSIS — I10 Essential (primary) hypertension: Secondary | ICD-10-CM | POA: Diagnosis not present

## 2023-12-25 DIAGNOSIS — E119 Type 2 diabetes mellitus without complications: Secondary | ICD-10-CM | POA: Insufficient documentation

## 2023-12-25 DIAGNOSIS — R1013 Epigastric pain: Secondary | ICD-10-CM | POA: Insufficient documentation

## 2023-12-25 DIAGNOSIS — R112 Nausea with vomiting, unspecified: Secondary | ICD-10-CM | POA: Insufficient documentation

## 2023-12-25 DIAGNOSIS — Z8673 Personal history of transient ischemic attack (TIA), and cerebral infarction without residual deficits: Secondary | ICD-10-CM | POA: Insufficient documentation

## 2023-12-25 LAB — URINALYSIS, ROUTINE W REFLEX MICROSCOPIC
Bacteria, UA: NONE SEEN
Bilirubin Urine: NEGATIVE
Glucose, UA: >=500 mg/dL — AB
Hgb urine dipstick: NEGATIVE
Ketones, ur: 5 mg/dL — AB
Nitrite: NEGATIVE
Protein, ur: 30 mg/dL — AB
Specific Gravity, Urine: 1.028 (ref 1.005–1.030)
pH: 7 (ref 5.0–8.0)

## 2023-12-25 LAB — COMPREHENSIVE METABOLIC PANEL WITH GFR
ALT: 26 U/L (ref 0–44)
AST: 37 U/L (ref 15–41)
Albumin: 4.6 g/dL (ref 3.5–5.0)
Alkaline Phosphatase: 106 U/L (ref 38–126)
Anion gap: 13 (ref 5–15)
BUN: 28 mg/dL — ABNORMAL HIGH (ref 8–23)
CO2: 25 mmol/L (ref 22–32)
Calcium: 10.1 mg/dL (ref 8.9–10.3)
Chloride: 95 mmol/L — ABNORMAL LOW (ref 98–111)
Creatinine, Ser: 0.9 mg/dL (ref 0.44–1.00)
GFR, Estimated: >60 mL/min (ref >60)
Glucose, Bld: 207 mg/dL — ABNORMAL HIGH (ref 70–99)
Potassium: 3.7 mmol/L (ref 3.5–5.1)
Sodium: 133 mmol/L — ABNORMAL LOW (ref 135–145)
Total Bilirubin: 1.2 mg/dL (ref 0.0–1.2)
Total Protein: 9.1 g/dL — ABNORMAL HIGH (ref 6.5–8.1)

## 2023-12-25 LAB — CBC
HCT: 43.5 % (ref 36.0–46.0)
Hemoglobin: 14.9 g/dL (ref 12.0–15.0)
MCH: 29.6 pg (ref 26.0–34.0)
MCHC: 34.3 g/dL (ref 30.0–36.0)
MCV: 86.5 fL (ref 80.0–100.0)
Platelets: 337 K/uL (ref 150–400)
RBC: 5.03 MIL/uL (ref 3.87–5.11)
RDW: 12.4 % (ref 11.5–15.5)
WBC: 5.5 K/uL (ref 4.0–10.5)
nRBC: 0 % (ref 0.0–0.2)

## 2023-12-25 LAB — RESP PANEL BY RT-PCR (RSV, FLU A&B, COVID)  RVPGX2
Influenza A by PCR: NEGATIVE
Influenza B by PCR: NEGATIVE
Resp Syncytial Virus by PCR: NEGATIVE
SARS Coronavirus 2 by RT PCR: NEGATIVE

## 2023-12-25 LAB — MAGNESIUM: Magnesium: 2.3 mg/dL (ref 1.7–2.4)

## 2023-12-25 LAB — LIPASE, BLOOD: Lipase: 25 U/L (ref 11–51)

## 2023-12-25 MED ORDER — ONDANSETRON HCL 4 MG/2ML IJ SOLN
4.0000 mg | Freq: Once | INTRAMUSCULAR | Status: AC
  Administered 2023-12-25: 4 mg via INTRAVENOUS
  Filled 2023-12-25: qty 2

## 2023-12-25 MED ORDER — DIPHENHYDRAMINE HCL 50 MG/ML IJ SOLN
12.5000 mg | Freq: Once | INTRAMUSCULAR | Status: AC
Start: 2023-12-25 — End: 2023-12-25
  Administered 2023-12-25: 12.5 mg via INTRAVENOUS
  Filled 2023-12-25: qty 1

## 2023-12-25 MED ORDER — KETOROLAC TROMETHAMINE 30 MG/ML IJ SOLN
15.0000 mg | Freq: Once | INTRAMUSCULAR | Status: AC
  Administered 2023-12-25: 15 mg via INTRAVENOUS
  Filled 2023-12-25: qty 1

## 2023-12-25 MED ORDER — LACTATED RINGERS IV BOLUS
1000.0000 mL | Freq: Once | INTRAVENOUS | Status: AC
  Administered 2023-12-25: 1000 mL via INTRAVENOUS

## 2023-12-25 MED ORDER — MORPHINE SULFATE (PF) 4 MG/ML IV SOLN
4.0000 mg | Freq: Once | INTRAVENOUS | Status: AC
  Administered 2023-12-25: 4 mg via INTRAVENOUS
  Filled 2023-12-25: qty 1

## 2023-12-25 MED ORDER — ONDANSETRON 4 MG PO TBDP: 4.0000 mg | ORAL_TABLET | Freq: Three times a day (TID) | ORAL | 0 refills | Status: DC | PRN

## 2023-12-25 MED ORDER — METOCLOPRAMIDE HCL 5 MG/ML IJ SOLN
10.0000 mg | Freq: Once | INTRAMUSCULAR | Status: AC
  Administered 2023-12-25: 10 mg via INTRAVENOUS
  Filled 2023-12-25: qty 2

## 2023-12-25 NOTE — Discharge Instructions (Addendum)
 Your blood work and urinalysis showed signs of dehydration so you were given IV fluids today.  Your CT scan did not show any acute abnormalities to explain your pain but they did note gallstones as well as kidney stones.  I have sent nausea medication to the pharmacy.  You can take this as needed for nausea and vomiting.  I do encourage you to follow-up with your primary care provider.  Please return to the emergency department with any worsening symptoms.

## 2023-12-25 NOTE — ED Notes (Signed)
 PT in no acute distress prior to discharge. Discharged instructions reviewed, all questions answered and pt verbalized understanding at this time. Pt has all belongings with them at time of discharge.

## 2023-12-25 NOTE — ED Triage Notes (Signed)
 Pt presents to the ED via POV from home for abdominal pain, nausea, vomiting, headache and bilateral leg pain x7 days. Pt A&Ox4.

## 2023-12-25 NOTE — ED Provider Notes (Signed)
 [facility] Provider Note    Event Date/Time   First MD Initiated Contact with Patient 12/25/23 1821     (approximate)   History   Chief Complaint Abdominal Pain   HPI  Meredith Martinez is a 73 y.o. female with past medical history of hypertension, diabetes, stroke, gastroparesis, and chronic pain syndrome who presents to the ED complaining of abdominal pain.  Patient reports that she has had 4 days of persistent pain in her epigastrium along with nausea and numerous episodes of vomiting.  She denies any changes in her bowel movements and has not any fevers, dysuria, or flank pain.  She does report acute on chronic pain to her lower extremities, describes history of neuropathy for which she takes gabapentin .  She has not had any falls or injuries to her legs.  She also denies any cough, chest pain, or shortness of breath.     Physical Exam   Triage Vital Signs: ED Triage Vitals  Encounter Vitals Group     BP 12/25/23 1803 137/71     Girls Systolic BP Percentile --      Girls Diastolic BP Percentile --      Boys Systolic BP Percentile --      Boys Diastolic BP Percentile --      Pulse Rate 12/25/23 1803 72     Resp 12/25/23 1803 18     Temp 12/25/23 1803 97.8 F (36.6 C)     Temp Source 12/25/23 1803 Oral     SpO2 12/25/23 1803 100 %     Weight 12/25/23 1804 138 lb 14.2 oz (63 kg)     Height 12/25/23 1804 4' 11 (1.499 m)     Head Circumference --      Peak Flow --      Pain Score 12/25/23 1803 10     Pain Loc --      Pain Education --      Exclude from Growth Chart --     Most recent vital signs: Vitals:   12/25/23 1803  BP: 137/71  Pulse: 72  Resp: 18  Temp: 97.8 F (36.6 C)  SpO2: 100%    Constitutional: Alert and oriented. Eyes: Conjunctivae are normal. Head: Atraumatic. Nose: No congestion/rhinnorhea. Mouth/Throat: Mucous membranes are moist.  Cardiovascular: Normal rate, regular rhythm. Grossly normal heart sounds.  2+  radial and DP pulses bilaterally. Respiratory: Normal respiratory effort.  No retractions. Lungs CTAB. Gastrointestinal: Soft and diffusely tender to palpation with no rebound or guarding. No distention. Musculoskeletal: No lower extremity tenderness nor edema.  Neurologic:  Normal speech and language. No gross focal neurologic deficits are appreciated.    ED Results / Procedures / Treatments   Labs (all labs ordered are listed, but only abnormal results are displayed) Labs Reviewed  COMPREHENSIVE METABOLIC PANEL WITH GFR - Abnormal; Notable for the following components:      Result Value   Sodium 133 (*)    Chloride 95 (*)    Glucose, Bld 207 (*)    BUN 28 (*)    Total Protein 9.1 (*)    All other components within normal limits  RESP PANEL BY RT-PCR (RSV, FLU A&B, COVID)  RVPGX2  LIPASE, BLOOD  CBC  URINALYSIS, ROUTINE W REFLEX MICROSCOPIC     EKG  ED ECG REPORT I, Carlin Palin, the attending physician, personally viewed and interpreted this ECG.   Date: 12/25/2023  EKG Time: 7:36  Rate: 60  Rhythm: normal sinus rhythm  Axis: RAD  Intervals:none  ST&T Change: None  PROCEDURES:  Critical Care performed: No  Procedures   MEDICATIONS ORDERED IN ED: Medications  morphine  (PF) 4 MG/ML injection 4 mg (4 mg Intravenous Given 12/25/23 1855)  ondansetron  (ZOFRAN ) injection 4 mg (4 mg Intravenous Given 12/25/23 1854)  lactated ringers bolus 1,000 mL (1,000 mLs Intravenous New Bag/Given 12/25/23 1854)  ketorolac (TORADOL) 30 MG/ML injection 15 mg (15 mg Intravenous Given 12/25/23 1940)     IMPRESSION / MDM / ASSESSMENT AND PLAN / ED COURSE  I reviewed the triage vital signs and the nursing notes.                              73 y.o. female with past medical history of hypertension, diabetes, stroke, gastroparesis, and chronic pain syndrome who presents to the ED complaining of abdominal pain with nausea and vomiting over the past 4 days.  Patient's presentation is  most consistent with acute presentation with potential threat to life or bodily function.  Differential diagnosis includes, but is not limited to, gastritis, GERD, pancreatitis, hepatitis, cholecystitis, biliary colic, bowel obstruction, dehydration, electrolyte abnormality, AKI, UTI, gastroparesis, ACS.  Patient nontoxic-appearing and in no acute distress, vital signs are unremarkable.  Pain is reproducible with palpation of her abdomen diffusely, will further assess with CT imaging.  CT will need to be performed without contrast given patient's reported allergy to iodine.  We will treat symptomatically with IV morphine  and Zofran , hydrate with IV fluids.  Labs are reassuring without significant anemia, leukocytosis, electrolyte abnormality, or AKI.  LFTs and lipase are unremarkable, urinalysis pending.  Pain to her lower extremities appears to be chronic and related to neuropathy.  She has strong DP pulses bilaterally with no signs of infection, edema, or calf tenderness to suggest DVT.  EKG without evidence of arrhythmia or ischemia, will treat with IV Toradol for ongoing pain.  Patient turned over to oncoming provider pending CT results and reassessment.      FINAL CLINICAL IMPRESSION(S) / ED DIAGNOSES   Final diagnoses:  Epigastric pain  Nausea and vomiting, unspecified vomiting type     Rx / DC Orders   ED Discharge Orders     None        Note:  This document was prepared using Dragon voice recognition software and may include unintentional dictation errors.   [name] Dunnings, MD 12/25/23 [phone]

## 2023-12-25 NOTE — ED Provider Notes (Signed)
 ----------------------------------------- 11:29 PM on 12/25/2023 -----------------------------------------  Blood pressure (!) 141/52, pulse 63, temperature 98.2 F (36.8 C), resp. rate 17, height 4' 11 (1.499 m), weight 63 kg, SpO2 99%.  Assuming care from Dr. [name], PA-C/NP-C.  In short, Meredith Martinez is a 72 y.o. female with a chief complaint of Abdominal Pain .  Refer to the original H&P for additional details.  The current plan of care is to wait for CT scan results, if normal, PO challenge and discharge if able to tolerate PO, admit if she cannot.  ____________________________________________    ED Results / Procedures / Treatments   Labs (all labs ordered are listed, but only abnormal results are displayed) Labs Reviewed  COMPREHENSIVE METABOLIC PANEL WITH GFR - Abnormal; Notable for the following components:      Result Value   Sodium 133 (*)    Chloride 95 (*)    Glucose, Bld 207 (*)    BUN 28 (*)    Total Protein 9.1 (*)    All other components within normal limits  URINALYSIS, ROUTINE W REFLEX MICROSCOPIC - Abnormal; Notable for the following components:   Color, Urine YELLOW (*)    APPearance CLEAR (*)    Glucose, UA >=500 (*)    Ketones, ur 5 (*)    Protein, ur 30 (*)    Leukocytes,Ua MODERATE (*)    Non Squamous Epithelial PRESENT (*)    All other components within normal limits  RESP PANEL BY RT-PCR (RSV, FLU A&B, COVID)  RVPGX2  LIPASE, BLOOD  CBC  MAGNESIUM     RADIOLOGY  I personally viewed and evaluated these images as part of my medical decision making, as well as reviewing the written report by the radiologist.  ED Provider Interpretation: Negative for acute abnormalities.  Does have cholelithiasis with nonspecific hydropic gallbladder and no definite CT findings of acute cholecystitis.  Nonobstructive punctate bilateral nephrolithiasis.  CT ABDOMEN PELVIS WO CONTRAST Result Date: 12/25/2023 EXAM: CT ABDOMEN AND PELVIS WITHOUT CONTRAST  12/25/2023 07:28:32 PM TECHNIQUE: CT of the abdomen and pelvis was performed without the administration of intravenous contrast. Multiplanar reformatted images are provided for review. Automated exposure control, iterative reconstruction, and/or weight-based adjustment of the mA/kV was utilized to reduce the radiation dose to as low as reasonably achievable. COMPARISON: CT abdomen and pelvis 06/30/2023. CLINICAL HISTORY: Abdominal pain, acute, nonlocalized. FINDINGS: LOWER CHEST: No acute abnormality. LIVER: The liver is unremarkable. GALLBLADDER AND BILE DUCTS: Layering hyperdensity within the gallbladder lumen. Myopic gallbladder. No gallbladder wall thickening or pericholecystic fluid. No biliary ductal dilatation. SPLEEN: No acute abnormality. PANCREAS: No acute abnormality. ADRENAL GLANDS: No acute abnormality. KIDNEYS, URETERS AND BLADDER: Punctate bilateral nephrolithiasis. No ureterolithiasis. No hydroureteronephrosis. No perinephric or periureteral stranding. Urinary bladder is unremarkable. GI AND BOWEL: Stomach demonstrates no acute abnormality. No small or large bowel wall thickening. Few scattered colonic diverticula. The appendix is normal in caliber. There is no bowel obstruction. PERITONEUM AND RETROPERITONEUM: No ascites. No free air. VASCULATURE: Aorta is normal in caliber. Moderate-to-severe atherosclerotic plaque. LYMPH NODES: No lymphadenopathy. REPRODUCTIVE ORGANS: Atrophic uterus versus hysterectomy. No adnexal mass. BONES AND SOFT TISSUES: No acute osseous abnormality. Tiny fat-containing umbilical hernia. IMPRESSION: 1. No acute findings in the abdomen or pelvis. 2. Cholelithiasis with nonspecific hydropic gallbladder and no definite CT findings of acute cholecystitis. 3. Nonobstructive punctate bilateral nephrolithiasis. 4. Tiny fat-containing umbilical hernia. Electronically signed by: Morgane Naveau MD 12/25/2023 07:37 PM EST RP Workstation: [id number]     PROCEDURES:  Critical Care  performed:  No  Procedures   MEDICATIONS ORDERED IN ED: Medications  morphine  (PF) 4 MG/ML injection 4 mg (4 mg Intravenous Given 12/25/23 1855)  ondansetron  (ZOFRAN ) injection 4 mg (4 mg Intravenous Given 12/25/23 1854)  lactated ringers bolus 1,000 mL (0 mLs Intravenous Stopped 12/25/23 2030)  ketorolac (TORADOL) 30 MG/ML injection 15 mg (15 mg Intravenous Given 12/25/23 1940)  metoCLOPramide  (REGLAN ) injection 10 mg (10 mg Intravenous Given 12/25/23 2045)  diphenhydrAMINE  (BENADRYL ) injection 12.5 mg (12.5 mg Intravenous Given 12/25/23 2054)  ondansetron  (ZOFRAN ) injection 4 mg (4 mg Intravenous Given 12/25/23 2224)     IMPRESSION / MDM / ASSESSMENT AND PLAN / ED COURSE  I reviewed the triage vital signs and the nursing notes.                             73 year old female presents for evaluation of abdominal pain.  Blood pressure is elevated, vital signs stable otherwise.  Patient's presentation is most consistent with acute complicated illness / injury requiring diagnostic workup.  Patient's diagnosis is consistent with epigastric abdominal pain with nausea and vomiting. Patient will be discharged home with prescriptions for Zofran . Patient is to follow up with PCP as needed or otherwise directed. Patient is given ED precautions to return to the ED for any worsening or new symptoms.  Clinical Course as of 12/25/23 2335  Meredith Martinez Dec 25, 2023  1953 Patient reassessed and reports some improvement in her pain after receiving the pain medications.  She was updated on the results of her CT scan.  Will try to p.o. challenge her.  If she is unable to tolerate p.o. we will plan to admit her to the hospital. [LD]  2049 Patient reassessed and she vomited after the p.o. challenge.  Will give Reglan  and Benadryl  and reassess. [LD]  2329 Patient reassessed after receiving the zofran  and she was able to tolerate PO. States she is ready to go home.  [LD]  2329 Will send her with a prescription for zofran .  [LD]  2332 Urinalysis, Routine w reflex microscopic -Urine, Clean Catch(!) Presence of glucose, ketones, protein, moderate leukocytes with 21-50 WBCs but no bacteria.  Patient does not report symptoms of UTI so we will hold off on treatment for now.  Presence of ketones suspect is due to dehydration. [LD]    Clinical Course User Index [LD] Cleaster Tinnie LABOR, PA-C    FINAL CLINICAL IMPRESSION(S) / ED DIAGNOSES   Final diagnoses:  Epigastric pain  Nausea and vomiting, unspecified vomiting type     Rx / DC Orders   ED Discharge Orders     None        Note:  This document was prepared using Dragon voice recognition software and may include unintentional dictation errors.    Cleaster Tinnie LABOR, PA-C 12/25/23 [id number]    Dicky Anes, MD 12/26/23 [phone]

## 2023-12-25 NOTE — ED Notes (Signed)
 Notified EDP [place] of pt pain not improved post morphine  and request for more pain medication.

## 2023-12-25 NOTE — ED Notes (Signed)
 Patient transported to CT

## 2023-12-26 ENCOUNTER — Ambulatory Visit: Admitting: Internal Medicine

## 2023-12-29 ENCOUNTER — Encounter: Payer: Self-pay | Admitting: Physician Assistant

## 2023-12-29 ENCOUNTER — Ambulatory Visit (INDEPENDENT_AMBULATORY_CARE_PROVIDER_SITE_OTHER): Admitting: Physician Assistant

## 2023-12-29 VITALS — BP 127/64 | HR 67 | Temp 98.0°F | Resp 16 | Ht 59.0 in | Wt 131.6 lb

## 2023-12-29 DIAGNOSIS — I70203 Unspecified atherosclerosis of native arteries of extremities, bilateral legs: Secondary | ICD-10-CM

## 2023-12-29 DIAGNOSIS — I10 Essential (primary) hypertension: Secondary | ICD-10-CM | POA: Diagnosis not present

## 2023-12-29 DIAGNOSIS — R252 Cramp and spasm: Secondary | ICD-10-CM

## 2023-12-29 DIAGNOSIS — Z1329 Encounter for screening for other suspected endocrine disorder: Secondary | ICD-10-CM

## 2023-12-29 DIAGNOSIS — R5383 Other fatigue: Secondary | ICD-10-CM

## 2023-12-29 DIAGNOSIS — E1142 Type 2 diabetes mellitus with diabetic polyneuropathy: Secondary | ICD-10-CM

## 2023-12-29 DIAGNOSIS — E782 Mixed hyperlipidemia: Secondary | ICD-10-CM

## 2023-12-29 DIAGNOSIS — E538 Deficiency of other specified B group vitamins: Secondary | ICD-10-CM

## 2023-12-29 DIAGNOSIS — Z8673 Personal history of transient ischemic attack (TIA), and cerebral infarction without residual deficits: Secondary | ICD-10-CM

## 2023-12-29 MED ORDER — JARDIANCE 25 MG PO TABS: 25.0000 mg | ORAL_TABLET | Freq: Every day | ORAL | 2 refills | Status: DC

## 2023-12-29 MED ORDER — PREGABALIN 25 MG PO CAPS: ORAL_CAPSULE | ORAL | 1 refills | Status: DC

## 2023-12-29 MED ORDER — CYANOCOBALAMIN 1000 MCG PO TABS: 1000.0000 ug | ORAL_TABLET | Freq: Every day | ORAL | 1 refills | Status: DC

## 2023-12-29 NOTE — Progress Notes (Addendum)
 [facility] [address] [place], [place] [address]  Internal MEDICINE  Office Visit Note  Patient Name: Meredith Martinez  [id number]  [id number]  Date of Service: 12/29/2023  Chief Complaint  Patient presents with   Follow-up    Reviews labs   Diabetes   Quality Metric Gaps    AWV, DEXA Scan, Mammogram, Eye exam    HPI Pt is here for routine follow up, niece is present and aids in providing information -Went to ED recently for epigastric pain, N/V -Vomiting improved now, things dont taste as good still despite being hungry. Some gallstones, but no cholecystitis seen in ED -possible UTI in ED as well but no culture done. Pt denies symptoms now -niece manages her medications -taking Jardiance  PM and insulin  PM, famotidine , lisinopril  10 and metformin  500 in AM only. States she moved jardiance  to nighttime because when took this with metformin  she had S/E, but did fine when taking separately. -BG now 220 before eating  -will take 2 metformin  in AM now, needed -gabapentin  not working and stopped taking this. Reports just made her more groggy without helping pain. She has significant neuropathic pain in legs and requests alternative -she did have arterial duplex showing no occlusive disease, but some degree or atherosclerosis. -will be seeing neurology again soon for follow up since stroke -needs nursing aid a few days per week to help with meds. Niece reports she has been in contact with [facility] Morna breech, [phone], and should be receiving paperwork/order request  Current Medication: Outpatient Encounter Medications as of 12/29/2023  Medication Sig   Accu-Chek Softclix Lancets lancets Use to check blood glucose two to three times daily   aspirin  EC 81 MG tablet Take 1 tablet (81 mg total) by mouth daily. Swallow whole.   atorvastatin  (LIPITOR) 80 MG tablet Take 1 tablet (80 mg total) by mouth daily.   Blood Glucose Monitoring Suppl (ACCU-CHEK GUIDE) w/Device  KIT Use to check blood glucose two to three times daily   Cholecalciferol (VITAMIN D -3) 1000 units CAPS Take by mouth daily.   Continuous Glucose Receiver (DEXCOM G7 RECEIVER) DEVI USE AS DIRECTED DX E11.65   Continuous Glucose Sensor (DEXCOM G7 SENSOR) MISC USE AS DIRECTED FOR 10 DAYS   glucose blood test strip Use to check blood sugar two to three times daily   insulin  glargine (LANTUS ) 100 UNIT/ML Solostar Pen Inject 20 Units into the skin at bedtime.   Insulin  Pen Needle (PEN NEEDLES) 32G X 4 MM MISC Used to take insulin  injections   levocetirizine (XYZAL) 5 MG tablet Take 5 mg by mouth daily.   Lidocaine  HCl-Benzyl Alcohol (SALONPAS LIDOCAINE  PLUS) 4-10 % CREA Apply 1 Application topically at bedtime.   Lidocaine -Menthol (ICY HOT MAX LIDOCAINE ) 4-1 % CREA Apply 1 Application topically at bedtime.   lisinopril  (ZESTRIL ) 10 MG tablet TAKE 1 TABLET BY MOUTH EVERY DAY   metoCLOPramide  (REGLAN ) 10 MG tablet Take 10 mg by mouth 4 (four) times daily.   ondansetron  (ZOFRAN -ODT) 4 MG disintegrating tablet Take 1 tablet (4 mg total) by mouth every 8 (eight) hours as needed for nausea or vomiting.   [DISCONTINUED] alendronate (FOSAMAX) 70 MG tablet Take 70 mg by mouth once a week.   [DISCONTINUED] cyanocobalamin  1000 MCG tablet Take 1 tablet (1,000 mcg total) by mouth daily.   [DISCONTINUED] famotidine  (PEPCID ) 20 MG tablet Take 1 tablet (20 mg total) by mouth at bedtime.   [DISCONTINUED] gabapentin  (NEURONTIN ) 400 MG capsule Take 1 capsule (400 mg total) by  mouth 3 (three) times daily.   [DISCONTINUED] hydrochlorothiazide  (HYDRODIURIL ) 12.5 MG tablet TAKE 1 TABLET BY MOUTH EVERY DAY AS NEEDED FOR SWELLING   [DISCONTINUED] JARDIANCE  25 MG TABS tablet Take 1 tablet (25 mg total) by mouth daily.   [DISCONTINUED] metFORMIN  (GLUCOPHAGE ) 500 MG tablet Take 2 tablets by mouth in AM and 1 tablet by mouth in PM.   [DISCONTINUED] montelukast (SINGULAIR) 10 MG tablet Take 10 mg by mouth at bedtime.    [DISCONTINUED] pregabalin (LYRICA) 25 MG capsule Take one tab po bid   cyanocobalamin  1000 MCG tablet Take 1 tablet (1,000 mcg total) by mouth daily.   JARDIANCE  25 MG TABS tablet Take 1 tablet (25 mg total) by mouth daily.   metFORMIN  (GLUCOPHAGE ) 500 MG tablet Take 2 tablets by mouth in AM   No facility-administered encounter medications on file as of 12/29/2023.    Surgical History: Past Surgical History:  Procedure Laterality Date   BREAST BIOPSY Right    neg   ESOPHAGOGASTRODUODENOSCOPY (EGD) WITH PROPOFOL  N/A 06/27/2017   Procedure: ESOPHAGOGASTRODUODENOSCOPY (EGD) WITH PROPOFOL ;  Surgeon: Therisa Bi, MD;  Location: [facility] ENDOSCOPY;  Service: Gastroenterology;  Laterality: N/A;    Medical History: Past Medical History:  Diagnosis Date   Diabetes mellitus without complication (HCC)    niddm   Stroke (HCC)     Family History: Family History  Problem Relation Age of Onset   Heart disease Mother    Diabetes Mother    Arthritis Mother    Cancer Mother    Breast cancer Mother 30   Heart disease Father    Stroke Father    Diabetes Father     Social History   Socioeconomic History   Marital status: Single    Spouse name: Not on file   Number of children: Not on file   Years of education: Not on file   Highest education level: Not on file  Occupational History   Not on file  Tobacco Use   Smoking status: Former    Current packs/day: 0.00    Types: Cigarettes    Quit date: 2008    Years since quitting: 17.8   Smokeless tobacco: Never  Substance and Sexual Activity   Alcohol use: No   Drug use: No   Sexual activity: Not on file  Other Topics Concern   Not on file  Social History Narrative   Not on file   Social Drivers of Health   Financial Resource Strain: Not on file  Food Insecurity: No Food Insecurity (06/30/2023)   Hunger Vital Sign    Worried About Running Out of Food in the Last Year: Never true    Ran Out of Food in the Last Year: Never true   Transportation Needs: Patient Declined (06/30/2023)   PRAPARE - Administrator, Civil Service (Medical): Patient declined    Lack of Transportation (Non-Medical): Patient declined  Physical Activity: Not on file  Stress: Not on file  Social Connections: Moderately Isolated (06/30/2023)   Social Connection and Isolation Panel    Frequency of Communication with Friends and Family: More than three times a week    Frequency of Social Gatherings with Friends and Family: More than three times a week    Attends Religious Services: 1 to 4 times per year    Active Member of [employer] or Organizations: No    Attends Banker Meetings: Never    Marital Status: Divorced  Intimate Partner Violence: Patient Declined (06/30/2023)  Humiliation, Afraid, Rape, and Kick questionnaire    Fear of Current or Ex-Partner: Patient declined    Emotionally Abused: Patient declined    Physically Abused: Patient declined    Sexually Abused: Patient declined      Review of Systems  Constitutional:  Negative for chills and unexpected weight change.  HENT:  Positive for postnasal drip. Negative for congestion, rhinorrhea, sneezing and sore throat.   Eyes:  Negative for redness.  Respiratory:  Negative for chest tightness and shortness of breath.   Cardiovascular:  Negative for chest pain and palpitations.  Gastrointestinal:  Negative for abdominal pain, constipation, diarrhea, nausea and vomiting.  Genitourinary:  Negative for dysuria and frequency.  Musculoskeletal:  Positive for arthralgias, gait problem and myalgias. Negative for back pain, joint swelling and neck pain.  Skin:  Negative for rash.  Neurological:  Negative for tremors.  Hematological:  Negative for adenopathy. Does not bruise/bleed easily.  Psychiatric/Behavioral:  Negative for behavioral problems (Depression), sleep disturbance and suicidal ideas. The patient is not nervous/anxious.     Vital Signs: BP 127/64   Pulse 67    Temp 98 F (36.7 C)   Resp 16   Ht 4' 11 (1.499 m)   Wt 131 lb 9.6 oz (59.7 kg)   SpO2 96%   BMI 26.58 kg/m    Physical Exam Vitals and nursing note reviewed.  Constitutional:      General: She is not in acute distress.    Appearance: She is well-developed. She is not diaphoretic.  HENT:     Head: Normocephalic and atraumatic.  Eyes:     Extraocular Movements: Extraocular movements intact.  Neck:     Thyroid: No thyromegaly.     Vascular: No JVD.     Trachea: No tracheal deviation.  Cardiovascular:     Rate and Rhythm: Normal rate and regular rhythm.     Heart sounds: Normal heart sounds. No murmur heard.    No friction rub. No gallop.  Pulmonary:     Effort: Pulmonary effort is normal. No respiratory distress.     Breath sounds: No wheezing or rales.  Chest:     Chest wall: No tenderness.  Skin:    General: Skin is warm and dry.  Neurological:     Mental Status: She is alert.  Psychiatric:        Behavior: Behavior normal.        Thought Content: Thought content normal.        Judgment: Judgment normal.        Assessment/Plan: 1. Type 2 diabetes mellitus with peripheral neuropathy (HCC) (Primary) Will increase to 2 tabs metformin  in AM and continue other medications as before to help improve sugars. Will order A1c with labs for monitoring. Will trial lyrica and titrate as needed for neuropathy and cautioned on potential S/E. Will also follow up with neurology. Patient will benefit from nurse aid to help with medication management - Hgb A1C w/o eAG - JARDIANCE  25 MG TABS tablet; Take 1 tablet (25 mg total) by mouth daily.  Dispense: 30 tablet; Refill: 2  2. Essential hypertension Stable, continue current medications  3. Atherosclerosis of artery of both lower extremities Continue lipitor and will monitor, may need vascular  4. History of CVA (cerebrovascular accident) Will be seeing neurology for follow up  5. B12 deficiency - B12 and Folate Panel  6.  Leg cramps - Magnesium  7. Thyroid disorder screen - TSH + free T4  8. Mixed hyperlipidemia -  Lipid Panel With LDL/HDL Ratio  9. Other fatigue - Comprehensive metabolic panel with GFR - CBC w/Diff/Platelet - B12 and Folate Panel - Magnesium - Lipid Panel With LDL/HDL Ratio - TSH + free T4   General Counseling: Anysa verbalizes understanding of the findings of todays visit and agrees with plan of treatment. I have discussed any further diagnostic evaluation that may be needed or ordered today. We also reviewed her medications today. she has been encouraged to call the office with any questions or concerns that should arise related to todays visit.    Orders Placed This Encounter  Procedures   Comprehensive metabolic panel with GFR   CBC w/Diff/Platelet   B12 and Folate Panel   Magnesium   Lipid Panel With LDL/HDL Ratio   TSH + free T4   Hgb A1C w/o eAG   Ambulatory referral to Home Health    Meds ordered this encounter  Medications   DISCONTD: pregabalin (LYRICA) 25 MG capsule    Sig: Take one tab po bid    Dispense:  60 capsule    Refill:  1   cyanocobalamin  1000 MCG tablet    Sig: Take 1 tablet (1,000 mcg total) by mouth daily.    Dispense:  90 tablet    Refill:  1   JARDIANCE  25 MG TABS tablet    Sig: Take 1 tablet (25 mg total) by mouth daily.    Dispense:  30 tablet    Refill:  2   metFORMIN  (GLUCOPHAGE ) 500 MG tablet    Sig: Take 2 tablets by mouth in AM    This patient was seen by Tinnie Pro, PA-C in collaboration with Dr. Sigrid Bathe as a part of collaborative care agreement.   Total time spent:35 Minutes Time spent includes review of chart, medications, test results, and follow up plan with the patient.      Dr Fozia M Khan Internal medicine

## 2024-01-05 ENCOUNTER — Telehealth: Payer: Self-pay

## 2024-01-05 NOTE — Telephone Encounter (Signed)
 Pt niece called Geni that she still having leg pain as per lauren advised her that take LYRICA 25 mg 2 tab at bed time if that not helping she can take 2 tab of LYRICA 25 mg  twice a daily and call us  back ;lauren will change prescription

## 2024-01-09 ENCOUNTER — Telehealth: Payer: Self-pay

## 2024-01-10 ENCOUNTER — Other Ambulatory Visit: Payer: Self-pay | Admitting: Physician Assistant

## 2024-01-10 DIAGNOSIS — M79606 Pain in leg, unspecified: Secondary | ICD-10-CM

## 2024-01-10 MED ORDER — TRAMADOL HCL 50 MG PO TABS: ORAL_TABLET | ORAL | 0 refills | Status: DC

## 2024-01-10 NOTE — Telephone Encounter (Signed)
 Pt niece advised as per lauren stop lyrica if not helping/side effects, oh ok great, can hold with dfk for now then and have her just get labsand follow with lauren  as planned. Lauren sent  short term tramadol until she can get in to address the neuropathy pain with neurology further on 12/8.

## 2024-01-11 ENCOUNTER — Telehealth: Payer: Self-pay

## 2024-01-11 MED ORDER — METFORMIN HCL 500 MG PO TABS: ORAL_TABLET | ORAL | Status: DC

## 2024-01-11 NOTE — Addendum Note (Signed)
 Addended by: Bosten Newstrom on: 01/11/2024 02:13 PM   Modules accepted: Orders

## 2024-01-11 NOTE — Telephone Encounter (Signed)
 Spoke with kelsey from [facility]  that we put in her home health order for nursing aid a few days per week and she forward this message to : [employer], [phone] due to pt niece already talking to lindsey

## 2024-01-14 LAB — CBC WITH DIFFERENTIAL/PLATELET
Basophils Absolute: 0.1 x10E3/uL (ref 0.0–0.2)
Basos: 1 %
EOS (ABSOLUTE): 0.1 x10E3/uL (ref 0.0–0.4)
Eos: 1 %
Hematocrit: 42.2 % (ref 34.0–46.6)
Hemoglobin: 14 g/dL (ref 11.1–15.9)
Immature Grans (Abs): 0 x10E3/uL (ref 0.0–0.1)
Immature Granulocytes: 0 %
Lymphocytes Absolute: 1.9 x10E3/uL (ref 0.7–3.1)
Lymphs: 26 %
MCH: 30.1 pg (ref 26.6–33.0)
MCHC: 33.2 g/dL (ref 31.5–35.7)
MCV: 91 fL (ref 79–97)
Monocytes Absolute: 0.4 x10E3/uL (ref 0.1–0.9)
Monocytes: 6 %
Neutrophils Absolute: 4.8 x10E3/uL (ref 1.4–7.0)
Neutrophils: 66 %
Platelets: 311 x10E3/uL (ref 150–450)
RBC: 4.65 x10E6/uL (ref 3.77–5.28)
RDW: 12.5 % (ref 11.7–15.4)
WBC: 7.2 x10E3/uL (ref 3.4–10.8)

## 2024-01-14 LAB — LIPID PANEL WITH LDL/HDL RATIO
Cholesterol, Total: 195 mg/dL (ref 100–199)
HDL: 38 mg/dL — ABNORMAL LOW (ref >39)
LDL Chol Calc (NIH): 119 mg/dL — ABNORMAL HIGH (ref 0–99)
LDL/HDL Ratio: 3.1 ratio (ref 0.0–3.2)
Triglycerides: 216 mg/dL — ABNORMAL HIGH (ref 0–149)
VLDL Cholesterol Cal: 38 mg/dL (ref 5–40)

## 2024-01-14 LAB — B12 AND FOLATE PANEL
Folate: 9 ng/mL (ref >3.0)
Vitamin B-12: 457 pg/mL (ref 232–1245)

## 2024-01-14 LAB — COMPREHENSIVE METABOLIC PANEL WITH GFR
ALT: 22 IU/L (ref 0–32)
AST: 29 IU/L (ref 0–40)
Albumin: 4.3 g/dL (ref 3.8–4.8)
Alkaline Phosphatase: 115 IU/L (ref 49–135)
BUN/Creatinine Ratio: 18 (ref 12–28)
BUN: 14 mg/dL (ref 8–27)
Bilirubin Total: 0.6 mg/dL (ref 0.0–1.2)
CO2: 21 mmol/L (ref 20–29)
Calcium: 10.4 mg/dL — ABNORMAL HIGH (ref 8.7–10.3)
Chloride: 98 mmol/L (ref 96–106)
Creatinine, Ser: 0.8 mg/dL (ref 0.57–1.00)
Globulin, Total: 3.3 g/dL (ref 1.5–4.5)
Glucose: 177 mg/dL — ABNORMAL HIGH (ref 70–99)
Potassium: 4.7 mmol/L (ref 3.5–5.2)
Sodium: 137 mmol/L (ref 134–144)
Total Protein: 7.6 g/dL (ref 6.0–8.5)
eGFR: 79 mL/min/1.73 (ref >59)

## 2024-01-14 LAB — MAGNESIUM: Magnesium: 1.9 mg/dL (ref 1.6–2.3)

## 2024-01-14 LAB — TSH+FREE T4
Free T4: 0.97 ng/dL (ref 0.82–1.77)
TSH: 0.881 u[IU]/mL (ref 0.450–4.500)

## 2024-01-14 LAB — HGB A1C W/O EAG: Hgb A1c MFr Bld: 12.3 % — ABNORMAL HIGH (ref 4.8–5.6)

## 2024-01-15 ENCOUNTER — Other Ambulatory Visit: Payer: Self-pay

## 2024-01-15 ENCOUNTER — Inpatient Hospital Stay
Admission: EM | Admit: 2024-01-15 | Discharge: 2024-01-20 | DRG: 073 | Disposition: A | Discharge status: Home / Self Care | Attending: Internal Medicine | Admitting: Internal Medicine

## 2024-01-15 ENCOUNTER — Emergency Department

## 2024-01-15 DIAGNOSIS — I7102 Dissection of abdominal aorta: Secondary | ICD-10-CM | POA: Diagnosis present

## 2024-01-15 DIAGNOSIS — E1151 Type 2 diabetes mellitus with diabetic peripheral angiopathy without gangrene: Secondary | ICD-10-CM | POA: Diagnosis present

## 2024-01-15 DIAGNOSIS — E876 Hypokalemia: Secondary | ICD-10-CM | POA: Diagnosis present

## 2024-01-15 DIAGNOSIS — Z8261 Family history of arthritis: Secondary | ICD-10-CM

## 2024-01-15 DIAGNOSIS — K8020 Calculus of gallbladder without cholecystitis without obstruction: Secondary | ICD-10-CM | POA: Diagnosis present

## 2024-01-15 DIAGNOSIS — Z8673 Personal history of transient ischemic attack (TIA), and cerebral infarction without residual deficits: Secondary | ICD-10-CM

## 2024-01-15 DIAGNOSIS — R1010 Upper abdominal pain, unspecified: Secondary | ICD-10-CM | POA: Diagnosis present

## 2024-01-15 DIAGNOSIS — Z794 Long term (current) use of insulin: Secondary | ICD-10-CM

## 2024-01-15 DIAGNOSIS — K3184 Gastroparesis: Secondary | ICD-10-CM | POA: Diagnosis present

## 2024-01-15 DIAGNOSIS — I7143 Infrarenal abdominal aortic aneurysm, without rupture: Secondary | ICD-10-CM

## 2024-01-15 DIAGNOSIS — R1011 Right upper quadrant pain: Secondary | ICD-10-CM

## 2024-01-15 DIAGNOSIS — Z880 Allergy status to penicillin: Secondary | ICD-10-CM

## 2024-01-15 DIAGNOSIS — Z888 Allergy status to other drugs, medicaments and biological substances status: Secondary | ICD-10-CM

## 2024-01-15 DIAGNOSIS — Z803 Family history of malignant neoplasm of breast: Secondary | ICD-10-CM

## 2024-01-15 DIAGNOSIS — E1143 Type 2 diabetes mellitus with diabetic autonomic (poly)neuropathy: Principal | ICD-10-CM | POA: Diagnosis present

## 2024-01-15 DIAGNOSIS — E1165 Type 2 diabetes mellitus with hyperglycemia: Secondary | ICD-10-CM | POA: Diagnosis present

## 2024-01-15 DIAGNOSIS — Z7984 Long term (current) use of oral hypoglycemic drugs: Secondary | ICD-10-CM

## 2024-01-15 DIAGNOSIS — K8050 Calculus of bile duct without cholangitis or cholecystitis without obstruction: Principal | ICD-10-CM

## 2024-01-15 DIAGNOSIS — Z7982 Long term (current) use of aspirin: Secondary | ICD-10-CM

## 2024-01-15 DIAGNOSIS — Z91199 Patient's noncompliance with other medical treatment and regimen due to unspecified reason: Secondary | ICD-10-CM

## 2024-01-15 DIAGNOSIS — N390 Urinary tract infection, site not specified: Secondary | ICD-10-CM | POA: Diagnosis present

## 2024-01-15 DIAGNOSIS — G8929 Other chronic pain: Secondary | ICD-10-CM | POA: Diagnosis present

## 2024-01-15 DIAGNOSIS — I10 Essential (primary) hypertension: Secondary | ICD-10-CM | POA: Diagnosis present

## 2024-01-15 DIAGNOSIS — E538 Deficiency of other specified B group vitamins: Secondary | ICD-10-CM | POA: Diagnosis present

## 2024-01-15 DIAGNOSIS — Z87891 Personal history of nicotine dependence: Secondary | ICD-10-CM

## 2024-01-15 DIAGNOSIS — I70203 Unspecified atherosclerosis of native arteries of extremities, bilateral legs: Secondary | ICD-10-CM | POA: Diagnosis present

## 2024-01-15 DIAGNOSIS — Z833 Family history of diabetes mellitus: Secondary | ICD-10-CM

## 2024-01-15 DIAGNOSIS — E119 Type 2 diabetes mellitus without complications: Secondary | ICD-10-CM

## 2024-01-15 DIAGNOSIS — Z66 Do not resuscitate: Secondary | ICD-10-CM | POA: Diagnosis present

## 2024-01-15 DIAGNOSIS — Z823 Family history of stroke: Secondary | ICD-10-CM

## 2024-01-15 DIAGNOSIS — Z79899 Other long term (current) drug therapy: Secondary | ICD-10-CM

## 2024-01-15 DIAGNOSIS — E785 Hyperlipidemia, unspecified: Secondary | ICD-10-CM | POA: Diagnosis present

## 2024-01-15 DIAGNOSIS — R109 Unspecified abdominal pain: Secondary | ICD-10-CM | POA: Diagnosis present

## 2024-01-15 DIAGNOSIS — E1142 Type 2 diabetes mellitus with diabetic polyneuropathy: Principal | ICD-10-CM | POA: Diagnosis present

## 2024-01-15 DIAGNOSIS — R1013 Epigastric pain: Secondary | ICD-10-CM

## 2024-01-15 DIAGNOSIS — Z8249 Family history of ischemic heart disease and other diseases of the circulatory system: Secondary | ICD-10-CM

## 2024-01-15 LAB — COMPREHENSIVE METABOLIC PANEL WITH GFR
ALT: 20 U/L (ref 0–44)
AST: 33 U/L (ref 15–41)
Albumin: 4.5 g/dL (ref 3.5–5.0)
Alkaline Phosphatase: 109 U/L (ref 38–126)
Anion gap: 17 — ABNORMAL HIGH (ref 5–15)
BUN: 14 mg/dL (ref 8–23)
CO2: 22 mmol/L (ref 22–32)
Calcium: 10.5 mg/dL — ABNORMAL HIGH (ref 8.9–10.3)
Chloride: 97 mmol/L — ABNORMAL LOW (ref 98–111)
Creatinine, Ser: 0.77 mg/dL (ref 0.44–1.00)
GFR, Estimated: >60 mL/min (ref >60)
Glucose, Bld: 145 mg/dL — ABNORMAL HIGH (ref 70–99)
Potassium: 4.7 mmol/L (ref 3.5–5.1)
Sodium: 136 mmol/L (ref 135–145)
Total Bilirubin: 0.8 mg/dL (ref 0.0–1.2)
Total Protein: 8.4 g/dL — ABNORMAL HIGH (ref 6.5–8.1)

## 2024-01-15 LAB — CBC
HCT: 41.4 % (ref 36.0–46.0)
Hemoglobin: 13.9 g/dL (ref 12.0–15.0)
MCH: 29.6 pg (ref 26.0–34.0)
MCHC: 33.6 g/dL (ref 30.0–36.0)
MCV: 88.1 fL (ref 80.0–100.0)
Platelets: 348 K/uL (ref 150–400)
RBC: 4.7 MIL/uL (ref 3.87–5.11)
RDW: 12.7 % (ref 11.5–15.5)
WBC: 7.2 K/uL (ref 4.0–10.5)
nRBC: 0 % (ref 0.0–0.2)

## 2024-01-15 LAB — CBG MONITORING, ED
Glucose-Capillary: 87 mg/dL (ref 70–99)
Glucose-Capillary: 66 mg/dL — ABNORMAL LOW (ref 70–99)
Glucose-Capillary: 141 mg/dL — ABNORMAL HIGH (ref 70–99)

## 2024-01-15 LAB — URINALYSIS, ROUTINE W REFLEX MICROSCOPIC
Bacteria, UA: NONE SEEN
Bilirubin Urine: NEGATIVE
Glucose, UA: >=500 mg/dL — AB
Hgb urine dipstick: NEGATIVE
Ketones, ur: 20 mg/dL — AB
Nitrite: NEGATIVE
Protein, ur: 100 mg/dL — AB
Specific Gravity, Urine: 1.026 (ref 1.005–1.030)
WBC, UA: >50 WBC/hpf (ref 0–5)
pH: 6 (ref 5.0–8.0)

## 2024-01-15 LAB — GLUCOSE, CAPILLARY: Glucose-Capillary: 166 mg/dL — ABNORMAL HIGH (ref 70–99)

## 2024-01-15 LAB — LIPASE, BLOOD: Lipase: 15 U/L (ref 11–51)

## 2024-01-15 MED ORDER — MORPHINE SULFATE (PF) 2 MG/ML IV SOLN
2.0000 mg | INTRAVENOUS | Status: DC | PRN
  Administered 2024-01-16 – 2024-01-19 (×3): 2 mg via INTRAVENOUS
  Filled 2024-01-15 (×3): qty 1

## 2024-01-15 MED ORDER — INSULIN ASPART 100 UNIT/ML IJ SOLN
0.0000 [IU] | Freq: Three times a day (TID) | INTRAMUSCULAR | Status: DC
  Administered 2024-01-18: 2 [IU] via SUBCUTANEOUS
  Administered 2024-01-19: 1 [IU] via SUBCUTANEOUS
  Administered 2024-01-19: 3 [IU] via SUBCUTANEOUS
  Administered 2024-01-19: 2 [IU] via SUBCUTANEOUS
  Filled 2024-01-15: qty 3
  Filled 2024-01-15: qty 2
  Filled 2024-01-15: qty 1
  Filled 2024-01-15: qty 2

## 2024-01-15 MED ORDER — ONDANSETRON 4 MG PO TBDP
4.0000 mg | ORAL_TABLET | Freq: Three times a day (TID) | ORAL | Status: DC | PRN
  Administered 2024-01-15 – 2024-01-20 (×5): 4 mg via ORAL
  Filled 2024-01-15 (×6): qty 1

## 2024-01-15 MED ORDER — SODIUM CHLORIDE 0.9 % IV BOLUS
1000.0000 mL | Freq: Once | INTRAVENOUS | Status: AC
  Administered 2024-01-15: 1000 mL via INTRAVENOUS

## 2024-01-15 MED ORDER — METOCLOPRAMIDE HCL 5 MG/ML IJ SOLN: 10.0000 mg | Freq: Three times a day (TID) | INTRAMUSCULAR | Status: DC | PRN

## 2024-01-15 MED ORDER — HYDROCODONE-ACETAMINOPHEN 5-325 MG PO TABS
1.0000 | ORAL_TABLET | Freq: Four times a day (QID) | ORAL | Status: DC | PRN
  Administered 2024-01-15 – 2024-01-20 (×7): 1 via ORAL
  Filled 2024-01-15 (×8): qty 1

## 2024-01-15 MED ORDER — FAMOTIDINE 20 MG PO TABS
20.0000 mg | ORAL_TABLET | Freq: Two times a day (BID) | ORAL | Status: DC
  Administered 2024-01-15 (×2): 20 mg via ORAL
  Filled 2024-01-15 (×2): qty 1

## 2024-01-15 MED ORDER — INSULIN ASPART 100 UNIT/ML IJ SOLN
0.0000 [IU] | Freq: Every day | INTRAMUSCULAR | Status: DC
  Administered 2024-01-17: 2 [IU] via SUBCUTANEOUS
  Administered 2024-01-18: 3 [IU] via SUBCUTANEOUS
  Filled 2024-01-15: qty 3
  Filled 2024-01-15: qty 2

## 2024-01-15 MED ORDER — ASPIRIN 81 MG PO TBEC
81.0000 mg | DELAYED_RELEASE_TABLET | Freq: Every day | ORAL | Status: DC
  Administered 2024-01-15 – 2024-01-20 (×5): 81 mg via ORAL
  Filled 2024-01-15 (×5): qty 1

## 2024-01-15 MED ORDER — MORPHINE SULFATE (PF) 4 MG/ML IV SOLN
4.0000 mg | Freq: Once | INTRAVENOUS | Status: DC
  Filled 2024-01-15: qty 1

## 2024-01-15 MED ORDER — METOCLOPRAMIDE HCL 5 MG/ML IJ SOLN
10.0000 mg | Freq: Three times a day (TID) | INTRAMUSCULAR | Status: DC
  Administered 2024-01-15 – 2024-01-18 (×9): 10 mg via INTRAVENOUS
  Filled 2024-01-15 (×10): qty 2

## 2024-01-15 MED ORDER — DEXTROSE 50 % IV SOLN: 12.5000 g | INTRAVENOUS | Status: AC

## 2024-01-15 MED ORDER — ACETAMINOPHEN 325 MG PO TABS
650.0000 mg | ORAL_TABLET | Freq: Four times a day (QID) | ORAL | Status: DC | PRN
  Administered 2024-01-20: 650 mg via ORAL
  Filled 2024-01-15: qty 2

## 2024-01-15 MED ORDER — ONDANSETRON HCL 4 MG/2ML IJ SOLN
4.0000 mg | Freq: Once | INTRAMUSCULAR | Status: AC
Start: 2024-01-15 — End: 2024-01-15
  Administered 2024-01-15: 4 mg via INTRAVENOUS
  Filled 2024-01-15: qty 2

## 2024-01-15 MED ORDER — ONDANSETRON HCL 4 MG/2ML IJ SOLN
4.0000 mg | Freq: Once | INTRAMUSCULAR | Status: AC
  Administered 2024-01-15: 4 mg via INTRAVENOUS
  Filled 2024-01-15: qty 2

## 2024-01-15 MED ORDER — HEPARIN SODIUM (PORCINE) 5000 UNIT/ML IJ SOLN
5000.0000 [IU] | Freq: Three times a day (TID) | INTRAMUSCULAR | Status: DC
  Administered 2024-01-15 – 2024-01-20 (×14): 5000 [IU] via SUBCUTANEOUS
  Filled 2024-01-15 (×15): qty 1

## 2024-01-15 MED ORDER — MORPHINE SULFATE (PF) 4 MG/ML IV SOLN
4.0000 mg | Freq: Once | INTRAVENOUS | Status: AC
  Administered 2024-01-15: 4 mg via INTRAVENOUS
  Filled 2024-01-15: qty 1

## 2024-01-15 MED ORDER — DEXTROSE 50 % IV SOLN
INTRAVENOUS | Status: AC
  Administered 2024-01-15: 12.5 g via INTRAVENOUS
  Filled 2024-01-15: qty 50

## 2024-01-15 NOTE — Consult Note (Signed)
 Subjective:   CC: Epigastric pain nausea vomiting  HPI:  Meredith Martinez is a 73 y.o. female who is consulted by Fernand for evaluation of above cc.  Symptoms were first noted several weeks ago.  Episodes of nausea and emesis accompanied by crampy epigastric pain.  She denies any changes in her bowel movements.  These episodes can last for several days was some relief in between but states the pain never completely resolves in between episodes.  Workup so far has been unremarkable except for gallstones noted.  Chart reviewed notes possible history of gastroparesis but patient and family members were not aware of this diagnosis.  No recent sick contacts, recent changes to the medication include switching from gabapentin  to pregabalin  for her chronic pain issues.  It is around the time her symptoms started, however, this latest episode of persistent nausea vomiting started even after stopping the pregabalin  for 3 days    Past Medical History:  has a past medical history of Diabetes mellitus without complication (HCC) and Stroke (HCC).  Past Surgical History:  has a past surgical history that includes Breast biopsy (Right) and Esophagogastroduodenoscopy (egd) with propofol  (N/A, 06/27/2017).  Family History: family history includes Arthritis in her mother; Breast cancer (age of onset: 69) in her mother; Cancer in her mother; Diabetes in her father and mother; Heart disease in her father and mother; Stroke in her father.  Social History:  reports that she quit smoking about 17 years ago. Her smoking use included cigarettes. She has never used smokeless tobacco. She reports that she does not drink alcohol and does not use drugs.  Current Medications:  Prior to Admission medications   Medication Sig Start Date End Date Taking? Authorizing Provider  traMADol  (ULTRAM ) 50 MG tablet Take 1/2-1 tablet by mouth twice daily as needed for pain. 01/10/24   McDonough, Tinnie POUR, PA-C  Accu-Chek Softclix Lancets  lancets Use to check blood glucose two to three times daily 07/06/23   Caleen Qualia, MD  aspirin  EC 81 MG tablet Take 1 tablet (81 mg total) by mouth daily. Swallow whole. 07/07/23   Amin, Sumayya, MD  atorvastatin  (LIPITOR) 80 MG tablet Take 1 tablet (80 mg total) by mouth daily. 07/07/23   Amin, Sumayya, MD  Blood Glucose Monitoring Suppl (ACCU-CHEK GUIDE) w/Device KIT Use to check blood glucose two to three times daily 07/06/23   Amin, Sumayya, MD  Cholecalciferol (VITAMIN D -3) 1000 units CAPS Take by mouth daily.    [provider]  Continuous Glucose Receiver (DEXCOM G7 RECEIVER) DEVI USE AS DIRECTED DX E11.65 12/02/23   McDonough, Tinnie POUR, PA-C  Continuous Glucose Sensor (DEXCOM G7 SENSOR) MISC USE AS DIRECTED FOR 10 DAYS 10/18/23   McDonough, Lauren K, PA-C  cyanocobalamin  1000 MCG tablet Take 1 tablet (1,000 mcg total) by mouth daily. 12/29/23   McDonough, Lauren K, PA-C  famotidine  (PEPCID ) 20 MG tablet TAKE 1 TABLET BY MOUTH EVERYDAY AT BEDTIME 01/10/24   McDonough, Lauren K, PA-C  glucose blood test strip Use to check blood sugar two to three times daily 07/06/23   Amin, Sumayya, MD  hydrochlorothiazide  (HYDRODIURIL ) 12.5 MG tablet TAKE 1 TABLET BY MOUTH EVERY DAY AS NEEDED FOR SWELLING 01/10/24   McDonough, Lauren K, PA-C  insulin  glargine (LANTUS ) 100 UNIT/ML Solostar Pen Inject 20 Units into the skin at bedtime. 07/06/23   Caleen Qualia, MD  Insulin  Pen Needle (PEN NEEDLES) 32G X 4 MM MISC Used to take insulin  injections 07/06/23   Amin, Sumayya, MD  JARDIANCE  25 MG TABS tablet Take 1 tablet (25 mg total) by mouth daily. 12/29/23   McDonough, Lauren K, PA-C  levocetirizine (XYZAL) 5 MG tablet Take 5 mg by mouth daily. 01/18/22   [provider]  Lidocaine  HCl-Benzyl Alcohol (SALONPAS LIDOCAINE  PLUS) 4-10 % CREA Apply 1 Application topically at bedtime.    [provider]  Lidocaine -Menthol (ICY HOT MAX LIDOCAINE ) 4-1 % CREA Apply 1 Application topically at bedtime.     [provider]  lisinopril  (ZESTRIL ) 10 MG tablet TAKE 1 TABLET BY MOUTH EVERY DAY 11/14/23   McDonough, Lauren K, PA-C  metFORMIN  (GLUCOPHAGE ) 500 MG tablet Take 2 tablets by mouth in AM 01/11/24   McDonough, Lauren K, PA-C  metoCLOPramide  (REGLAN ) 10 MG tablet Take 10 mg by mouth 4 (four) times daily. 01/18/22   [provider]  ondansetron  (ZOFRAN -ODT) 4 MG disintegrating tablet Take 1 tablet (4 mg total) by mouth every 8 (eight) hours as needed for nausea or vomiting. 12/25/23   Cleaster Tinnie LABOR, PA-C    Allergies:  Allergies as of 01/15/2024 - Review Complete 01/15/2024  Allergen Reaction Noted   Iodine Other (See Comments) 06/25/2017   Fish allergy Nausea And Vomiting and Other (See Comments) 07/06/2023   Amoxicillin Nausea And Vomiting 06/25/2017   Lyrica  [pregabalin ] Nausea And Vomiting 01/10/2024    ROS:  Pertinent positives and negatives noted in HPI   Objective:     BP (!) 150/77   Pulse 90   Temp 98.3 F (36.8 C) (Oral)   Resp 20   Ht 4' 11 (1.499 m)   Wt 56.1 kg   SpO2 100%   BMI 24.98 kg/m    Constitutional :  alert, cooperative, appears stated age, and no distress  Respiratory:  Clear to auscultation bilaterally  Cardiovascular:  Regular rate and rhythm  Gastrointestinal: Soft, no guarding, minimal tenderness to palpation epigastric region.  No pain right upper quadrant at time of exam.   Skin: Cool and moist  Psychiatric: Normal affect, non-agitated, not confused       LABS:     Latest Ref Rng & Units 01/15/2024    7:15 AM 01/13/2024    1:21 PM 12/25/2023    6:06 PM  CMP  Glucose 70 - 99 mg/dL 854  822  792   BUN 8 - 23 mg/dL 14  14  28    Creatinine 0.44 - 1.00 mg/dL 9.22  9.19  9.09   Sodium 135 - 145 mmol/L 136  137  133   Potassium 3.5 - 5.1 mmol/L 4.7  4.7  3.7   Chloride 98 - 111 mmol/L 97  98  95   CO2 22 - 32 mmol/L 22  21  25    Calcium  8.9 - 10.3 mg/dL 89.4  89.5  89.8   Total Protein 6.5 - 8.1 g/dL 8.4  7.6  9.1    Total Bilirubin 0.0 - 1.2 mg/dL 0.8  0.6  1.2   Alkaline Phos 38 - 126 U/L 109  115  106   AST 15 - 41 U/L 33  29  37   ALT 0 - 44 U/L 20  22  26        Latest Ref Rng & Units 01/15/2024    7:15 AM 01/13/2024    1:21 PM 12/25/2023    6:06 PM  CBC  WBC 4.0 - 10.5 K/uL 7.2  7.2  5.5   Hemoglobin 12.0 - 15.0 g/dL 86.0  85.9  85.0   Hematocrit  36.0 - 46.0 % 41.4  42.2  43.5   Platelets 150 - 400 K/uL 348  311  337      RADS: Narrative & Impression  EXAM: Right Upper Quadrant Abdominal Ultrasound 01/15/2024 08:14:37 AM   TECHNIQUE: Real-time ultrasonography of the right upper quadrant of the abdomen was performed.   COMPARISON: CT abdomen and pelvis 12/25/2023.   CLINICAL HISTORY: Biliary colic.   FINDINGS:   LIVER: The liver demonstrates normal echogenicity. No intrahepatic biliary ductal dilatation. No evidence of mass. The portal vein is patent with normal flow towards the liver.   BILIARY SYSTEM: Sludge is seen layering within the dependent portion of the gallbladder. Gallbladder wall thickness is within normal limits. No pericholecystic fluid or sonographic murphy sign. Common bile duct measures 5.5 mm without intrahepatic bile duct dilatation. No cholelithiasis.   OTHER: No right upper quadrant ascites.   IMPRESSION: 1. Gallbladder sludge without gallbladder wall thickening, pericholecystic fluid, or sonographic murphy sign. If there is high clinical concern for acute cholecystitis consider further evaluation with follow-up nuclear medicine hepatobiliary scan.   Electronically signed by: Waddell Calk MD 01/15/2024 08:23 AM EST RP Workstation: [id number]  EXAM: CT ABDOMEN AND PELVIS WITHOUT CONTRAST 12/25/2023 07:28:32 PM   TECHNIQUE: CT of the abdomen and pelvis was performed without the administration of intravenous contrast. Multiplanar reformatted images are provided for review. Automated exposure control, iterative reconstruction, and/or  weight-based adjustment of the mA/kV was utilized to reduce the radiation dose to as low as reasonably achievable.   COMPARISON: CT abdomen and pelvis 06/30/2023.   CLINICAL HISTORY: Abdominal pain, acute, nonlocalized.   FINDINGS:   LOWER CHEST: No acute abnormality.   LIVER: The liver is unremarkable.   GALLBLADDER AND BILE DUCTS: Layering hyperdensity within the gallbladder lumen. Myopic gallbladder. No gallbladder wall thickening or pericholecystic fluid. No biliary ductal dilatation.   SPLEEN: No acute abnormality.   PANCREAS: No acute abnormality.   ADRENAL GLANDS: No acute abnormality.   KIDNEYS, URETERS AND BLADDER: Punctate bilateral nephrolithiasis. No ureterolithiasis. No hydroureteronephrosis. No perinephric or periureteral stranding. Urinary bladder is unremarkable.   GI AND BOWEL: Stomach demonstrates no acute abnormality. No small or large bowel wall thickening. Few scattered colonic diverticula. The appendix is normal in caliber. There is no bowel obstruction.   PERITONEUM AND RETROPERITONEUM: No ascites. No free air.   VASCULATURE: Aorta is normal in caliber. Moderate-to-severe atherosclerotic plaque.   LYMPH NODES: No lymphadenopathy.   REPRODUCTIVE ORGANS: Atrophic uterus versus hysterectomy. No adnexal mass.   BONES AND SOFT TISSUES: No acute osseous abnormality. Tiny fat-containing umbilical hernia.   IMPRESSION: 1. No acute findings in the abdomen or pelvis. 2. Cholelithiasis with nonspecific hydropic gallbladder and no definite CT findings of acute cholecystitis. 3. Nonobstructive punctate bilateral nephrolithiasis. 4. Tiny fat-containing umbilical hernia.   Electronically signed by: Morgane Naveau MD 12/25/2023 07:37 PM EST RP Workstation: [id number]   Assessment:      Epigastric pain, nausea vomiting  Plan:    History not quite consistent with biliary colic based on the chronicity and duration of the symptoms.  Imaging  unremarkable so far except for stones as well.  With a history of medication changes and diabetes, as well as questionable gastroparesis and charts, will proceed with HIDA scan to see if any objective abnormalities can be documented with the gallbladder prior to considering surgery.  Recommend hospitalist consult admission for further workup for other possible etiologies for her symptoms.  labs/images/medications/previous chart entries reviewed personally and relevant changes/updates noted above.

## 2024-01-15 NOTE — ED Notes (Signed)
 Pt currently vomiting after having a couple bites of her dinner

## 2024-01-15 NOTE — ED Notes (Signed)
 Pt unable to provide a urine specimen at the current time.

## 2024-01-15 NOTE — ED Notes (Signed)
 US  at bedside.

## 2024-01-15 NOTE — Plan of Care (Signed)
   Problem: Coping: Goal: Ability to adjust to condition or change in health will improve Outcome: Progressing

## 2024-01-15 NOTE — ED Notes (Signed)
 Attempted to obtain blood work, unsuccessful, called lab and they advised they will send someone down.

## 2024-01-15 NOTE — H&P (Signed)
 History and Physical    Patient: Meredith Martinez FMW:[id number] DOB: 04-10-1950 DOA: 01/15/2024 DOS: the patient was seen and examined on 01/15/2024 PCP: Kristina Tinnie POUR, PA-C  Patient coming from: Home  Chief Complaint:  Chief Complaint  Patient presents with   Abdominal Pain   Emesis   HPI: Meredith Martinez is a 73 y.o. female with type 2 diabetes with peripheral neuropathy, hypertension, atherosclerosis of bilateral lower extremities, history of CVA, B12 deficiency, thyroid disorder, hyperlipidemia, who has had multiple presentations to the ED over the last month for epigastric pain. Patient initially presented on 11/2 with epigastric pain. CT was mostly unremarkable and patient passed p.o. challenge was discharged home.   Presented again to the ED today with abdominal pain, nausea, vomiting over the last two weeks with acute worsening in the last 3 to 4 days.   Pain primarily in the epigastrium but does radiate to right upper quadrant. She reports the pain is so bad that she cannot sleep. She endorses persistent bloating and fullness sensation. Right upper quadrant ultrasound in ED reveals cholelithiasis but no acute cholecystitis.  EDP discussed directly with general surgery who has evaluated the patient and recommended HIDA scan for now.  Patient is being admitted to [facility] for pain management and further workup.   Review of Systems: As mentioned in the history of present illness. All other systems reviewed and are negative. Past Medical History:  Diagnosis Date   Diabetes mellitus without complication (HCC)    niddm   Stroke [facility])    Past Surgical History:  Procedure Laterality Date   BREAST BIOPSY Right    neg   ESOPHAGOGASTRODUODENOSCOPY (EGD) WITH PROPOFOL  N/A 06/27/2017   Procedure: ESOPHAGOGASTRODUODENOSCOPY (EGD) WITH PROPOFOL ;  Surgeon: Therisa Bi, MD;  Location: [facility] ENDOSCOPY;  Service: Gastroenterology;  Laterality: N/A;   Social History:  reports that she quit  smoking about 17 years ago. Her smoking use included cigarettes. She has never used smokeless tobacco. She reports that she does not drink alcohol and does not use drugs.  Allergies  Allergen Reactions   Iodine Other (See Comments)    Convulsions    Fish Allergy Nausea And Vomiting and Other (See Comments)    Convulsions    Amoxicillin Nausea And Vomiting   Lyrica  [Pregabalin ] Nausea And Vomiting    Vomiting when taking 50mg  dose, 25mg  tolerated but ineffective    Family History  Problem Relation Age of Onset   Heart disease Mother    Diabetes Mother    Arthritis Mother    Cancer Mother    Breast cancer Mother 57   Heart disease Father    Stroke Father    Diabetes Father     Prior to Admission medications   Medication Sig Start Date End Date Taking? Authorizing Provider  traMADol  (ULTRAM ) 50 MG tablet Take 1/2-1 tablet by mouth twice daily as needed for pain. 01/10/24   McDonough, Tinnie POUR, PA-C  Accu-Chek Softclix Lancets lancets Use to check blood glucose two to three times daily 07/06/23   Caleen Qualia, MD  aspirin  EC 81 MG tablet Take 1 tablet (81 mg total) by mouth daily. Swallow whole. 07/07/23   Amin, Sumayya, MD  atorvastatin  (LIPITOR) 80 MG tablet Take 1 tablet (80 mg total) by mouth daily. 07/07/23   Amin, Sumayya, MD  Blood Glucose Monitoring Suppl (ACCU-CHEK GUIDE) w/Device KIT Use to check blood glucose two to three times daily 07/06/23   Amin, Sumayya, MD  Cholecalciferol (VITAMIN D -3) 1000 units CAPS Take by  mouth daily.    [provider]  Continuous Glucose Receiver (DEXCOM G7 RECEIVER) DEVI USE AS DIRECTED DX E11.65 12/02/23   McDonough, Tinnie POUR, PA-C  Continuous Glucose Sensor (DEXCOM G7 SENSOR) MISC USE AS DIRECTED FOR 10 DAYS 10/18/23   McDonough, Lauren K, PA-C  cyanocobalamin  1000 MCG tablet Take 1 tablet (1,000 mcg total) by mouth daily. 12/29/23   McDonough, Lauren K, PA-C  famotidine  (PEPCID ) 20 MG tablet TAKE 1 TABLET BY MOUTH EVERYDAY AT BEDTIME  01/10/24   McDonough, Lauren K, PA-C  glucose blood test strip Use to check blood sugar two to three times daily 07/06/23   Amin, Sumayya, MD  hydrochlorothiazide  (HYDRODIURIL ) 12.5 MG tablet TAKE 1 TABLET BY MOUTH EVERY DAY AS NEEDED FOR SWELLING 01/10/24   McDonough, Lauren K, PA-C  insulin  glargine (LANTUS ) 100 UNIT/ML Solostar Pen Inject 20 Units into the skin at bedtime. 07/06/23   Caleen Qualia, MD  Insulin  Pen Needle (PEN NEEDLES) 32G X 4 MM MISC Used to take insulin  injections 07/06/23   Amin, Sumayya, MD  JARDIANCE  25 MG TABS tablet Take 1 tablet (25 mg total) by mouth daily. 12/29/23   McDonough, Lauren K, PA-C  levocetirizine (XYZAL) 5 MG tablet Take 5 mg by mouth daily. 01/18/22   [provider]  Lidocaine  HCl-Benzyl Alcohol (SALONPAS LIDOCAINE  PLUS) 4-10 % CREA Apply 1 Application topically at bedtime.    [provider]  Lidocaine -Menthol (ICY HOT MAX LIDOCAINE ) 4-1 % CREA Apply 1 Application topically at bedtime.    [provider]  lisinopril  (ZESTRIL ) 10 MG tablet TAKE 1 TABLET BY MOUTH EVERY DAY 11/14/23   McDonough, Lauren K, PA-C  metFORMIN  (GLUCOPHAGE ) 500 MG tablet Take 2 tablets by mouth in AM 01/11/24   McDonough, Lauren K, PA-C  metoCLOPramide  (REGLAN ) 10 MG tablet Take 10 mg by mouth 4 (four) times daily. 01/18/22   [provider]  ondansetron  (ZOFRAN -ODT) 4 MG disintegrating tablet Take 1 tablet (4 mg total) by mouth every 8 (eight) hours as needed for nausea or vomiting. 12/25/23   Cleaster Tinnie LABOR, PA-C    Physical Exam: Vitals:   01/15/24 0623 01/15/24 0625 01/15/24 0800 01/15/24 0926  BP:  117/62 (!) 118/54 (!) 150/77  Pulse: 81  70 90  Resp: 17   20  Temp: 98.3 F (36.8 C)     TempSrc: Oral     SpO2: 100%  100% 100%  Weight:  56.1 kg    Height:  4' 11 (1.499 m)     Constitutional:  Normal appearance. Non toxic-appearing.  HENT: Head Normocephalic and atraumatic.  Mucous membranes are moist.  Eyes:  Extraocular intact.  Conjunctivae normal. Pupils are equal, round, and reactive to light.  Cardiovascular: Rate and Rhythm: Normal rate and regular rhythm.  Pulmonary: Non labored, symmetric rise of chest wall.  Abdominal: Tender to palpation in epigastrium, currently dry heaving. Musculoskeletal:  Normal range of motion.  Skin: warm and dry. not jaundiced.  Neurological: No focal deficit present. alert. Oriented. Psychiatric: Mood and Affect congruent.    Data Reviewed:    Latest Ref Rng & Units 01/15/2024    7:15 AM 01/13/2024    1:21 PM 12/25/2023    6:06 PM  CBC  WBC 4.0 - 10.5 K/uL 7.2  7.2  5.5   Hemoglobin 12.0 - 15.0 g/dL 86.0  85.9  85.0   Hematocrit 36.0 - 46.0 % 41.4  42.2  43.5   Platelets 150 - 400 K/uL 348  311  337  Latest Ref Rng & Units 01/15/2024    7:15 AM 01/13/2024    1:21 PM 12/25/2023    6:06 PM  BMP  Glucose 70 - 99 mg/dL 854  822  792   BUN 8 - 23 mg/dL 14  14  28    Creatinine 0.44 - 1.00 mg/dL 9.22  9.19  9.09   BUN/Creat Ratio 12 - 28  18    Sodium 135 - 145 mmol/L 136  137  133   Potassium 3.5 - 5.1 mmol/L 4.7  4.7  3.7   Chloride 98 - 111 mmol/L 97  98  95   CO2 22 - 32 mmol/L 22  21  25    Calcium  8.9 - 10.3 mg/dL 89.4  89.5  89.8    US  Abdomen Limited RUQ (LIVER/GB) EXAM: Right Upper Quadrant Abdominal Ultrasound 01/15/2024 08:14:37 AM  TECHNIQUE: Real-time ultrasonography of the right upper quadrant of the abdomen was performed.  COMPARISON: CT abdomen and pelvis 12/25/2023.  CLINICAL HISTORY: Biliary colic.  FINDINGS:  LIVER: The liver demonstrates normal echogenicity. No intrahepatic biliary ductal dilatation. No evidence of mass. The portal vein is patent with normal flow towards the liver.  BILIARY SYSTEM: Sludge is seen layering within the dependent portion of the gallbladder. Gallbladder wall thickness is within normal limits. No pericholecystic fluid or sonographic murphy sign. Common bile duct measures 5.5 mm without  intrahepatic bile duct dilatation. No cholelithiasis.  OTHER: No right upper quadrant ascites.  IMPRESSION: 1. Gallbladder sludge without gallbladder wall thickening, pericholecystic fluid, or sonographic murphy sign. If there is high clinical concern for acute cholecystitis consider further evaluation with follow-up nuclear medicine hepatobiliary scan.  Electronically signed by: Waddell Calk MD 01/15/2024 08:23 AM EST RP Workstation: [id number]    Assessment and Plan: Recurrent abdominal pain - With associated nausea vomiting. - CT 11/2 without clear pathology - Right upper quadrant ultrasound with cholelithiasis but no cholecystitis.  HIDA scan pending.  General surgery following - Suspect part of this may be secondary to diabetic gastroparesis. Will start reglan . -- Doubt pt can handle gastric emptying study given active vomiting at this time.   Type 2 diabetes with peripheral neuropathy, uncontrolled with hyperglycemia - Hemoglobin A1c 12.3% - Started on sliding scale with basal/bolus titration daily -- Diabetes education consult for diet education  Hypertension - Gradually resume home meds as BP tolerates.  Atherosclerosis of bilateral lower extremities - Continue home meds  History of CVA - Continue statin at DC  B12 deficiency - Continue supplementation  Hyperlipidemia  - Statin     Advance Care Planning: Erica Zappata, Niece should be decision maker if needed. She endorses desire to be DNR just let me go naturally   Consults: General Surgery  Family Communication: Niece at bedside for exam  Severity of Illness: The appropriate patient status for this patient is OBSERVATION. Observation status is judged to be reasonable and necessary in order to provide the required intensity of service to ensure the patient's safety. The patient's presenting symptoms, physical exam findings, and initial radiographic and laboratory data in the context of their medical  condition is felt to place them at decreased risk for further clinical deterioration. Furthermore, it is anticipated that the patient will be medically stable for discharge from the hospital within 2 midnights of admission.   Author: Nessie Nong, DO 01/15/2024 10:04 AM  For on call review [email or web address].

## 2024-01-15 NOTE — ED Provider Notes (Signed)
 [facility] Provider Note    Event Date/Time   First MD Initiated Contact with Patient 01/15/24 0725     (approximate)   History   Abdominal Pain and Emesis   HPI  Meredith Martinez is a 73 y.o. female who presents today with concern of abdominal pain.  Recent evaluation in the emergency department for similar symptoms.  Apparently at the time of discharge symptoms were much better but then about 1 or 2 days afterwards, continued with pain and has not been able to hold down any of her p.o. medications but she over the last 3 to 4 days.  Has not had fevers or chills but persistent nausea and vomiting and poor p.o. tolerance.  Pain is primarily in the epigastrium but also along the right upper quadrant of the abdomen.  Denies any abdominal surgical history.  I reviewed her recent ER evaluation as well as her recent CT imaging which demonstrated evidence of cholelithiasis without acute evidence of cholecystitis.      Physical Exam   Triage Vital Signs: ED Triage Vitals  Encounter Vitals Group     BP 01/15/24 0625 117/62     Girls Systolic BP Percentile --      Girls Diastolic BP Percentile --      Boys Systolic BP Percentile --      Boys Diastolic BP Percentile --      Pulse Rate 01/15/24 0623 81     Resp 01/15/24 0623 17     Temp 01/15/24 0623 98.3 F (36.8 C)     Temp Source 01/15/24 0623 Oral     SpO2 01/15/24 0623 100 %     Weight 01/15/24 0625 123 lb 10.9 oz (56.1 kg)     Height 01/15/24 0625 4' 11 (1.499 m)     Head Circumference --      Peak Flow --      Pain Score 01/15/24 0624 8     Pain Loc --      Pain Education --      Exclude from Growth Chart --     Most recent vital signs: Vitals:   01/15/24 1430 01/15/24 1500  BP: (!) 133/52 (!) 129/52  Pulse: 60 (!) 59  Resp:  18  Temp:  98.1 F (36.7 C)  SpO2: 98% 99%     General: Awake, appears uncomfortable CV:  Good peripheral perfusion.  Resp:  Normal effort.  Abd:  No  distention.  Soft with discomfort along the right upper quadrant to palpation Other:     ED Results / Procedures / Treatments   Labs (all labs ordered are listed, but only abnormal results are displayed) Labs Reviewed  COMPREHENSIVE METABOLIC PANEL WITH GFR - Abnormal; Notable for the following components:      Result Value   Chloride 97 (*)    Glucose, Bld 145 (*)    Calcium  10.5 (*)    Total Protein 8.4 (*)    Anion gap 17 (*)    All other components within normal limits  LIPASE, BLOOD  CBC  URINALYSIS, ROUTINE W REFLEX MICROSCOPIC  CBG MONITORING, ED     EKG     RADIOLOGY   PROCEDURES:  Critical Care performed: No  Procedures   MEDICATIONS ORDERED IN ED: Medications  morphine  (PF) 4 MG/ML injection 4 mg (4 mg Intravenous Patient Refused/Not Given 01/15/24 1046)  heparin  injection 5,000 Units (5,000 Units Subcutaneous Given 01/15/24 1308)  morphine  (PF) 2 MG/ML injection 2 mg (  has no administration in time range)  acetaminophen  (TYLENOL ) tablet 650 mg (has no administration in time range)  HYDROcodone -acetaminophen  (NORCO/VICODIN) 5-325 MG per tablet 1 tablet (has no administration in time range)  aspirin  EC tablet 81 mg (81 mg Oral Given 01/15/24 1119)  famotidine  (PEPCID ) tablet 20 mg (20 mg Oral Given 01/15/24 1119)  ondansetron  (ZOFRAN -ODT) disintegrating tablet 4 mg (has no administration in time range)  insulin  aspart (novoLOG ) injection 0-9 Units ( Subcutaneous Not Given 01/15/24 1122)  insulin  aspart (novoLOG ) injection 0-5 Units (has no administration in time range)  metoCLOPramide  (REGLAN ) injection 10 mg (10 mg Intravenous Given 01/15/24 1308)  morphine  (PF) 4 MG/ML injection 4 mg (4 mg Intravenous Given 01/15/24 0755)  ondansetron  (ZOFRAN ) injection 4 mg (4 mg Intravenous Given 01/15/24 0755)  sodium chloride  0.9 % bolus 1,000 mL (0 mLs Intravenous Stopped 01/15/24 1122)  ondansetron  (ZOFRAN ) injection 4 mg (4 mg Intravenous Given 01/15/24 0924)      IMPRESSION / MDM / ASSESSMENT AND PLAN / ED COURSE  I reviewed the triage vital signs and the nursing notes.                               Patient's presentation is most consistent with acute complicated illness / injury requiring diagnostic workup.  73 year old female who presents today with concern of upper abdominal pain that is been ongoing off-and-on over the last few weeks.  Seems to have worsened over the last few days.  Vitals here are fortunately reassuring, but the patient appears very clearly uncomfortable here.  We are attempting symptomatic management I suspect most likely biliary pathology versus possibly pancreatitis.  Will assess further with ultrasound imaging follow-up labs, and determine workup accordingly.   Clinical Course as of 01/15/24 1614  Sun Jan 15, 2024  [id number] Patient continues to be symptomatic despite morphine  and Zofran  here, ultrasound imaging demonstrates evidence of cholelithiasis but no acute cholecystitis.  Given her continued symptoms I will discuss with general surgery, but anticipate likely will need admission for pain control. [SK]  [phone] Spoke with Dr. Tye who will come evaluate the patient to determine if would be appropriate for surgical admission. [SK]  [id number] Again discussed with Dr. Tye from the surgical team, evaluated the patient, recommends HIDA scan, and admission to medicine service given unclear source and cause of the patient's symptoms and pain at this time. [SK]  [phone] Spoke with Dr. Dezzi who has agreed to evaluate the patient to determine course of further medical management at this time. [SK]    Clinical Course User Index [SK] Fernand Rossie HERO, MD     FINAL CLINICAL IMPRESSION(S) / ED DIAGNOSES   Final diagnoses:  Biliary colic     Rx / DC Orders   ED Discharge Orders     None        Note:  This document was prepared using Dragon voice recognition software and may include unintentional dictation errors.   Fernand Rossie HERO, MD 01/15/24 [phone]

## 2024-01-15 NOTE — ED Triage Notes (Signed)
 Pt here for N/V/epigastric pain that started approx 1 week ago (Was seen recently for stones here). Pt denies passing stones. Unable to keep PO meds down. Denies D/fever/CP/SOB. Pt appears uncomfortable. Hx of DM, neuropathy, and stroke.

## 2024-01-16 ENCOUNTER — Other Ambulatory Visit (HOSPITAL_COMMUNITY): Payer: Self-pay

## 2024-01-16 ENCOUNTER — Observation Stay

## 2024-01-16 ENCOUNTER — Inpatient Hospital Stay

## 2024-01-16 ENCOUNTER — Telehealth (HOSPITAL_COMMUNITY): Payer: Self-pay

## 2024-01-16 DIAGNOSIS — K3184 Gastroparesis: Secondary | ICD-10-CM | POA: Diagnosis present

## 2024-01-16 DIAGNOSIS — Z79899 Other long term (current) drug therapy: Secondary | ICD-10-CM | POA: Diagnosis not present

## 2024-01-16 DIAGNOSIS — Z794 Long term (current) use of insulin: Secondary | ICD-10-CM | POA: Diagnosis not present

## 2024-01-16 DIAGNOSIS — Z66 Do not resuscitate: Secondary | ICD-10-CM | POA: Diagnosis present

## 2024-01-16 DIAGNOSIS — Z880 Allergy status to penicillin: Secondary | ICD-10-CM | POA: Diagnosis not present

## 2024-01-16 DIAGNOSIS — R112 Nausea with vomiting, unspecified: Secondary | ICD-10-CM | POA: Diagnosis not present

## 2024-01-16 DIAGNOSIS — E785 Hyperlipidemia, unspecified: Secondary | ICD-10-CM | POA: Diagnosis present

## 2024-01-16 DIAGNOSIS — R1010 Upper abdominal pain, unspecified: Secondary | ICD-10-CM

## 2024-01-16 DIAGNOSIS — Z8673 Personal history of transient ischemic attack (TIA), and cerebral infarction without residual deficits: Secondary | ICD-10-CM | POA: Diagnosis not present

## 2024-01-16 DIAGNOSIS — E1143 Type 2 diabetes mellitus with diabetic autonomic (poly)neuropathy: Secondary | ICD-10-CM | POA: Diagnosis present

## 2024-01-16 DIAGNOSIS — N390 Urinary tract infection, site not specified: Secondary | ICD-10-CM | POA: Diagnosis present

## 2024-01-16 DIAGNOSIS — Z87891 Personal history of nicotine dependence: Secondary | ICD-10-CM | POA: Diagnosis not present

## 2024-01-16 DIAGNOSIS — E538 Deficiency of other specified B group vitamins: Secondary | ICD-10-CM | POA: Diagnosis present

## 2024-01-16 DIAGNOSIS — E1142 Type 2 diabetes mellitus with diabetic polyneuropathy: Secondary | ICD-10-CM | POA: Diagnosis present

## 2024-01-16 DIAGNOSIS — R1013 Epigastric pain: Secondary | ICD-10-CM | POA: Diagnosis not present

## 2024-01-16 DIAGNOSIS — I7102 Dissection of abdominal aorta: Secondary | ICD-10-CM | POA: Diagnosis present

## 2024-01-16 DIAGNOSIS — Z7984 Long term (current) use of oral hypoglycemic drugs: Secondary | ICD-10-CM | POA: Diagnosis not present

## 2024-01-16 DIAGNOSIS — I70203 Unspecified atherosclerosis of native arteries of extremities, bilateral legs: Secondary | ICD-10-CM | POA: Diagnosis present

## 2024-01-16 DIAGNOSIS — K8020 Calculus of gallbladder without cholecystitis without obstruction: Secondary | ICD-10-CM | POA: Diagnosis present

## 2024-01-16 DIAGNOSIS — Z7982 Long term (current) use of aspirin: Secondary | ICD-10-CM | POA: Diagnosis not present

## 2024-01-16 DIAGNOSIS — Z833 Family history of diabetes mellitus: Secondary | ICD-10-CM | POA: Diagnosis not present

## 2024-01-16 DIAGNOSIS — I10 Essential (primary) hypertension: Secondary | ICD-10-CM | POA: Diagnosis present

## 2024-01-16 DIAGNOSIS — E876 Hypokalemia: Secondary | ICD-10-CM | POA: Diagnosis present

## 2024-01-16 DIAGNOSIS — E1169 Type 2 diabetes mellitus with other specified complication: Secondary | ICD-10-CM | POA: Diagnosis not present

## 2024-01-16 DIAGNOSIS — E1165 Type 2 diabetes mellitus with hyperglycemia: Secondary | ICD-10-CM | POA: Diagnosis present

## 2024-01-16 DIAGNOSIS — Z8249 Family history of ischemic heart disease and other diseases of the circulatory system: Secondary | ICD-10-CM | POA: Diagnosis not present

## 2024-01-16 DIAGNOSIS — K8050 Calculus of bile duct without cholangitis or cholecystitis without obstruction: Secondary | ICD-10-CM | POA: Diagnosis present

## 2024-01-16 DIAGNOSIS — E1151 Type 2 diabetes mellitus with diabetic peripheral angiopathy without gangrene: Secondary | ICD-10-CM | POA: Diagnosis present

## 2024-01-16 DIAGNOSIS — I7143 Infrarenal abdominal aortic aneurysm, without rupture: Secondary | ICD-10-CM | POA: Diagnosis not present

## 2024-01-16 LAB — CBC
HCT: 37.6 % (ref 36.0–46.0)
Hemoglobin: 12.6 g/dL (ref 12.0–15.0)
MCH: 29.8 pg (ref 26.0–34.0)
MCHC: 33.5 g/dL (ref 30.0–36.0)
MCV: 88.9 fL (ref 80.0–100.0)
Platelets: 315 K/uL (ref 150–400)
RBC: 4.23 MIL/uL (ref 3.87–5.11)
RDW: 13 % (ref 11.5–15.5)
WBC: 5.5 K/uL (ref 4.0–10.5)
nRBC: 0 % (ref 0.0–0.2)

## 2024-01-16 LAB — COMPREHENSIVE METABOLIC PANEL WITH GFR
ALT: 20 U/L (ref 0–44)
AST: 29 U/L (ref 15–41)
Albumin: 4 g/dL (ref 3.5–5.0)
Alkaline Phosphatase: 91 U/L (ref 38–126)
Anion gap: 11 (ref 5–15)
BUN: 12 mg/dL (ref 8–23)
CO2: 24 mmol/L (ref 22–32)
Calcium: 9.3 mg/dL (ref 8.9–10.3)
Chloride: 101 mmol/L (ref 98–111)
Creatinine, Ser: 0.67 mg/dL (ref 0.44–1.00)
GFR, Estimated: >60 mL/min (ref >60)
Glucose, Bld: 87 mg/dL (ref 70–99)
Potassium: 3.8 mmol/L (ref 3.5–5.1)
Sodium: 136 mmol/L (ref 135–145)
Total Bilirubin: 0.6 mg/dL (ref 0.0–1.2)
Total Protein: 7.2 g/dL (ref 6.5–8.1)

## 2024-01-16 LAB — MAGNESIUM: Magnesium: 1.9 mg/dL (ref 1.7–2.4)

## 2024-01-16 LAB — GLUCOSE, CAPILLARY
Glucose-Capillary: 108 mg/dL — ABNORMAL HIGH (ref 70–99)
Glucose-Capillary: 79 mg/dL (ref 70–99)
Glucose-Capillary: 107 mg/dL — ABNORMAL HIGH (ref 70–99)
Glucose-Capillary: 133 mg/dL — ABNORMAL HIGH (ref 70–99)

## 2024-01-16 LAB — LACTIC ACID, PLASMA: Lactic Acid, Venous: 0.8 mmol/L (ref 0.5–1.9)

## 2024-01-16 LAB — PHOSPHORUS: Phosphorus: 2.8 mg/dL (ref 2.5–4.6)

## 2024-01-16 MED ORDER — HYDRALAZINE HCL 20 MG/ML IJ SOLN: 10.0000 mg | Freq: Four times a day (QID) | INTRAMUSCULAR | Status: DC | PRN

## 2024-01-16 MED ORDER — LISINOPRIL 10 MG PO TABS
10.0000 mg | ORAL_TABLET | Freq: Every day | ORAL | Status: DC
  Administered 2024-01-17: 10 mg via ORAL
  Filled 2024-01-16: qty 1

## 2024-01-16 MED ORDER — TECHNETIUM TC 99M MEBROFENIN IV KIT
5.0000 | PACK | Freq: Once | INTRAVENOUS | Status: AC | PRN
  Administered 2024-01-16: 5.25 via INTRAVENOUS

## 2024-01-16 MED ORDER — PANTOPRAZOLE SODIUM 40 MG IV SOLR
40.0000 mg | Freq: Two times a day (BID) | INTRAVENOUS | Status: AC
  Administered 2024-01-16 (×2): 40 mg via INTRAVENOUS
  Filled 2024-01-16 (×2): qty 10

## 2024-01-16 MED ORDER — IOHEXOL 350 MG/ML SOLN
100.0000 mL | Freq: Once | INTRAVENOUS | Status: AC | PRN
  Administered 2024-01-16: 100 mL via INTRAVENOUS

## 2024-01-16 MED ORDER — HYDRALAZINE HCL 50 MG PO TABS: 50.0000 mg | ORAL_TABLET | Freq: Four times a day (QID) | ORAL | Status: DC | PRN

## 2024-01-16 MED ORDER — PANTOPRAZOLE SODIUM 40 MG PO TBEC
40.0000 mg | DELAYED_RELEASE_TABLET | Freq: Every day | ORAL | Status: DC
  Administered 2024-01-17 – 2024-01-20 (×4): 40 mg via ORAL
  Filled 2024-01-16 (×4): qty 1

## 2024-01-16 MED ORDER — SODIUM CHLORIDE 0.9 % IV SOLN: INTRAVENOUS | Status: DC

## 2024-01-16 MED ORDER — DICYCLOMINE HCL 10 MG PO CAPS
10.0000 mg | ORAL_CAPSULE | Freq: Three times a day (TID) | ORAL | Status: DC
  Administered 2024-01-16 – 2024-01-20 (×14): 10 mg via ORAL
  Filled 2024-01-16 (×14): qty 1

## 2024-01-16 MED ORDER — SODIUM CHLORIDE 0.9 % IV SOLN
1.0000 g | Freq: Every day | INTRAVENOUS | Status: DC
  Administered 2024-01-16 – 2024-01-17 (×2): 1 g via INTRAVENOUS
  Filled 2024-01-16 (×2): qty 10

## 2024-01-16 NOTE — Care Management Obs Status (Signed)
 MEDICARE OBSERVATION STATUS NOTIFICATION   Patient Details  Name: Meredith Martinez MRN: [id number] Date of Birth: 13-Jan-1951   Medicare Observation Status Notification Given:  No Patient has been admitted    [name] 01/16/2024, 2:35 PM

## 2024-01-16 NOTE — Telephone Encounter (Signed)
 Pharmacy Patient Advocate Encounter  Insurance verification completed.    The patient is insured through [facility]. Patient has Medicare and is not eligible for a copay card, but may be able to apply for patient assistance or Medicare RX Payment Plan (Patient Must reach out to their plan, if eligible for payment plan), if available.    Ran test claim for Humalog 100unit/ml Kwikpen and the current 30 day co-pay is $0.   This test claim was processed through [employer]- copay amounts may vary at other pharmacies due to [employer], or as the patient moves through the different stages of their insurance plan.

## 2024-01-16 NOTE — Inpatient Diabetes Management (Addendum)
 Inpatient Diabetes Program Recommendations  AACE/ADA: New Consensus Statement on Inpatient Glycemic Control  Target Ranges:  Prepandial:   less than 140 mg/dL      Peak postprandial:   less than 180 mg/dL (1-2 hours)      Critically ill patients:  140 - 180 mg/dL    Latest Reference Range & Units 01/15/24 11:16 01/15/24 16:52 01/15/24 17:31 01/15/24 21:00  Glucose-Capillary 70 - 99 mg/dL 87 66 (L) 858 (H) 833 (H)    Latest Reference Range & Units 06/25/17 08:33 10/19/21 12:41 06/29/23 17:32 10/10/23 11:47 01/13/24 13:21  Hemoglobin A1C 4.8 - 5.6 % 12.2 (H) 12.5 (H) 14.0 (H) 12.7 ! 12.3 (H)    Review of Glycemic Control  Diabetes history: DM2 Outpatient Diabetes medications: Lantus  20 units at bedtime, Jardiance  25 mg dajily, Metformin  1000 mg QAM Current orders for Inpatient glycemic control: Novolog  0-9 units TID with meals, Novolog  0-5 units QHS  Inpatient Diabetes Program Recommendations:    HbgA1C:  A1C 12.3% on 01/13/24 indicating an average glucose of 306 mg/dl over the past 2-3 months.  Outpatient Rx: At time of discharge, please provide Rx for Humalog insulin  pens (#[id number]), glucose test strips (#[id number]) and insulin  pen needles (#[id number]).  NOTE: Spoke with patient and niece at bedside about diabetes and home regimen for diabetes control. Patient reports being followed by PCP for diabetes management and currently taking Lantus  20 units at bedtime, Metformin  1000 mg QAM, and Jardiance  25 mg daily as an outpatient for diabetes control. Patient's family give patient her medications. Patient has been sick over the past few days so she has not been able to keep any oral medications down. Patient's niece states that patient was still receiving the Lantus  every night. Patient has used CGM sensors in the past but she does not like them so family does finger sticks for glucose monitoring. CBG is usually done only once a day at night and glucose is usually upper 200's mg/dl. Niece reports that  patient is suppose to start having an aide come each day so the aide will be helping to check glucose.  Patient has never used a correction scale. Discussed how a correction scale is used and niece states understanding.  Discussed A1C results (12.3% on 01/13/24) and explained that current A1C indicates an average glucose of 306 mg/dl over the past 2-3 months. Discussed glucose and A1C goals. Discussed importance of checking CBGs and maintaining good CBG control to prevent long-term and short-term complications. Reviewed hypoglycemia, symptoms, along with treatment. Asked that patient or family reach out to patient's PCP if she has any issues at all with hypoglycemia. Asked that they keep a written log of glucose and insulin  taken and to take it to PCP visits. Patient has an appointment with PCP on December 4th.  Patient has Lantus  insulin  at home but will need Rx for test strips and insulin  pen needles.   Patient verbalized understanding of information discussed and reports no further questions at this time related to diabetes. Per [facility] pharmacy, patient's insurance covers Humalog insulin  pens ($0 copay).  Thanks, Earnie Gainer, RN, MSN, CDCES Diabetes Coordinator Inpatient Diabetes Program [phone] (Team Pager from 8am to 5pm)

## 2024-01-16 NOTE — Progress Notes (Signed)
 [facility]- General Surgery  SURGICAL PROGRESS NOTE  Hospital Day(s): 1.   Interval History:  This morning patient was still experiencing some abdominal discomfort associated with nausea, no vomiting. Scheduled to have HIDA scan this afternoon. NPO until HIDA scan.    Vital signs in last 24 hours: [min-max] current  Temp:  [98.1 F (36.7 C)-98.3 F (36.8 C)] 98.1 F (36.7 C) (11/24 0818) Pulse Rate:  [59-64] 64 (11/24 0818) Resp:  [16-19] 16 (11/24 0818) BP: (129-169)/(52-70) 148/62 (11/24 0818) SpO2:  [99 %-100 %] 100 % (11/24 0818)     Height: 4' 11 (149.9 cm) Weight: 56.1 kg BMI (Calculated): 24.97   Intake/Output last 2 shifts:  11/23 0701 - 11/24 0700 In: 1000 [IV Piggyback:1000] Out: -    Physical Exam:  Constitutional: alert, cooperative and no distress  Respiratory: breathing non-labored at rest  Cardiovascular: regular rate and sinus rhythm  Gastrointestinal: soft, generalized tenderness, and non-distended  Labs:     Latest Ref Rng & Units 01/16/2024    4:28 AM 01/15/2024    7:15 AM 01/13/2024    1:21 PM  CBC  WBC 4.0 - 10.5 K/uL 5.5  7.2  7.2   Hemoglobin 12.0 - 15.0 g/dL 87.3  86.0  85.9   Hematocrit 36.0 - 46.0 % 37.6  41.4  42.2   Platelets 150 - 400 K/uL 315  348  311       Latest Ref Rng & Units 01/16/2024    4:28 AM 01/15/2024    7:15 AM 01/13/2024    1:21 PM  CMP  Glucose 70 - 99 mg/dL 87  854  822   BUN 8 - 23 mg/dL 12  14  14    Creatinine 0.44 - 1.00 mg/dL 9.32  9.22  9.19   Sodium 135 - 145 mmol/L 136  136  137   Potassium 3.5 - 5.1 mmol/L 3.8  4.7  4.7   Chloride 98 - 111 mmol/L 101  97  98   CO2 22 - 32 mmol/L 24  22  21    Calcium  8.9 - 10.3 mg/dL 9.3  89.4  89.5   Total Protein 6.5 - 8.1 g/dL 7.2  8.4  7.6   Total Bilirubin 0.0 - 1.2 mg/dL 0.6  0.8  0.6   Alkaline Phos 38 - 126 U/L 91  109  115   AST 15 - 41 U/L 29  33  29   ALT 0 - 44 U/L 20  20  22      Imaging studies: No new pertinent imaging studies   Assessment/Plan:   73 y.o. female with epigastric pain, complicated by pertinent comorbidities including type 2 diabetes mellitus, hypertension, and hyperlipidemia.   - Stable vital signs, no fever and not tachycardic    - No leukocytosis, and normal LFTs, alkaline phos and total bili   - If HIDA scan comes back positive for acute cholecystitis, will discuss robotic assisted lap cholecystectomy in further detail. Consider diabetic gastroparesis in differential if scan is negative.  - Continue pain management     -- Arnika Larzelere Barrientos PA-C

## 2024-01-16 NOTE — Plan of Care (Signed)
   Problem: Education: Goal: Ability to describe self-care measures that may prevent or decrease complications (Diabetes Survival Skills Education) will improve Outcome: Progressing   Problem: Coping: Goal: Ability to adjust to condition or change in health will improve Outcome: Progressing   Problem: Fluid Volume: Goal: Ability to maintain a balanced intake and output will improve Outcome: Progressing

## 2024-01-16 NOTE — Progress Notes (Signed)
 [facility] Progress Note  Patient: Meredith Martinez    FMW:[id number]  DOA: 01/15/2024     Date of Service: the patient was seen and examined on 01/16/2024  Chief Complaint  Patient presents with   Abdominal Pain   Emesis   Brief hospital course: Meredith Martinez is a 73 y.o. female with type 2 diabetes with peripheral neuropathy, hypertension, atherosclerosis of bilateral lower extremities, history of CVA, B12 deficiency, thyroid disorder, hyperlipidemia, who has had multiple presentations to the ED over the last month for epigastric pain. Patient initially presented on 11/2 with epigastric pain. CT was mostly unremarkable and patient passed p.o. challenge was discharged home.   Presented again to the ED today with abdominal pain, nausea, vomiting over the last two weeks with acute worsening in the last 3 to 4 days.   Pain primarily in the epigastrium but does radiate to right upper quadrant. She reports the pain is so bad that she cannot sleep. She endorses persistent bloating and fullness sensation. Right upper quadrant ultrasound in ED reveals cholelithiasis but no acute cholecystitis.  EDP discussed directly with general surgery who has evaluated the patient and recommended HIDA scan for now.  Patient is being admitted to [facility] for pain management and further workup.   Assessment and Plan:  # Recurrent abdominal pain - With associated nausea and  vomiting. - CT 11/2 without clear pathology - Right upper quadrant ultrasound with cholelithiasis but no cholecystitis.   HIDA scan: Cystic and CBD are patent, GB EF 47% General surgery following - Suspect part of this may be secondary to diabetic gastroparesis.  Continue reglan . -- Will consider gastric emptying study once vomiting improves --Another differential could be acute mesenteric ischemia,  Follow CT angio a/p Started Bentyl  for symptomatic treatment Started PPI for GI prophylax Continue IV fluid for hydration   #  UTI UA positive, patient is complaining of dysuria Started ceftriaxone  Follow urine culture   # Type 2 diabetes with peripheral neuropathy, uncontrolled with hyperglycemia - Hemoglobin A1c 12.3% - Started on sliding scale with basal/bolus titration daily -- Diabetes education consult for diet education   # Hypertension - Gradually resume home meds as BP tolerates.   # Atherosclerosis of bilateral lower extremities - Continue home meds   # History of CVA - Continue statin at DC   # B12 deficiency - Continue supplementation   # Hyperlipidemia  - Statin   Body mass index is 24.98 kg/m.  Interventions:  Diet: NPO DVT Prophylaxis: Subcutaneous Heparin     Advance goals of care discussion: Full code  Family Communication: family was present at bedside, at the time of interview.  The pt provided permission to discuss medical plan with the family. Opportunity was given to ask question and all questions were answered satisfactorily.   Disposition:  Pt is from home, admitted with abdominal pain, still has intractable abdominal, which precludes a safe discharge. Discharge to home, when stable, may need few days to improve.  Subjective: No significant events overnight.  Patient is still having significant intractable abdominal pain. Denied any chest pain or palpitation, no shortness of breath  Physical Exam: General: NAD, lying comfortably Appear in no distress, affect appropriate Eyes: PERRLA ENT: Oral Mucosa Clear, moist  Neck: no JVD,  Cardiovascular: S1 and S2 Present, no Murmur,  Respiratory: good respiratory effort, Bilateral Air entry equal and Decreased, no Crackles, no wheezes Abdomen: BS present, Soft and upper abd tenderness,  Skin: no rashes Extremities: no Pedal edema, no calf tenderness Neurologic:  without any new focal findings Gait not checked due to patient safety concerns  Vitals:   01/15/24 2101 01/16/24 0608 01/16/24 0818 01/16/24 1510  BP: (!)  168/57 (!) 169/65 (!) 148/62 (!) 149/72  Pulse: 63 61 64 84  Resp: 17 19 16 20   Temp: 98.2 F (36.8 C) 98.3 F (36.8 C) 98.1 F (36.7 C)   TempSrc: Oral  Oral   SpO2: 100% 100% 100% 100%  Weight:      Height:        Intake/Output Summary (Last 24 hours) at 01/16/2024 1533 Last data filed at 01/16/2024 1009 Gross per 24 hour  Intake 240 ml  Output --  Net 240 ml   Filed Weights   01/15/24 0625  Weight: 56.1 kg    Data Reviewed: I have personally reviewed and interpreted daily labs, tele strips, imagings as discussed above. I reviewed all nursing notes, pharmacy notes, vitals, pertinent old records I have discussed plan of care as described above with RN and patient/family.  CBC: Recent Labs  Lab 01/13/24 1321 01/15/24 0715 01/16/24 0428  WBC 7.2 7.2 5.5  NEUTROABS 4.8  --   --   HGB 14.0 13.9 12.6  HCT 42.2 41.4 37.6  MCV 91 88.1 88.9  PLT 311 348 315   Basic Metabolic Panel: Recent Labs  Lab 01/13/24 1321 01/15/24 0715 01/16/24 0428  NA 137 136 136  K 4.7 4.7 3.8  CL 98 97* 101  CO2 21 22 24   GLUCOSE 177* 145* 87  BUN 14 14 12   CREATININE 0.80 0.77 0.67  CALCIUM  10.4* 10.5* 9.3  MG 1.9  --  1.9  PHOS  --   --  2.8    Studies: NM Hepato W/EF Result Date: 01/16/2024 CLINICAL DATA:  Abdominal pain, upper, chronic, assess gallbladder motility EXAM: NUCLEAR MEDICINE HEPATOBILIARY IMAGING WITH GALLBLADDER EF TECHNIQUE: Sequential images of the abdomen were obtained out to 60 minutes following intravenous administration of radiopharmaceutical. After oral ingestion of Ensure, gallbladder ejection fraction was determined. At 60 min, normal ejection fraction is greater than 33%. RADIOPHARMACEUTICALS:  5.25 mCi Tc-23m  Choletec  IV COMPARISON:  Abdominal ultrasound 01/15/2024. Abdominal CT 12/25/2023. FINDINGS: Technical note: Patient became nauseated and vomited after Ensure consumption. Prompt uptake and biliary excretion of activity by the liver is seen.  Gallbladder activity is visualized, consistent with patency of cystic duct. Biliary activity passes into small bowel, consistent with patent common bile duct. Calculated gallbladder ejection fraction is 47%. (Normal gallbladder ejection fraction with Ensure is greater than 33% and less than 80%.) IMPRESSION: 1. The cystic and common bile ducts are patent. 2. Gallbladder ejection fraction within normal limits (47%). Electronically Signed   By: Elsie Perone M.D.   On: 01/16/2024 15:23    Scheduled Meds:  aspirin  EC  81 mg Oral Daily   heparin   5,000 Units Subcutaneous Q8H   insulin  aspart  0-5 Units Subcutaneous QHS   insulin  aspart  0-9 Units Subcutaneous TID WC   lisinopril   10 mg Oral Daily   metoCLOPramide  (REGLAN ) injection  10 mg Intravenous Q8H    morphine  injection  4 mg Intravenous Once   [START ON 01/17/2024] pantoprazole   40 mg Oral Daily   pantoprazole  (PROTONIX ) IV  40 mg Intravenous BID   Continuous Infusions:  sodium chloride  100 mL/hr at 01/16/24 1045   cefTRIAXone  (ROCEPHIN )  IV 1 g (01/16/24 1110)   PRN Meds: acetaminophen , hydrALAZINE  **OR** hydrALAZINE , HYDROcodone -acetaminophen , morphine  injection, ondansetron   Time spent: 55 minutes  Author:  ELVAN KENNER MD [facility] 01/16/2024 3:33 PM  To reach On-call, see care teams to locate the attending and reach out to them via [email or web address]. If 7PM-7AM, please contact night-coverage If you still have difficulty reaching the attending provider, please page the [facility] (Director on Call) for [facility] on amion for assistance.

## 2024-01-16 NOTE — Progress Notes (Signed)
Patient transported to nuclear med.

## 2024-01-16 NOTE — Progress Notes (Signed)
 Patient returned from nuclear med. Order received from Dr Tye to resume carb/modified diet

## 2024-01-16 NOTE — Progress Notes (Signed)
 Order received from Dr Von for full liquid diet

## 2024-01-16 NOTE — TOC CM/SW Note (Signed)
 Transition of Care [facility]) - Inpatient Brief Assessment   Patient Details  Name: Meredith Martinez MRN: [id number] Date of Birth: 1950-05-03  Transition of Care [facility]) CM/SW Contact:    Corean ONEIDA Haddock, RN Phone Number: 01/16/2024, 10:14 AM   Clinical Narrative:    Transition of Care [facility]) Screening Note   Patient Details  Name: Meredith Martinez Date of Birth: 1950-06-03   Transition of Care [facility]) CM/SW Contact:    Corean ONEIDA Haddock, RN Phone Number: 01/16/2024, 10:14 AM    Transition of Care Department [facility]) has reviewed patient and no TOC needs have been identified at this time. . If new patient transition needs arise, please place a TOC consult.   Transition of Care Asessment: Insurance and Status: Insurance coverage has been reviewed       Prior/Current Home Services: No current home services Social Drivers of Health Review: SDOH reviewed no interventions necessary Readmission risk has been reviewed: No (obs status, no score generated) Transition of care needs: no transition of care needs at this time

## 2024-01-17 DIAGNOSIS — R1013 Epigastric pain: Secondary | ICD-10-CM

## 2024-01-17 DIAGNOSIS — I7143 Infrarenal abdominal aortic aneurysm, without rupture: Secondary | ICD-10-CM

## 2024-01-17 DIAGNOSIS — R112 Nausea with vomiting, unspecified: Secondary | ICD-10-CM

## 2024-01-17 LAB — URINE DRUG SCREEN
Amphetamines: NEGATIVE
Barbiturates: NEGATIVE
Benzodiazepines: NEGATIVE
Cocaine: NEGATIVE
Fentanyl: NEGATIVE
Methadone Scn, Ur: NEGATIVE
Opiates: POSITIVE — AB
Tetrahydrocannabinol: NEGATIVE

## 2024-01-17 LAB — CBC
HCT: 38.1 % (ref 36.0–46.0)
Hemoglobin: 12.8 g/dL (ref 12.0–15.0)
MCH: 29.5 pg (ref 26.0–34.0)
MCHC: 33.6 g/dL (ref 30.0–36.0)
MCV: 87.8 fL (ref 80.0–100.0)
Platelets: 324 K/uL (ref 150–400)
RBC: 4.34 MIL/uL (ref 3.87–5.11)
RDW: 12.7 % (ref 11.5–15.5)
WBC: 6.6 K/uL (ref 4.0–10.5)
nRBC: 0 % (ref 0.0–0.2)

## 2024-01-17 LAB — URINE CULTURE

## 2024-01-17 LAB — BASIC METABOLIC PANEL WITH GFR
Anion gap: 12 (ref 5–15)
BUN: 8 mg/dL (ref 8–23)
CO2: 22 mmol/L (ref 22–32)
Calcium: 9 mg/dL (ref 8.9–10.3)
Chloride: 103 mmol/L (ref 98–111)
Creatinine, Ser: 0.57 mg/dL (ref 0.44–1.00)
GFR, Estimated: >60 mL/min (ref >60)
Glucose, Bld: 95 mg/dL (ref 70–99)
Potassium: 3.6 mmol/L (ref 3.5–5.1)
Sodium: 136 mmol/L (ref 135–145)

## 2024-01-17 LAB — GLUCOSE, CAPILLARY
Glucose-Capillary: 93 mg/dL (ref 70–99)
Glucose-Capillary: 94 mg/dL (ref 70–99)
Glucose-Capillary: 222 mg/dL — ABNORMAL HIGH (ref 70–99)

## 2024-01-17 LAB — PHOSPHORUS: Phosphorus: 2.3 mg/dL — ABNORMAL LOW (ref 2.5–4.6)

## 2024-01-17 LAB — MAGNESIUM: Magnesium: 1.9 mg/dL (ref 1.7–2.4)

## 2024-01-17 MED ORDER — SODIUM CHLORIDE 0.9 % IV SOLN
1.0000 g | Freq: Every day | INTRAVENOUS | Status: AC
  Administered 2024-01-18: 1 g via INTRAVENOUS
  Filled 2024-01-17: qty 10

## 2024-01-17 MED ORDER — LISINOPRIL 20 MG PO TABS
20.0000 mg | ORAL_TABLET | Freq: Every day | ORAL | Status: DC
  Administered 2024-01-18 – 2024-01-20 (×3): 20 mg via ORAL
  Filled 2024-01-17 (×3): qty 1

## 2024-01-17 MED ORDER — K PHOS MONO-SOD PHOS DI & MONO 155-852-130 MG PO TABS
500.0000 mg | ORAL_TABLET | Freq: Four times a day (QID) | ORAL | Status: AC
  Administered 2024-01-17 (×3): 500 mg via ORAL
  Filled 2024-01-17 (×3): qty 2

## 2024-01-17 MED ORDER — SODIUM CHLORIDE 0.9 % IV SOLN: INTRAVENOUS | Status: DC

## 2024-01-17 NOTE — Inpatient Diabetes Management (Signed)
 Inpatient Diabetes Program Recommendations  AACE/ADA: New Consensus Statement on Inpatient Glycemic Control   Target Ranges:  Prepandial:   less than 140 mg/dL      Peak postprandial:   less than 180 mg/dL (1-2 hours)      Critically ill patients:  140 - 180 mg/dL    Latest Reference Range & Units 01/16/24 08:21 01/16/24 11:46 01/16/24 17:46 01/16/24 21:19 01/17/24 07:38  Glucose-Capillary 70 - 99 mg/dL 891 (H) 79 892 (H) 866 (H) 93   Review of Glycemic Control  Diabetes history: DM2 Outpatient Diabetes medications: Lantus  20 units at bedtime, Jardiance  25 mg dajily, Metformin  1000 mg QAM Current orders for Inpatient glycemic control: Novolog  0-9 units TID with meals, Novolog  0-5 units QHS   Inpatient Diabetes Program Recommendations:     HbgA1C:  A1C 12.3% on 01/13/24 indicating an average glucose of 306 mg/dl over the past 2-3 months.   Outpatient Rx: Patient's CBGs have running in the 200's at home and A1C is 12.3%.   At time of discharge, please provide Rx for Humalog insulin  pens (#[id number]), glucose test strips (#[id number]) and insulin  pen needles (#[id number]).   Thanks, Earnie Gainer, RN, MSN, CDCES Diabetes Coordinator Inpatient Diabetes Program [phone] (Team Pager from 8am to 5pm)

## 2024-01-17 NOTE — Consult Note (Signed)
 Hospital Consult    Reason for Consult:  Abdominal Aortic Dissection Requesting Physician:  Dr Elvan Sor MD  MRN #:  [id number]  History of Present Illness: This is a 73 y.o. female  with type 2 diabetes with peripheral neuropathy, hypertension, atherosclerosis of bilateral lower extremities, history of CVA, B12 deficiency, thyroid disorder, hyperlipidemia, who has had multiple presentations to the ED over the last month for epigastric pain. Patient initially presented on 11/2 with epigastric pain. CT was mostly unremarkable and patient passed p.o. challenge was discharged home.   Presented again to the ED today with abdominal pain, nausea, vomiting over the last two weeks with acute worsening in the last 3 to 4 days.   Pain primarily in the epigastrium but does radiate to right upper quadrant. She reports the pain is so bad that she cannot sleep. She endorses persistent bloating and fullness sensation. Right upper quadrant ultrasound in ED reveals cholelithiasis but no acute cholecystitis.  EDP discussed directly with general surgery who has evaluated the patient and recommended HIDA scan for now.  Patient is being admitted to [facility] for pain management and further workup.  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 Vascular surgery was consulted to evaluate.     Past Medical History:  Diagnosis Date   Diabetes mellitus without complication (HCC)    niddm   Stroke [facility])     Past Surgical History:  Procedure Laterality Date   BREAST BIOPSY Right    neg   ESOPHAGOGASTRODUODENOSCOPY (EGD) WITH PROPOFOL  N/A 06/27/2017   Procedure: ESOPHAGOGASTRODUODENOSCOPY (EGD) WITH PROPOFOL ;  Surgeon: Therisa Bi, MD;  Location: [facility] ENDOSCOPY;  Service: Gastroenterology;  Laterality: N/A;    Allergies  Allergen Reactions   Iodine Other (See Comments)    Convulsions    Fish Allergy Nausea And Vomiting  and Other (See Comments)    Convulsions    Amoxicillin Nausea And Vomiting   Lyrica  [Pregabalin ] Nausea And Vomiting    Vomiting when taking 50mg  dose, 25mg  tolerated but ineffective    Prior to Admission medications   Medication Sig Start Date End Date Taking? Authorizing Provider  aspirin  EC 81 MG tablet Take 1 tablet (81 mg total) by mouth daily. Swallow whole. 07/07/23  Yes Caleen Qualia, MD  atorvastatin  (LIPITOR) 80 MG tablet Take 1 tablet (80 mg total) by mouth daily. 07/07/23  Yes Amin, Sumayya, MD  Cholecalciferol (VITAMIN D -3) 1000 units CAPS Take by mouth daily.   Yes [provider]  cyanocobalamin  1000 MCG tablet Take 1 tablet (1,000 mcg total) by mouth daily. 12/29/23  Yes McDonough, Lauren K, PA-C  famotidine  (PEPCID ) 20 MG tablet TAKE 1 TABLET BY MOUTH EVERYDAY AT BEDTIME 01/10/24  Yes McDonough, Lauren K, PA-C  hydrochlorothiazide  (HYDRODIURIL ) 12.5 MG tablet TAKE 1 TABLET BY MOUTH EVERY DAY AS NEEDED FOR SWELLING 01/10/24  Yes McDonough, Lauren K, PA-C  insulin  glargine (LANTUS ) 100 UNIT/ML Solostar Pen Inject 20 Units into the skin at bedtime. 07/06/23  Yes Caleen Qualia, MD  JARDIANCE  25 MG TABS tablet Take 1 tablet (25 mg total) by mouth daily. 12/29/23  Yes McDonough, Lauren K, PA-C  levocetirizine (XYZAL) 5 MG tablet Take 5 mg by mouth daily. 01/18/22  Yes [provider]  Lidocaine  HCl-Benzyl Alcohol (SALONPAS LIDOCAINE  PLUS) 4-10 % CREA Apply 1 Application topically at bedtime.   Yes [provider]  Lidocaine -Menthol (ICY HOT MAX LIDOCAINE ) 4-1 % CREA Apply 1 Application topically  at bedtime.   Yes [provider]  lisinopril  (ZESTRIL ) 10 MG tablet TAKE 1 TABLET BY MOUTH EVERY DAY 11/14/23  Yes McDonough, Lauren K, PA-C  metFORMIN  (GLUCOPHAGE ) 500 MG tablet Take 2 tablets by mouth in AM 01/11/24  Yes McDonough, Lauren K, PA-C  ondansetron  (ZOFRAN -ODT) 4 MG disintegrating tablet Take 1 tablet (4 mg total) by mouth every 8 (eight) hours as  needed for nausea or vomiting. 12/25/23  Yes Dougherty, Lauren A, PA-C  traMADol  (ULTRAM ) 50 MG tablet Take 1/2-1 tablet by mouth twice daily as needed for pain. 01/10/24  Yes McDonough, Tinnie POUR, PA-C  Accu-Chek Softclix Lancets lancets Use to check blood glucose two to three times daily 07/06/23   Caleen Qualia, MD  Blood Glucose Monitoring Suppl (ACCU-CHEK GUIDE) w/Device KIT Use to check blood glucose two to three times daily 07/06/23   Caleen Qualia, MD  Continuous Glucose Receiver (DEXCOM G7 RECEIVER) DEVI USE AS DIRECTED DX E11.65 12/02/23   McDonough, Tinnie POUR, PA-C  Continuous Glucose Sensor (DEXCOM G7 SENSOR) MISC USE AS DIRECTED FOR 10 DAYS 10/18/23   McDonough, Tinnie K, PA-C  glucose blood test strip Use to check blood sugar two to three times daily 07/06/23   Caleen Qualia, MD  Insulin  Pen Needle (PEN NEEDLES) 32G X 4 MM MISC Used to take insulin  injections 07/06/23   Amin, Sumayya, MD  metoCLOPramide  (REGLAN ) 10 MG tablet Take 10 mg by mouth 4 (four) times daily. Patient not taking: Reported on 01/16/2024 01/18/22   [provider]    Social History   Socioeconomic History   Marital status: Single    Spouse name: Not on file   Number of children: Not on file   Years of education: Not on file   Highest education level: Not on file  Occupational History   Not on file  Tobacco Use   Smoking status: Former    Current packs/day: 0.00    Types: Cigarettes    Quit date: 2008    Years since quitting: 17.9   Smokeless tobacco: Never  Substance and Sexual Activity   Alcohol use: No   Drug use: No   Sexual activity: Not Currently  Other Topics Concern   Not on file  Social History Narrative   Not on file   Social Drivers of Health   Financial Resource Strain: Not on file  Food Insecurity: No Food Insecurity (01/15/2024)   Hunger Vital Sign    Worried About Running Out of Food in the Last Year: Never true    Ran Out of Food in the Last Year: Never true   Transportation Needs: No Transportation Needs (01/15/2024)   PRAPARE - Administrator, Civil Service (Medical): No    Lack of Transportation (Non-Medical): No  Physical Activity: Not on file  Stress: Not on file  Social Connections: Moderately Isolated (01/15/2024)   Social Connection and Isolation Panel    Frequency of Communication with Friends and Family: More than three times a week    Frequency of Social Gatherings with Friends and Family: More than three times a week    Attends Religious Services: 1 to 4 times per year    Active Member of [employer] or Organizations: No    Attends Banker Meetings: Never    Marital Status: Divorced  Catering Manager Violence: Not At Risk (01/15/2024)   Humiliation, Afraid, Rape, and Kick questionnaire    Fear of Current or Ex-Partner: No    Emotionally Abused: No  Physically Abused: No    Sexually Abused: No     Family History  Problem Relation Age of Onset   Heart disease Mother    Diabetes Mother    Arthritis Mother    Cancer Mother    Breast cancer Mother 44   Heart disease Father    Stroke Father    Diabetes Father     ROS: Otherwise negative unless mentioned in HPI  Physical Examination  Vitals:   01/16/24 2120 01/17/24 0443  BP: (!) 141/62 (!) 154/51  Pulse: 69 (!) 55  Resp: 16 16  Temp: 98.3 F (36.8 C) 98 F (36.7 C)  SpO2: 100% 98%   Body mass index is 24.98 kg/m.  General:  WDWN in NAD Gait: Not observed HENT: WNL, normocephalic Pulmonary: normal non-labored breathing, without Rales, rhonchi,  wheezing Cardiac: regular, without  Murmurs, rubs or gallops; without carotid bruits Abdomen: Positive bowel sounds, soft, Positive tenderness to the epigastric area /Non Distended, no masses Skin: without rashes Vascular Exam/Pulses: All extremities are warm to touch with Palpable pulses.  Extremities: without ischemic changes, without Gangrene , without cellulitis; without open wounds;   Musculoskeletal: no muscle wasting or atrophy  Neurologic: A&O X 3;  No focal weakness or paresthesias are detected; speech is fluent/normal Psychiatric:  The pt has Normal affect. Lymph:  Unremarkable  CBC    Component Value Date/Time   WBC 6.6 01/17/2024 0440   RBC 4.34 01/17/2024 0440   HGB 12.8 01/17/2024 0440   HGB 14.0 01/13/2024 1321   HCT 38.1 01/17/2024 0440   HCT 42.2 01/13/2024 1321   PLT 324 01/17/2024 0440   PLT 311 01/13/2024 1321   MCV 87.8 01/17/2024 0440   MCV 91 01/13/2024 1321   MCH 29.5 01/17/2024 0440   MCHC 33.6 01/17/2024 0440   RDW 12.7 01/17/2024 0440   RDW 12.5 01/13/2024 1321   LYMPHSABS 1.9 01/13/2024 1321   MONOABS 0.6 06/29/2023 1732   EOSABS 0.1 01/13/2024 1321   BASOSABS 0.1 01/13/2024 1321    BMET    Component Value Date/Time   NA 136 01/17/2024 0440   NA 137 01/13/2024 1321   K 3.6 01/17/2024 0440   CL 103 01/17/2024 0440   CO2 22 01/17/2024 0440   GLUCOSE 95 01/17/2024 0440   BUN 8 01/17/2024 0440   BUN 14 01/13/2024 1321   CREATININE 0.57 01/17/2024 0440   CALCIUM  9.0 01/17/2024 0440   GFRNONAA >60 01/17/2024 0440   GFRAA >60 11/04/2018 2035    COAGS: Lab Results  Component Value Date   INR 1.0 06/29/2023     Non-Invasive Vascular Imaging:    EXAM:01/16/2024 CTA ABDOMEN AND PELVIS WITHOUT AND WITH CONTRAST   TECHNIQUE: Multidetector CT imaging of the abdomen and pelvis was performed using the standard protocol during bolus administration of intravenous contrast. Multiplanar reconstructed images and MIPs were obtained and reviewed to evaluate the vascular anatomy.   RADIATION DOSE REDUCTION: This exam was performed according to the departmental dose-optimization program which includes automated exposure control, adjustment of the mA and/or kV according to patient size and/or use of iterative reconstruction technique.   CONTRAST:  OMNIPAQUE  IOHEXOL  350 MG/ML SOLN   COMPARISON:  CT abdomen and pelvis  12/25/2023   FINDINGS: VASCULAR   Aorta: Normal caliber aorta without aneurysm. There is questionable small peripheral focal dissection is seen in the infrarenal abdominal aorta measuring 8 mm in length. There is calcified and noncalcified atherosclerotic plaque throughout the aorta.   Celiac: Patent  without evidence of aneurysm, dissection, vasculitis or significant stenosis.   SMA: Patent without evidence of aneurysm, dissection, vasculitis or significant stenosis.   Renals: Both renal arteries are patent without evidence of aneurysm, dissection, vasculitis, fibromuscular dysplasia or significant stenosis.   IMA: Patent without evidence of aneurysm, dissection, vasculitis or significant stenosis.   Inflow: Patent without evidence of aneurysm, dissection, vasculitis or significant stenosis. There is calcified atherosclerotic disease.   Proximal Outflow: Bilateral common femoral and visualized portions of the superficial and profunda femoral arteries are patent without evidence of aneurysm, dissection, vasculitis or significant stenosis.   Veins: No obvious venous abnormality within the limitations of this arterial phase study. Superior mesenteric vein and portal vein appear patent.   Review of the MIP images confirms the above findings.   NON-VASCULAR   Lower chest: No acute abnormality.   Hepatobiliary: The gallbladder is dilated. Small gallstones are present. There is no surrounding inflammation or ductal dilatation. The liver is within normal limits.   Pancreas: Unremarkable. No pancreatic ductal dilatation or surrounding inflammatory changes.   Spleen: Normal in size without focal abnormality.   Adrenals/Urinary Tract: There some scarring in the superior pole of the left kidney. There is no hydronephrosis or perinephric stranding. The adrenal glands and bladder are within normal limits.   Stomach/Bowel: Stomach is within normal limits. Appendix  appears normal. No evidence of bowel wall thickening, distention, or inflammatory changes. There is sigmoid colon diverticulosis.   Lymphatic: No enlarged lymph nodes are seen.   Reproductive: Status post hysterectomy. No adnexal masses.   Other: No abdominal wall hernia or abnormality. No abdominopelvic ascites.   Musculoskeletal: No acute or significant osseous findings.   IMPRESSION: 1. No evidence for mesenteric ischemia. 2. Questionable small focal peripheral dissection in the infrarenal abdominal aorta. 3. Cholelithiasis with dilated gallbladder. No surrounding inflammation. 4. Colonic diverticulosis. 5. Aortic atherosclerosis.   Aortic Atherosclerosis (ICD10-I70.0).     Electronically Signed   By: Greig Pique M.D.   On: 01/16/2024 18:38  Statin:  Yes.   Beta Blocker:  No. Aspirin :  Yes.   ACEI:  Yes.   ARB:  No. CCB use:  No Other antiplatelets/anticoagulants:  No.    ASSESSMENT/PLAN: This is a 73 y.o. female initially presented on 11/2 with epigastric pain. CT was mostly unremarkable and patient passed p.o. challenge was discharged home. Presented again to the ED today with abdominal pain, nausea, vomiting over the last two weeks with acute worsening in the last 3 to 4 days.   Pain primarily in the epigastrium but does radiate to right upper quadrant. She reports the pain is so bad that she cannot sleep. She endorses persistent bloating and fullness sensation. Patient underwent right upper quadrant ultrasound in the ED showing no acute cholelithiasis. Patient then underwent a CTA of the abdomen and pelvis revealing a questionable small focal area of aortic dissection that was infrarenal.   I reviewed the imaging scan with Dr Selinda Gu MD and vascular surgery does not recommend any intervention or procedure at this time. 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 I do not see any flow  limitation nor do I see any aneurysmal degeneration. I recommend strict blood pressure control with systolic blood pressures no higher than 140. We will follow up outpatient in 4-6 months with an aortic duplex ultrasound for continued evaluation.    -I discussed the plan with Dr  Selinda Gu MD and he agrees with the plan.    Gwendlyn JONELLE Shank [facility] 01/17/2024 7:28 AM

## 2024-01-17 NOTE — Consult Note (Signed)
 [name], MD [facility]  [address] [place], [place] [address] Phone: [phone] Fax : [phone]  Consultation  Referring Provider:     Dr. Von Primary Care Physician:  Kristina Tinnie POUR, PA-C Primary Gastroenterologist: [facility]         Reason for Consultation:     Abdominal pain  Date of Admission:  01/15/2024 Date of Consultation:  01/17/2024         HPI:   Meredith Martinez is a 73 y.o. female who was admitted on the 23rd of this month with abdominal pain.  She had been in the ED with similar symptoms previously and continues to have the abdominal pain.  She had reported that she had not been able to keep down her food for 3 to 4 days prior to being admitted.  There was a thought that this may be colicky abdominal pain and surgery was called who did not think that this was a acute cholecystitis.  The patient had a HIDA scan that showed a normal gallbladder ejection fraction without any sign of obstruction.  The patient's CBC showed a consistently normal white cell count and hemoglobin and hematocrit. The patient does have cholelithiasis seen on imaging.  In addition to the abdominal pain there were patient reported nausea and vomiting over 2 weeks that had gotten worse over the last 3 to 4 days as mentioned above.  The pain was primarily in the epigastric area but went over to the right side of the abdomen.  She also reported to the admitting physician that she cannot sleep at night due to the pain.  The patient was also evaluated by vascular surgery for abdominal aortic dissection.  It was reported that the thought there was a small chronic dissection but did not believe this was the cause of the patient's abdominal pain. Patient reports that her abdominal pain is worse when she eats pills.  She also reports that the abdominal pain can be associated with movement at times.  Past Medical History:  Diagnosis Date   Diabetes mellitus without complication (HCC)     niddm   Stroke [facility])     Past Surgical History:  Procedure Laterality Date   BREAST BIOPSY Right    neg   ESOPHAGOGASTRODUODENOSCOPY (EGD) WITH PROPOFOL  N/A 06/27/2017   Procedure: ESOPHAGOGASTRODUODENOSCOPY (EGD) WITH PROPOFOL ;  Surgeon: Therisa Bi, MD;  Location: [facility] ENDOSCOPY;  Service: Gastroenterology;  Laterality: N/A;    Prior to Admission medications   Medication Sig Start Date End Date Taking? Authorizing Provider  aspirin  EC 81 MG tablet Take 1 tablet (81 mg total) by mouth daily. Swallow whole. 07/07/23  Yes Caleen Qualia, MD  atorvastatin  (LIPITOR) 80 MG tablet Take 1 tablet (80 mg total) by mouth daily. 07/07/23  Yes Amin, Sumayya, MD  Cholecalciferol (VITAMIN D -3) 1000 units CAPS Take by mouth daily.   Yes [provider]  cyanocobalamin  1000 MCG tablet Take 1 tablet (1,000 mcg total) by mouth daily. 12/29/23  Yes McDonough, Lauren K, PA-C  famotidine  (PEPCID ) 20 MG tablet TAKE 1 TABLET BY MOUTH EVERYDAY AT BEDTIME 01/10/24  Yes McDonough, Lauren K, PA-C  hydrochlorothiazide  (HYDRODIURIL ) 12.5 MG tablet TAKE 1 TABLET BY MOUTH EVERY DAY AS NEEDED FOR SWELLING 01/10/24  Yes McDonough, Lauren K, PA-C  insulin  glargine (LANTUS ) 100 UNIT/ML Solostar Pen Inject 20 Units into the skin at bedtime. 07/06/23  Yes Caleen Qualia, MD  JARDIANCE  25 MG TABS tablet Take 1 tablet (25 mg total)  by mouth daily. 12/29/23  Yes McDonough, Lauren K, PA-C  levocetirizine (XYZAL) 5 MG tablet Take 5 mg by mouth daily. 01/18/22  Yes [provider]  Lidocaine  HCl-Benzyl Alcohol (SALONPAS LIDOCAINE  PLUS) 4-10 % CREA Apply 1 Application topically at bedtime.   Yes [provider]  Lidocaine -Menthol (ICY HOT MAX LIDOCAINE ) 4-1 % CREA Apply 1 Application topically at bedtime.   Yes [provider]  lisinopril  (ZESTRIL ) 10 MG tablet TAKE 1 TABLET BY MOUTH EVERY DAY 11/14/23  Yes McDonough, Lauren K, PA-C  metFORMIN  (GLUCOPHAGE ) 500 MG tablet Take 2 tablets by mouth in AM  01/11/24  Yes McDonough, Lauren K, PA-C  ondansetron  (ZOFRAN -ODT) 4 MG disintegrating tablet Take 1 tablet (4 mg total) by mouth every 8 (eight) hours as needed for nausea or vomiting. 12/25/23  Yes Dougherty, Lauren A, PA-C  traMADol  (ULTRAM ) 50 MG tablet Take 1/2-1 tablet by mouth twice daily as needed for pain. 01/10/24  Yes McDonough, Tinnie POUR, PA-C  Accu-Chek Softclix Lancets lancets Use to check blood glucose two to three times daily 07/06/23   Caleen Qualia, MD  Blood Glucose Monitoring Suppl (ACCU-CHEK GUIDE) w/Device KIT Use to check blood glucose two to three times daily 07/06/23   Caleen Qualia, MD  Continuous Glucose Receiver (DEXCOM G7 RECEIVER) DEVI USE AS DIRECTED DX E11.65 12/02/23   McDonough, Tinnie POUR, PA-C  Continuous Glucose Sensor (DEXCOM G7 SENSOR) MISC USE AS DIRECTED FOR 10 DAYS 10/18/23   McDonough, Tinnie POUR, PA-C  glucose blood test strip Use to check blood sugar two to three times daily 07/06/23   Caleen Qualia, MD  Insulin  Pen Needle (PEN NEEDLES) 32G X 4 MM MISC Used to take insulin  injections 07/06/23   Caleen Qualia, MD    Family History  Problem Relation Age of Onset   Heart disease Mother    Diabetes Mother    Arthritis Mother    Cancer Mother    Breast cancer Mother 41   Heart disease Father    Stroke Father    Diabetes Father      Social History   Tobacco Use   Smoking status: Former    Current packs/day: 0.00    Types: Cigarettes    Quit date: 2008    Years since quitting: 17.9   Smokeless tobacco: Never  Substance Use Topics   Alcohol use: No   Drug use: No    Allergies as of 01/15/2024 - Review Complete 01/15/2024  Allergen Reaction Noted   Iodine Other (See Comments) 06/25/2017   Fish allergy Nausea And Vomiting and Other (See Comments) 07/06/2023   Amoxicillin Nausea And Vomiting 06/25/2017   Lyrica  [pregabalin ] Nausea And Vomiting 01/10/2024    Review of Systems:    All systems reviewed and negative except where noted in HPI.    Physical Exam:  Vital signs in last 24 hours: Temp:  [98 F (36.7 C)-98.3 F (36.8 C)] 98.3 F (36.8 C) (11/25 0742) Pulse Rate:  [55-84] 56 (11/25 0742) Resp:  [16-20] 16 (11/25 0742) BP: (141-162)/(51-108) 162/108 (11/25 0742) SpO2:  [98 %-100 %] 99 % (11/25 0742) Last BM Date : 01/14/24 General:   Pleasant, cooperative in NAD Head:  Normocephalic and atraumatic. Eyes:   No icterus.   Conjunctiva pink. PERRLA. Ears:  Normal auditory acuity. Neck:  Supple; no masses or thyroidomegaly Lungs: Respirations even and unlabored. Lungs clear to auscultation bilaterally.   No wheezes, crackles, or rhonchi.  Heart:  Regular rate and rhythm;  Without murmur, clicks, rubs  or gallops Abdomen:  Soft, nondistended, the abdomen was tender to 1 finger palpation while flexing the abdominal wall muscles.. Normal bowel sounds. No appreciable masses or hepatomegaly.  No rebound or guarding.  Rectal:  Not performed. Msk:  Symmetrical without gross deformities.    Extremities:  Without edema, cyanosis or clubbing. Neurologic:  Alert and oriented x3;  grossly normal neurologically. Skin:  Intact without significant lesions or rashes. Cervical Nodes:  No significant cervical adenopathy. Psych:  Alert and cooperative. Normal affect.  LAB RESULTS: Recent Labs    01/15/24 0715 01/16/24 0428 01/17/24 0440  WBC 7.2 5.5 6.6  HGB 13.9 12.6 12.8  HCT 41.4 37.6 38.1  PLT 348 315 324   BMET Recent Labs    01/15/24 0715 01/16/24 0428 01/17/24 0440  NA 136 136 136  K 4.7 3.8 3.6  CL 97* 101 103  CO2 22 24 22   GLUCOSE 145* 87 95  BUN 14 12 8   CREATININE 0.77 0.67 0.57  CALCIUM  10.5* 9.3 9.0   LFT Recent Labs    01/16/24 0428  PROT 7.2  ALBUMIN 4.0  AST 29  ALT 20  ALKPHOS 91  BILITOT 0.6   PT/INR No results for input(s): LABPROT, INR in the last 72 hours.  STUDIES: CT Angio Abd/Pel w/ and/or w/o Result Date: 01/16/2024 CLINICAL DATA:  Mesenteric ischemia. EXAM: CTA ABDOMEN AND  PELVIS WITHOUT AND WITH CONTRAST TECHNIQUE: Multidetector CT imaging of the abdomen and pelvis was performed using the standard protocol during bolus administration of intravenous contrast. Multiplanar reconstructed images and MIPs were obtained and reviewed to evaluate the vascular anatomy. RADIATION DOSE REDUCTION: This exam was performed according to the departmental dose-optimization program which includes automated exposure control, adjustment of the mA and/or kV according to patient size and/or use of iterative reconstruction technique. CONTRAST:  OMNIPAQUE  IOHEXOL  350 MG/ML SOLN COMPARISON:  CT abdomen and pelvis 12/25/2023 FINDINGS: VASCULAR Aorta: Normal caliber aorta without aneurysm. There is questionable small peripheral focal dissection is seen in the infrarenal abdominal aorta measuring 8 mm in length. There is calcified and noncalcified atherosclerotic plaque throughout the aorta. Celiac: Patent without evidence of aneurysm, dissection, vasculitis or significant stenosis. SMA: Patent without evidence of aneurysm, dissection, vasculitis or significant stenosis. Renals: Both renal arteries are patent without evidence of aneurysm, dissection, vasculitis, fibromuscular dysplasia or significant stenosis. IMA: Patent without evidence of aneurysm, dissection, vasculitis or significant stenosis. Inflow: Patent without evidence of aneurysm, dissection, vasculitis or significant stenosis. There is calcified atherosclerotic disease. Proximal Outflow: Bilateral common femoral and visualized portions of the superficial and profunda femoral arteries are patent without evidence of aneurysm, dissection, vasculitis or significant stenosis. Veins: No obvious venous abnormality within the limitations of this arterial phase study. Superior mesenteric vein and portal vein appear patent. Review of the MIP images confirms the above findings. NON-VASCULAR Lower chest: No acute abnormality. Hepatobiliary: The  gallbladder is dilated. Small gallstones are present. There is no surrounding inflammation or ductal dilatation. The liver is within normal limits. Pancreas: Unremarkable. No pancreatic ductal dilatation or surrounding inflammatory changes. Spleen: Normal in size without focal abnormality. Adrenals/Urinary Tract: There some scarring in the superior pole of the left kidney. There is no hydronephrosis or perinephric stranding. The adrenal glands and bladder are within normal limits. Stomach/Bowel: Stomach is within normal limits. Appendix appears normal. No evidence of bowel wall thickening, distention, or inflammatory changes. There is sigmoid colon diverticulosis. Lymphatic: No enlarged lymph nodes are seen. Reproductive: Status post hysterectomy. No adnexal masses. Other:  No abdominal wall hernia or abnormality. No abdominopelvic ascites. Musculoskeletal: No acute or significant osseous findings. IMPRESSION: 1. No evidence for mesenteric ischemia. 2. Questionable small focal peripheral dissection in the infrarenal abdominal aorta. 3. Cholelithiasis with dilated gallbladder. No surrounding inflammation. 4. Colonic diverticulosis. 5. Aortic atherosclerosis. Aortic Atherosclerosis (ICD10-I70.0). Electronically Signed   By: Greig Pique M.D.   On: 01/16/2024 18:38   NM Hepato W/EF Result Date: 01/16/2024 CLINICAL DATA:  Abdominal pain, upper, chronic, assess gallbladder motility EXAM: NUCLEAR MEDICINE HEPATOBILIARY IMAGING WITH GALLBLADDER EF TECHNIQUE: Sequential images of the abdomen were obtained out to 60 minutes following intravenous administration of radiopharmaceutical. After oral ingestion of Ensure, gallbladder ejection fraction was determined. At 60 min, normal ejection fraction is greater than 33%. RADIOPHARMACEUTICALS:  5.25 mCi Tc-65m  Choletec  IV COMPARISON:  Abdominal ultrasound 01/15/2024. Abdominal CT 12/25/2023. FINDINGS: Technical note: Patient became nauseated and vomited after Ensure  consumption. Prompt uptake and biliary excretion of activity by the liver is seen. Gallbladder activity is visualized, consistent with patency of cystic duct. Biliary activity passes into small bowel, consistent with patent common bile duct. Calculated gallbladder ejection fraction is 47%. (Normal gallbladder ejection fraction with Ensure is greater than 33% and less than 80%.) IMPRESSION: 1. The cystic and common bile ducts are patent. 2. Gallbladder ejection fraction within normal limits (47%). Electronically Signed   By: Elsie Perone M.D.   On: 01/16/2024 15:23      Impression / Plan:   Assessment: Principal Problem:   Abdominal pain Active Problems:   Infrarenal abdominal aortic aneurysm (AAA) without rupture   Valorie Mcgrory is a 73 y.o. y/o female with with abdominal discomfort that is likely related to her recurrent retching with a positive Carnett's sign.  The patient's pain is reproducible by flexing the abdominal wall muscles.  Because of the vomiting time.  I am now being consulted for consideration of an EGD due to the patient's nausea vomiting abdominal discomfort.  Plan:  The patient will be set up for a EGD for tomorrow.  The patient has been explained that the abdominal pain is musculoskeletal and she has also been explained the procedure including risk benefits and alternatives and agrees to proceeding with the EGD for tomorrow.  Thank you for involving me in the care of this patient.      LOS: 1 day   [name], MD, MD. NOLIA 01/17/2024, 2:47 PM,  Pager [phone] 7am-5pm  Check AMION for 5pm -7am coverage and on weekends   Note: This dictation was prepared with Dragon dictation along with smaller phrase technology. Any transcriptional errors that result from this process are unintentional.

## 2024-01-17 NOTE — Plan of Care (Signed)
  Problem: Education: Goal: Ability to describe self-care measures that may prevent or decrease complications (Diabetes Survival Skills Education) will improve Outcome: Progressing Goal: Individualized Educational Video(s) Outcome: Progressing   Problem: Coping: Goal: Ability to adjust to condition or change in health will improve Outcome: Progressing   Problem: Fluid Volume: Goal: Ability to maintain a balanced intake and output will improve Outcome: Progressing   Problem: Clinical Measurements: Goal: Ability to maintain clinical measurements within normal limits will improve Outcome: Progressing Goal: Respiratory complications will improve Outcome: Progressing Goal: Cardiovascular complication will be avoided Outcome: Progressing   Problem: Activity: Goal: Risk for activity intolerance will decrease Outcome: Progressing   Problem: Nutrition: Goal: Adequate nutrition will be maintained Outcome: Progressing   Problem: Elimination: Goal: Will not experience complications related to bowel motility Outcome: Progressing Goal: Will not experience complications related to urinary retention Outcome: Progressing   Problem: Pain Managment: Goal: General experience of comfort will improve and/or be controlled Outcome: Progressing   Problem: Safety: Goal: Ability to remain free from injury will improve Outcome: Progressing

## 2024-01-17 NOTE — Progress Notes (Signed)
 [facility] Progress Note  Patient: Meredith Martinez    FMW:[id number]  DOA: 01/15/2024     Date of Service: the patient was seen and examined on 01/17/2024  Chief Complaint  Patient presents with   Abdominal Pain   Emesis   Brief hospital course: Meredith Martinez is a 73 y.o. female with type 2 diabetes with peripheral neuropathy, hypertension, atherosclerosis of bilateral lower extremities, history of CVA, B12 deficiency, thyroid disorder, hyperlipidemia, who has had multiple presentations to the ED over the last month for epigastric pain. Patient initially presented on 11/2 with epigastric pain. CT was mostly unremarkable and patient passed p.o. challenge was discharged home.   Presented again to the ED today with abdominal pain, nausea, vomiting over the last two weeks with acute worsening in the last 3 to 4 days.   Pain primarily in the epigastrium but does radiate to right upper quadrant. She reports the pain is so bad that she cannot sleep. She endorses persistent bloating and fullness sensation. Right upper quadrant ultrasound in ED reveals cholelithiasis but no acute cholecystitis.  EDP discussed directly with general surgery who has evaluated the patient and recommended HIDA scan for now.  Patient is being admitted to [facility] for pain management and further workup.   Assessment and Plan:  # Recurrent abdominal pain - With associated nausea and  vomiting. - CT 11/2 without clear pathology - Right upper quadrant ultrasound with cholelithiasis but no cholecystitis.   HIDA scan: Cystic and CBD are patent, GB EF 47% General surgery recommended no surgical intervention - Suspect part of this may be secondary to diabetic gastroparesis.  Continue reglan . -- consider gastric emptying study as an outpatient, cannot be done as an inpatient. -- Thought about BeeGentle ischemia, lactic acid was normal, CT angio a/p negative for mesenteric ischemia.  Chronic abdominal aortic dissection.   Vascular surgery consulted, recommended follow-up as an outpatient up to 6 months.  No surgical intervention. Started Bentyl  for symptomatic treatment Started PPI for GI prophylax Continue IV fluid for hydration Continue soft diet 11/25 GI consulted for possible EGD to rule out PUD   # Hypophosphatemia, Phos repleted Monitor electrolytes daily and replete as needed.  # UTI UA positive, patient was complaining of dysuria Ceftriaxone  x 3 days, last dose on 11/26 Urine culture, growing multiple species suggested recollection   # Type 2 diabetes with peripheral neuropathy, uncontrolled with hyperglycemia - Hemoglobin A1c 12.3% - Started on sliding scale with basal/bolus titration daily -- Diabetes education consult for diet education   # Hypertension - Lisinopril  20 mg p.o. daily, increased dose due to high BP Monitor BP and titrate medications accordingly   # Atherosclerosis of bilateral lower extremities - Continue home meds   # History of CVA - Continue statin at DC   # B12 deficiency - Continue supplementation   # Hyperlipidemia  - Statin   Body mass index is 24.98 kg/m.  Interventions:  Diet: NPO DVT Prophylaxis: Subcutaneous Heparin     Advance goals of care discussion: Full code  Family Communication: family was present at bedside, at the time of interview.  The pt provided permission to discuss medical plan with the family. Opportunity was given to ask question and all questions were answered satisfactorily.   Disposition:  Pt is from home, admitted with abdominal pain, still has intractable abdominal, which precludes a safe discharge. Discharge to home, when stable, may need few days to improve.  Subjective: No significant events overnight.  Patient is having intractable pain in  the epigastric area and feels nauseous and still vomiting, unable to tolerate diet. Patient agreed with GI evaluation for possible EGD  Physical Exam: General: NAD, lying  comfortably Appear in no distress, affect appropriate Eyes: PERRLA ENT: Oral Mucosa Clear, moist  Neck: no JVD,  Cardiovascular: S1 and S2 Present, no Murmur,  Respiratory: good respiratory effort, Bilateral Air entry equal and Decreased, no Crackles, no wheezes Abdomen: BS present, Soft and upper abd tenderness Skin: no rashes Extremities: no Pedal edema, no calf tenderness Neurologic: without any new focal findings Gait not checked due to patient safety concerns  Vitals:   01/16/24 1510 01/16/24 2120 01/17/24 0443 01/17/24 0742  BP: (!) 149/72 (!) 141/62 (!) 154/51 (!) 162/108  Pulse: 84 69 (!) 55 (!) 56  Resp: 20 16 16 16   Temp: 98.3 F (36.8 C) 98.3 F (36.8 C) 98 F (36.7 C) 98.3 F (36.8 C)  TempSrc:  Oral Oral Oral  SpO2: 100% 100% 98% 99%  Weight:      Height:        Intake/Output Summary (Last 24 hours) at 01/17/2024 1530 Last data filed at 01/16/2024 1944 Gross per 24 hour  Intake 709.71 ml  Output --  Net 709.71 ml   Filed Weights   01/15/24 0625  Weight: 56.1 kg    Data Reviewed: I have personally reviewed and interpreted daily labs, tele strips, imagings as discussed above. I reviewed all nursing notes, pharmacy notes, vitals, pertinent old records I have discussed plan of care as described above with RN and patient/family.  CBC: Recent Labs  Lab 01/13/24 1321 01/15/24 0715 01/16/24 0428 01/17/24 0440  WBC 7.2 7.2 5.5 6.6  NEUTROABS 4.8  --   --   --   HGB 14.0 13.9 12.6 12.8  HCT 42.2 41.4 37.6 38.1  MCV 91 88.1 88.9 87.8  PLT 311 348 315 324   Basic Metabolic Panel: Recent Labs  Lab 01/13/24 1321 01/15/24 0715 01/16/24 0428 01/17/24 0440  NA 137 136 136 136  K 4.7 4.7 3.8 3.6  CL 98 97* 101 103  CO2 21 22 24 22   GLUCOSE 177* 145* 87 95  BUN 14 14 12 8   CREATININE 0.80 0.77 0.67 0.57  CALCIUM  10.4* 10.5* 9.3 9.0  MG 1.9  --  1.9 1.9  PHOS  --   --  2.8 2.3*    Studies: CT Angio Abd/Pel w/ and/or w/o Result Date:  01/16/2024 CLINICAL DATA:  Mesenteric ischemia. EXAM: CTA ABDOMEN AND PELVIS WITHOUT AND WITH CONTRAST TECHNIQUE: Multidetector CT imaging of the abdomen and pelvis was performed using the standard protocol during bolus administration of intravenous contrast. Multiplanar reconstructed images and MIPs were obtained and reviewed to evaluate the vascular anatomy. RADIATION DOSE REDUCTION: This exam was performed according to the departmental dose-optimization program which includes automated exposure control, adjustment of the mA and/or kV according to patient size and/or use of iterative reconstruction technique. CONTRAST:  OMNIPAQUE  IOHEXOL  350 MG/ML SOLN COMPARISON:  CT abdomen and pelvis 12/25/2023 FINDINGS: VASCULAR Aorta: Normal caliber aorta without aneurysm. There is questionable small peripheral focal dissection is seen in the infrarenal abdominal aorta measuring 8 mm in length. There is calcified and noncalcified atherosclerotic plaque throughout the aorta. Celiac: Patent without evidence of aneurysm, dissection, vasculitis or significant stenosis. SMA: Patent without evidence of aneurysm, dissection, vasculitis or significant stenosis. Renals: Both renal arteries are patent without evidence of aneurysm, dissection, vasculitis, fibromuscular dysplasia or significant stenosis. IMA: Patent without evidence of aneurysm, dissection,  vasculitis or significant stenosis. Inflow: Patent without evidence of aneurysm, dissection, vasculitis or significant stenosis. There is calcified atherosclerotic disease. Proximal Outflow: Bilateral common femoral and visualized portions of the superficial and profunda femoral arteries are patent without evidence of aneurysm, dissection, vasculitis or significant stenosis. Veins: No obvious venous abnormality within the limitations of this arterial phase study. Superior mesenteric vein and portal vein appear patent. Review of the MIP images confirms the above findings.  NON-VASCULAR Lower chest: No acute abnormality. Hepatobiliary: The gallbladder is dilated. Small gallstones are present. There is no surrounding inflammation or ductal dilatation. The liver is within normal limits. Pancreas: Unremarkable. No pancreatic ductal dilatation or surrounding inflammatory changes. Spleen: Normal in size without focal abnormality. Adrenals/Urinary Tract: There some scarring in the superior pole of the left kidney. There is no hydronephrosis or perinephric stranding. The adrenal glands and bladder are within normal limits. Stomach/Bowel: Stomach is within normal limits. Appendix appears normal. No evidence of bowel wall thickening, distention, or inflammatory changes. There is sigmoid colon diverticulosis. Lymphatic: No enlarged lymph nodes are seen. Reproductive: Status post hysterectomy. No adnexal masses. Other: No abdominal wall hernia or abnormality. No abdominopelvic ascites. Musculoskeletal: No acute or significant osseous findings. IMPRESSION: 1. No evidence for mesenteric ischemia. 2. Questionable small focal peripheral dissection in the infrarenal abdominal aorta. 3. Cholelithiasis with dilated gallbladder. No surrounding inflammation. 4. Colonic diverticulosis. 5. Aortic atherosclerosis. Aortic Atherosclerosis (ICD10-I70.0). Electronically Signed   By: Greig Pique M.D.   On: 01/16/2024 18:38    Scheduled Meds:  aspirin  EC  81 mg Oral Daily   dicyclomine   10 mg Oral TID AC & HS   heparin   5,000 Units Subcutaneous Q8H   insulin  aspart  0-5 Units Subcutaneous QHS   insulin  aspart  0-9 Units Subcutaneous TID WC   [START ON 01/18/2024] lisinopril   20 mg Oral Daily   metoCLOPramide  (REGLAN ) injection  10 mg Intravenous Q8H   pantoprazole   40 mg Oral Daily   phosphorus  500 mg Oral QID   Continuous Infusions:  sodium chloride  75 mL/hr at 01/17/24 1014   cefTRIAXone  (ROCEPHIN )  IV 1 g (01/17/24 0815)   PRN Meds: acetaminophen , hydrALAZINE  **OR** hydrALAZINE ,  HYDROcodone -acetaminophen , morphine  injection, ondansetron   Time spent: 55 minutes  Author: ELVAN SOR. MD [facility] 01/17/2024 3:30 PM  To reach On-call, see care teams to locate the attending and reach out to them via [email or web address]. If 7PM-7AM, please contact night-coverage If you still have difficulty reaching the attending provider, please page the [facility] (Director on Call) for [facility] on amion for assistance.

## 2024-01-18 ENCOUNTER — Inpatient Hospital Stay: Admitting: Anesthesiology

## 2024-01-18 ENCOUNTER — Encounter
Admission: EM | Disposition: A | Discharge status: Home / Self Care | Payer: Self-pay | Source: Home / Self Care | Attending: Internal Medicine

## 2024-01-18 ENCOUNTER — Encounter: Payer: Self-pay | Admitting: Family Medicine

## 2024-01-18 DIAGNOSIS — E119 Type 2 diabetes mellitus without complications: Secondary | ICD-10-CM

## 2024-01-18 DIAGNOSIS — E1169 Type 2 diabetes mellitus with other specified complication: Secondary | ICD-10-CM | POA: Diagnosis not present

## 2024-01-18 DIAGNOSIS — R1013 Epigastric pain: Secondary | ICD-10-CM | POA: Diagnosis not present

## 2024-01-18 DIAGNOSIS — Z794 Long term (current) use of insulin: Secondary | ICD-10-CM

## 2024-01-18 DIAGNOSIS — I7143 Infrarenal abdominal aortic aneurysm, without rupture: Secondary | ICD-10-CM | POA: Diagnosis not present

## 2024-01-18 HISTORY — PX: ESOPHAGOGASTRODUODENOSCOPY: SHX5428

## 2024-01-18 LAB — CBC
HCT: 33.8 % — ABNORMAL LOW (ref 36.0–46.0)
Hemoglobin: 11.4 g/dL — ABNORMAL LOW (ref 12.0–15.0)
MCH: 29.5 pg (ref 26.0–34.0)
MCHC: 33.7 g/dL (ref 30.0–36.0)
MCV: 87.6 fL (ref 80.0–100.0)
Platelets: 284 K/uL (ref 150–400)
RBC: 3.86 MIL/uL — ABNORMAL LOW (ref 3.87–5.11)
RDW: 12.9 % (ref 11.5–15.5)
WBC: 5 K/uL (ref 4.0–10.5)
nRBC: 0 % (ref 0.0–0.2)

## 2024-01-18 LAB — BASIC METABOLIC PANEL WITH GFR
Anion gap: 10 (ref 5–15)
BUN: 7 mg/dL — ABNORMAL LOW (ref 8–23)
CO2: 25 mmol/L (ref 22–32)
Calcium: 8.5 mg/dL — ABNORMAL LOW (ref 8.9–10.3)
Chloride: 103 mmol/L (ref 98–111)
Creatinine, Ser: 0.62 mg/dL (ref 0.44–1.00)
GFR, Estimated: >60 mL/min (ref >60)
Glucose, Bld: 116 mg/dL — ABNORMAL HIGH (ref 70–99)
Potassium: 2.9 mmol/L — ABNORMAL LOW (ref 3.5–5.1)
Sodium: 138 mmol/L (ref 135–145)

## 2024-01-18 LAB — GLUCOSE, CAPILLARY
Glucose-Capillary: 104 mg/dL — ABNORMAL HIGH (ref 70–99)
Glucose-Capillary: 120 mg/dL — ABNORMAL HIGH (ref 70–99)
Glucose-Capillary: 196 mg/dL — ABNORMAL HIGH (ref 70–99)
Glucose-Capillary: 256 mg/dL — ABNORMAL HIGH (ref 70–99)

## 2024-01-18 LAB — MAGNESIUM: Magnesium: 1.8 mg/dL (ref 1.7–2.4)

## 2024-01-18 LAB — PHOSPHORUS: Phosphorus: 3.2 mg/dL (ref 2.5–4.6)

## 2024-01-18 LAB — POTASSIUM: Potassium: 3.2 mmol/L — ABNORMAL LOW (ref 3.5–5.1)

## 2024-01-18 SURGERY — EGD (ESOPHAGOGASTRODUODENOSCOPY)
Anesthesia: General

## 2024-01-18 MED ORDER — POTASSIUM CHLORIDE CRYS ER 20 MEQ PO TBCR
40.0000 meq | EXTENDED_RELEASE_TABLET | Freq: Once | ORAL | Status: AC
  Administered 2024-01-18: 40 meq via ORAL
  Filled 2024-01-18: qty 2

## 2024-01-18 MED ORDER — LIDOCAINE HCL (CARDIAC) PF 100 MG/5ML IV SOSY
PREFILLED_SYRINGE | INTRAVENOUS | Status: DC | PRN
  Administered 2024-01-18: 50 mg via INTRAVENOUS

## 2024-01-18 MED ORDER — PROPOFOL 10 MG/ML IV BOLUS
INTRAVENOUS | Status: DC | PRN
  Administered 2024-01-18: 90 mg via INTRAVENOUS

## 2024-01-18 MED ORDER — SODIUM CHLORIDE 0.9 % IV SOLN: INTRAVENOUS | Status: DC

## 2024-01-18 MED ORDER — MAGNESIUM SULFATE IN D5W 1-5 GM/100ML-% IV SOLN
1.0000 g | Freq: Once | INTRAVENOUS | Status: AC
  Administered 2024-01-18: 1 g via INTRAVENOUS
  Filled 2024-01-18: qty 100

## 2024-01-18 MED ORDER — METOCLOPRAMIDE HCL 5 MG PO TABS
5.0000 mg | ORAL_TABLET | Freq: Three times a day (TID) | ORAL | Status: DC
  Administered 2024-01-18 – 2024-01-20 (×5): 5 mg via ORAL
  Filled 2024-01-18 (×7): qty 1

## 2024-01-18 MED ORDER — POTASSIUM CHLORIDE 10 MEQ/100ML IV SOLN
10.0000 meq | INTRAVENOUS | Status: AC
  Administered 2024-01-18 (×2): 10 meq via INTRAVENOUS
  Filled 2024-01-18 (×2): qty 100

## 2024-01-18 MED ORDER — HYDRALAZINE HCL 50 MG PO TABS
50.0000 mg | ORAL_TABLET | Freq: Three times a day (TID) | ORAL | Status: DC
  Administered 2024-01-18 – 2024-01-20 (×7): 50 mg via ORAL
  Filled 2024-01-18 (×7): qty 1

## 2024-01-18 MED ORDER — HYDRALAZINE HCL 20 MG/ML IJ SOLN: 10.0000 mg | Freq: Three times a day (TID) | INTRAMUSCULAR | Status: DC

## 2024-01-18 NOTE — Progress Notes (Signed)
 The patient is status post EGD without any abnormalities found in the EGD to explain her abdominal pain or nausea and vomiting.  Her abdominal pain on physical exam is clearly musculoskeletal and likely due to her recurrent nausea and vomiting prior to admission.  The patient states that she has not had any further nausea vomiting since yesterday.  Nothing further to do from GI point of view at this time.  I will sign off.  Please call if any further GI concerns or questions.  We would like to thank you for the opportunity to participate in the care of Meredith Martinez.

## 2024-01-18 NOTE — Op Note (Signed)
 [facility] Gastroenterology Patient Name: Meredith Martinez Procedure Date: 01/18/2024 9:46 AM MRN: [id number] Account #: [id number] Date of Birth: [birth year] Admit Type: Inpatient Age: 73 Room: [facility] ENDO ROOM 4 Gender: Female Note Status: Finalized Instrument Name: Upper GI Scope [id number] Procedure:             Upper GI endoscopy Indications:           Nausea with vomiting Providers:             [name] MD, MD Referring MD:          Tinnie LOIS Pro (Referring MD) Medicines:             Propofol  per Anesthesia Complications:         No immediate complications. Procedure:             Pre-Anesthesia Assessment:                        - Prior to the procedure, a History and Physical was                         performed, and patient medications and allergies were                         reviewed. The patient's tolerance of previous                         anesthesia was also reviewed. The risks and benefits                         of the procedure and the sedation options and risks                         were discussed with the patient. All questions were                         answered, and informed consent was obtained. Prior                         Anticoagulants: The patient has taken no anticoagulant                         or antiplatelet agents. ASA Grade Assessment: II - A                         patient with mild systemic disease. After reviewing                         the risks and benefits, the patient was deemed in                         satisfactory condition to undergo the procedure.                        After obtaining informed consent, the endoscope was                         passed under direct vision. Throughout the procedure,  the patient's blood pressure, pulse, and oxygen                         saturations were monitored continuously. The Endoscope                         was introduced through the mouth,  and advanced to the                         second part of duodenum. The upper GI endoscopy was                         accomplished without difficulty. The patient tolerated                         the procedure well. Findings:      The examined esophagus was normal.      The entire examined stomach was normal. Biopsies were taken with a cold       forceps for histology.      The examined duodenum was normal. Impression:            - Normal esophagus.                        - Normal stomach. Biopsied.                        - Normal examined duodenum. Recommendation:        - Return patient to hospital ward for ongoing care.                        - Resume regular diet.                        - Continue present medications.                        - Await pathology results.                        - Consider other causes of the patients nausea and                         vomiting with normal EGD. Procedure Code(s):     --- Professional ---                        [phone], Esophagogastroduodenoscopy, flexible,                         transoral; with biopsy, single or multiple Diagnosis Code(s):     --- Professional ---                        R11.2, Nausea with vomiting, unspecified CPT copyright 2022 American Medical Association. All rights reserved. The codes documented in this report are preliminary and upon coder review may  be revised to meet current compliance requirements. [name] MD, MD 01/18/2024 10:08:14 AM This report has been signed electronically. Number of Addenda: 0 Note Initiated On: 01/18/2024 9:46 AM Estimated Blood Loss:  Estimated blood loss: none.  [facility]

## 2024-01-18 NOTE — Progress Notes (Addendum)
 PHARMACY CONSULT NOTE - ELECTROLYTES  Pharmacy Consult for Electrolyte Monitoring and Replacement   Recent Labs: Height: 4' 11 (149.9 cm) Weight: 56.1 kg (123 lb 10.9 oz) IBW/kg (Calculated) : 43.2 Estimated Creatinine Clearance: 49.3 mL/min (by C-G formula based on SCr of 0.62 mg/dL). Potassium (mmol/L)  Date Value  01/18/2024 2.9 (L)   Magnesium  (mg/dL)  Date Value  88/73/7974 1.8   Calcium  (mg/dL)  Date Value  88/73/7974 8.5 (L)   Albumin (g/dL)  Date Value  88/75/7974 4.0  01/13/2024 4.3   Phosphorus (mg/dL)  Date Value  88/73/7974 3.2   Sodium (mmol/L)  Date Value  01/18/2024 138  01/13/2024 137    Assessment  Meredith Martinez is a 73 y.o. female presenting with abdominal pain and N/V. PMH significant for Diabetes, HLD, Thyroid disorder, CVA. Pharmacy has been consulted to monitor and replace electrolytes.  Diet: NPO MIVF: NaCl @ 75 mL/hr  Goal of Therapy: Electrolytes WNL  Plan:  KCL 10 mEq IV x 6  Mag 1g IV x 1  Recheck K+ @ 1600 today  Check BMP, Mg, Phos with AM labs  Thank you for allowing pharmacy to be a part of this patient's care.  Estill CHRISTELLA Lutes, PharmD, BCPS Clinical Pharmacist 01/18/2024 8:42 AM

## 2024-01-18 NOTE — Progress Notes (Signed)
 PHARMACY CONSULT NOTE - ELECTROLYTES  Pharmacy Consult for Electrolyte Monitoring and Replacement   Recent Labs: Height: 4' 11 (149.9 cm) Weight: 56.1 kg (123 lb 10.9 oz) IBW/kg (Calculated) : 43.2 Estimated Creatinine Clearance: 49.3 mL/min (by C-G formula based on SCr of 0.62 mg/dL). Potassium (mmol/L)  Date Value  01/18/2024 3.2 (L)   Magnesium  (mg/dL)  Date Value  88/73/7974 1.8   Calcium  (mg/dL)  Date Value  88/73/7974 8.5 (L)   Albumin (g/dL)  Date Value  88/75/7974 4.0  01/13/2024 4.3   Phosphorus (mg/dL)  Date Value  88/73/7974 3.2   Sodium (mmol/L)  Date Value  01/18/2024 138  01/13/2024 137    Assessment  Meredith Martinez is a 73 y.o. female presenting with abdominal pain and N/V. PMH significant for Diabetes, HLD, Thyroid disorder, CVA. Pharmacy has been consulted to monitor and replace electrolytes.  Goal of Therapy: Electrolytes WNL  administered previously today: 10 mEq IV KCl x 2. 40 mEq po KCl x 1   Plan:  40 mEq po KCl x 1  Check BMP, Mg, Phos with AM labs  Thank you for allowing pharmacy to be a part of this patient's care.  Adriana JONETTA Bolster, PharmD, BCPS Clinical Pharmacist 01/18/2024 6:54 PM

## 2024-01-18 NOTE — Transfer of Care (Signed)
 Immediate Anesthesia Transfer of Care Note  Patient: Meredith Martinez  Procedure(s) Performed: EGD (ESOPHAGOGASTRODUODENOSCOPY)  Patient Location: Endoscopy Unit  Anesthesia Type:General  Level of Consciousness: sedated and drowsy  Airway & Oxygen Therapy: Patient Spontanous Breathing  Post-op Assessment: Report given to RN and Post -op Vital signs reviewed and stable  Post vital signs: Reviewed and stable  Last Vitals:  Vitals Value Taken Time  BP 117/99 01/18/24 10:15  Temp    Pulse 63 01/18/24 10:15  Resp 19 01/18/24 10:15  SpO2 92 % 01/18/24 10:15  Vitals shown include unfiled device data.  Last Pain:  Vitals:   01/18/24 0852  TempSrc: Tympanic  PainSc:          Complications: No notable events documented.

## 2024-01-18 NOTE — Anesthesia Preprocedure Evaluation (Addendum)
 Anesthesia Evaluation  Patient identified by MRN, date of birth, ID band Patient awake    Reviewed: Allergy & Precautions, H&P , NPO status , Patient's Chart, lab work & pertinent test results  Airway Mallampati: II  TM Distance: >3 FB Neck ROM: full    Dental  (+) Edentulous Upper, Edentulous Lower   Pulmonary former smoker   Pulmonary exam normal        Cardiovascular negative cardio ROS Normal cardiovascular exam     Neuro/Psych  Neuromuscular disease CVA  negative psych ROS   GI/Hepatic Neg liver ROS,,,Gastroparesis   Endo/Other  diabetes, Type 2    Renal/GU negative Renal ROS  negative genitourinary   Musculoskeletal   Abdominal Normal abdominal exam  (+)   Peds  Hematology  (+) Blood dyscrasia, anemia   Anesthesia Other Findings Recurrent abdominal pain Hypokalemia  Past Medical History: No date: Diabetes mellitus without complication (HCC)     Comment:  niddm No date: Stroke [facility])  Past Surgical History: No date: BREAST BIOPSY; Right     Comment:  neg 06/27/2017: ESOPHAGOGASTRODUODENOSCOPY (EGD) WITH PROPOFOL ; N/A     Comment:  Procedure: ESOPHAGOGASTRODUODENOSCOPY (EGD) WITH               PROPOFOL ;  Surgeon: Therisa Bi, MD;  Location: [facility]               ENDOSCOPY;  Service: Gastroenterology;  Laterality: N/A;  BMI    Body Mass Index: 24.98 kg/m      Reproductive/Obstetrics negative OB ROS                              Anesthesia Physical Anesthesia Plan  ASA: 3  Anesthesia Plan: General   Post-op Pain Management: Minimal or no pain anticipated   Induction: Intravenous  PONV Risk Score and Plan: Propofol  infusion and TIVA  Airway Management Planned: Natural Airway  Additional Equipment:   Intra-op Plan:   Post-operative Plan:   Informed Consent: I have reviewed the patients History and Physical, chart, labs and discussed the procedure including the  risks, benefits and alternatives for the proposed anesthesia with the patient or authorized representative who has indicated his/her understanding and acceptance.     Dental Advisory Given  Plan Discussed with: CRNA and Surgeon  Anesthesia Plan Comments:          Anesthesia Quick Evaluation

## 2024-01-18 NOTE — Anesthesia Postprocedure Evaluation (Signed)
 Anesthesia Post Note  Patient: Meredith Martinez  Procedure(s) Performed: EGD (ESOPHAGOGASTRODUODENOSCOPY)  Patient location during evaluation: PACU Anesthesia Type: General Level of consciousness: awake and alert Pain management: pain level controlled Vital Signs Assessment: post-procedure vital signs reviewed and stable Respiratory status: spontaneous breathing, nonlabored ventilation and respiratory function stable Cardiovascular status: blood pressure returned to baseline and stable Postop Assessment: no apparent nausea or vomiting Anesthetic complications: no   No notable events documented.   Last Vitals:  Vitals:   01/18/24 1035 01/18/24 1130  BP: (!) 144/50 (!) 153/55  Pulse: (!) 59 (!) 57  Resp: 11 18  Temp:  37.1 C  SpO2: 99% 100%    Last Pain:  Vitals:   01/18/24 1035  TempSrc:   PainSc: 0-No pain                 Camellia Merilee Louder

## 2024-01-18 NOTE — Plan of Care (Signed)
  Problem: Education: Goal: Ability to describe self-care measures that may prevent or decrease complications (Diabetes Survival Skills Education) will improve Outcome: Progressing   Problem: Coping: Goal: Ability to adjust to condition or change in health will improve Outcome: Progressing   Problem: Fluid Volume: Goal: Ability to maintain a balanced intake and output will improve Outcome: Progressing   Problem: Health Behavior/Discharge Planning: Goal: Ability to manage health-related needs will improve Outcome: Progressing   Problem: Metabolic: Goal: Ability to maintain appropriate glucose levels will improve Outcome: Progressing   Problem: Nutritional: Goal: Maintenance of adequate nutrition will improve Outcome: Progressing   Problem: Skin Integrity: Goal: Risk for impaired skin integrity will decrease Outcome: Progressing   Problem: Tissue Perfusion: Goal: Adequacy of tissue perfusion will improve Outcome: Progressing   

## 2024-01-18 NOTE — Progress Notes (Signed)
 [facility]  - [place] at [facility]   PATIENT NAME: Meredith Martinez    MR#:  [id number]  DATE OF BIRTH:  20-Jan-1951  SUBJECTIVE:  no family at bedside left voicemail for patient's niece. No vomiting today. Patient underwent endoscopy today which was unremarkable. Started on soft diet. Replacing potassium    VITALS:  Blood pressure (!) 153/55, pulse (!) 57, temperature 98.7 F (37.1 C), resp. rate 18, height 4' 11 (1.499 m), weight 56.1 kg, SpO2 100%.  PHYSICAL EXAMINATION:   GENERAL:  73 y.o.-year-old patient with no acute distress.  LUNGS: Normal breath sounds bilaterally, no wheezing CARDIOVASCULAR: S1, S2 normal. No murmur   ABDOMEN: Soft, nontender, nondistended. Bowel sounds present.  EXTREMITIES: No  edema b/l.    NEUROLOGIC: nonfocal  patient is alert and awake   LABORATORY PANEL:  CBC Recent Labs  Lab 01/18/24 0350  WBC 5.0  HGB 11.4*  HCT 33.8*  PLT 284    Chemistries  Recent Labs  Lab 01/16/24 0428 01/17/24 0440 01/18/24 0350  NA 136   < > 138  K 3.8   < > 2.9*  CL 101   < > 103  CO2 24   < > 25  GLUCOSE 87   < > 116*  BUN 12   < > 7*  CREATININE 0.67   < > 0.62  CALCIUM  9.3   < > 8.5*  MG 1.9   < > 1.8  AST 29  --   --   ALT 20  --   --   ALKPHOS 91  --   --   BILITOT 0.6  --   --    < > = values in this interval not displayed.   Cardiac Enzymes No results for input(s): TROPONINI in the last 168 hours. RADIOLOGY:  CT Angio Abd/Pel w/ and/or w/o Result Date: 01/16/2024 CLINICAL DATA:  Mesenteric ischemia. EXAM: CTA ABDOMEN AND PELVIS WITHOUT AND WITH CONTRAST TECHNIQUE: Multidetector CT imaging of the abdomen and pelvis was performed using the standard protocol during bolus administration of intravenous contrast. Multiplanar reconstructed images and MIPs were obtained and reviewed to evaluate the vascular anatomy. RADIATION DOSE REDUCTION: This exam was performed according to the departmental dose-optimization program  which includes automated exposure control, adjustment of the mA and/or kV according to patient size and/or use of iterative reconstruction technique. CONTRAST:  OMNIPAQUE  IOHEXOL  350 MG/ML SOLN COMPARISON:  CT abdomen and pelvis 12/25/2023 FINDINGS: VASCULAR Aorta: Normal caliber aorta without aneurysm. There is questionable small peripheral focal dissection is seen in the infrarenal abdominal aorta measuring 8 mm in length. There is calcified and noncalcified atherosclerotic plaque throughout the aorta. Celiac: Patent without evidence of aneurysm, dissection, vasculitis or significant stenosis. SMA: Patent without evidence of aneurysm, dissection, vasculitis or significant stenosis. Renals: Both renal arteries are patent without evidence of aneurysm, dissection, vasculitis, fibromuscular dysplasia or significant stenosis. IMA: Patent without evidence of aneurysm, dissection, vasculitis or significant stenosis. Inflow: Patent without evidence of aneurysm, dissection, vasculitis or significant stenosis. There is calcified atherosclerotic disease. Proximal Outflow: Bilateral common femoral and visualized portions of the superficial and profunda femoral arteries are patent without evidence of aneurysm, dissection, vasculitis or significant stenosis. Veins: No obvious venous abnormality within the limitations of this arterial phase study. Superior mesenteric vein and portal vein appear patent. Review of the MIP images confirms the above findings. NON-VASCULAR Lower chest: No acute abnormality. Hepatobiliary: The gallbladder is dilated. Small gallstones are present. There is no surrounding inflammation  or ductal dilatation. The liver is within normal limits. Pancreas: Unremarkable. No pancreatic ductal dilatation or surrounding inflammatory changes. Spleen: Normal in size without focal abnormality. Adrenals/Urinary Tract: There some scarring in the superior pole of the left kidney. There is no hydronephrosis or  perinephric stranding. The adrenal glands and bladder are within normal limits. Stomach/Bowel: Stomach is within normal limits. Appendix appears normal. No evidence of bowel wall thickening, distention, or inflammatory changes. There is sigmoid colon diverticulosis. Lymphatic: No enlarged lymph nodes are seen. Reproductive: Status post hysterectomy. No adnexal masses. Other: No abdominal wall hernia or abnormality. No abdominopelvic ascites. Musculoskeletal: No acute or significant osseous findings. IMPRESSION: 1. No evidence for mesenteric ischemia. 2. Questionable small focal peripheral dissection in the infrarenal abdominal aorta. 3. Cholelithiasis with dilated gallbladder. No surrounding inflammation. 4. Colonic diverticulosis. 5. Aortic atherosclerosis. Aortic Atherosclerosis (ICD10-I70.0). Electronically Signed   By: Greig Pique M.D.   On: 01/16/2024 18:38   NM Hepato W/EF Result Date: 01/16/2024 CLINICAL DATA:  Abdominal pain, upper, chronic, assess gallbladder motility EXAM: NUCLEAR MEDICINE HEPATOBILIARY IMAGING WITH GALLBLADDER EF TECHNIQUE: Sequential images of the abdomen were obtained out to 60 minutes following intravenous administration of radiopharmaceutical. After oral ingestion of Ensure, gallbladder ejection fraction was determined. At 60 min, normal ejection fraction is greater than 33%. RADIOPHARMACEUTICALS:  5.25 mCi Tc-39m  Choletec  IV COMPARISON:  Abdominal ultrasound 01/15/2024. Abdominal CT 12/25/2023. FINDINGS: Technical note: Patient became nauseated and vomited after Ensure consumption. Prompt uptake and biliary excretion of activity by the liver is seen. Gallbladder activity is visualized, consistent with patency of cystic duct. Biliary activity passes into small bowel, consistent with patent common bile duct. Calculated gallbladder ejection fraction is 47%. (Normal gallbladder ejection fraction with Ensure is greater than 33% and less than 80%.) IMPRESSION: 1. The cystic and  common bile ducts are patent. 2. Gallbladder ejection fraction within normal limits (47%). Electronically Signed   By: Elsie Perone M.D.   On: 01/16/2024 15:23    Assessment and Plan  Meredith Martinez is a 73 y.o. female with type 2 diabetes with peripheral neuropathy, hypertension, atherosclerosis of bilateral lower extremities, history of CVA, B12 deficiency, thyroid disorder, hyperlipidemia, who has had multiple presentations to the ED over the last month for epigastric pain.   Presented again to the ED today with abdominal pain, nausea, vomiting over the last two weeks with acute worsening in the last 3 to 4 days.   Pain primarily in the epigastrium but does radiate to right upper quadrant. She reports the pain is so bad that she cannot sleep. She endorses persistent bloating and fullness sensation. Right upper quadrant ultrasound in ED reveals cholelithiasis but no acute cholecystitis.  EDP discussed directly with general surgery who has evaluated the patient and recommended HIDA scan for now.   # Recurrent abdominal pain ?Diabetic Gastroparesis - With associated nausea and  vomiting. - CT 11/2 without clear pathology - Right upper quadrant ultrasound with cholelithiasis but no cholecystitis.   HIDA scan: Cystic and CBD are patent, GB EF 47% General surgery recommended no surgical intervention - Suspect part of this may be secondary to diabetic gastroparesis.  Continue reglan . -- consider gastric emptying study as an outpatient, cannot be done as an inpatient. --  CT angio a/p negative for mesenteric ischemia.  Chronic abdominal aortic dissection.  Vascular surgery consulted, recommended follow-up as an outpatient up to 6 months.  No surgical intervention. -- Bentyl  for symptomatic treatment -- PPI for GI prophylax ---received IV fluid for hydration -  Continue soft diet -- G.I. consult noted. EGD unremarkable. G.I. feels this could be musculoskeletal from nausea vomiting earlier.  Signed off. -- Started on soft carb control diet   # Hypophosphatemia, hypokalemia -- pharmacy to replete  # UTI UA positive, patient was complaining of dysuria Ceftriaxone  x 3 days, last dose on 11/26 Urine culture, growing multiple species suggested recollection     # Type 2 diabetes with peripheral neuropathy, uncontrolled with hyperglycemia, noncompliance to diet - Hemoglobin A1c 12.3% - Started on sliding scale with basal/bolus titration daily -- Diabetes education consult for diet education patient will need-- endocrinology referral as outpatient. Will defer to PCP   # Hypertension - resume home meds   # Atherosclerosis of bilateral lower extremities - Continue home meds   # History of CVA - Continue statin at DC   # B12 deficiency - Continue supplementation   # Hyperlipidemia  - Statin     Body mass index is 24.98 kg/m.    Diet: soft diet DVT Prophylaxis: Subcutaneous Heparin    Procedures: EGD Family communication : left message for niece Geni Consults : G.I. CODE STATUS: full  Level of care: Med-Surg Status is: Inpatient Remains inpatient appropriate because: advance diet today will monitor for nausea vomiting replace potassium.    TOTAL TIME TAKING CARE OF THIS PATIENT: 35 minutes.  >50% time spent on counselling and coordination of care  Note: This dictation was prepared with Dragon dictation along with smaller phrase technology. Any transcriptional errors that result from this process are unintentional.  Leita Blanch M.D    [facility]   CC: Primary care physician; Kristina Tinnie POUR, PA-C

## 2024-01-19 DIAGNOSIS — Z794 Long term (current) use of insulin: Secondary | ICD-10-CM | POA: Diagnosis not present

## 2024-01-19 DIAGNOSIS — I7143 Infrarenal abdominal aortic aneurysm, without rupture: Secondary | ICD-10-CM | POA: Diagnosis not present

## 2024-01-19 DIAGNOSIS — R1013 Epigastric pain: Secondary | ICD-10-CM | POA: Diagnosis not present

## 2024-01-19 DIAGNOSIS — E1169 Type 2 diabetes mellitus with other specified complication: Secondary | ICD-10-CM | POA: Diagnosis not present

## 2024-01-19 LAB — GLUCOSE, CAPILLARY
Glucose-Capillary: 143 mg/dL — ABNORMAL HIGH (ref 70–99)
Glucose-Capillary: 159 mg/dL — ABNORMAL HIGH (ref 70–99)
Glucose-Capillary: 207 mg/dL — ABNORMAL HIGH (ref 70–99)
Glucose-Capillary: 199 mg/dL — ABNORMAL HIGH (ref 70–99)

## 2024-01-19 LAB — BASIC METABOLIC PANEL WITH GFR
Anion gap: 10 (ref 5–15)
BUN: <5 mg/dL — ABNORMAL LOW (ref 8–23)
CO2: 22 mmol/L (ref 22–32)
Calcium: 9 mg/dL (ref 8.9–10.3)
Chloride: 105 mmol/L (ref 98–111)
Creatinine, Ser: 0.66 mg/dL (ref 0.44–1.00)
GFR, Estimated: >60 mL/min (ref >60)
Glucose, Bld: 186 mg/dL — ABNORMAL HIGH (ref 70–99)
Potassium: 4.3 mmol/L (ref 3.5–5.1)
Sodium: 137 mmol/L (ref 135–145)

## 2024-01-19 LAB — MAGNESIUM: Magnesium: 2.2 mg/dL (ref 1.7–2.4)

## 2024-01-19 LAB — PHOSPHORUS: Phosphorus: 1.4 mg/dL — ABNORMAL LOW (ref 2.5–4.6)

## 2024-01-19 MED ORDER — GLUCERNA SHAKE PO LIQD
237.0000 mL | Freq: Two times a day (BID) | ORAL | Status: DC
  Administered 2024-01-19: 237 mL via ORAL

## 2024-01-19 MED ORDER — K PHOS MONO-SOD PHOS DI & MONO 155-852-130 MG PO TABS
500.0000 mg | ORAL_TABLET | ORAL | Status: AC
  Administered 2024-01-19 (×3): 500 mg via ORAL
  Filled 2024-01-19 (×3): qty 2

## 2024-01-19 NOTE — Progress Notes (Signed)
 Mobility Specialist - Progress Note   01/19/24 1351  Mobility  Activity Ambulated with assistance  Level of Assistance Standby assist, set-up cues, supervision of patient - no hands on  Assistive Device Front wheel walker  Distance Ambulated (ft) 160 ft  Activity Response Tolerated well  Mobility visit 1 Mobility  Mobility Specialist Start Time (ACUTE ONLY) 1337  Mobility Specialist Stop Time (ACUTE ONLY) 1344  Mobility Specialist Time Calculation (min) (ACUTE ONLY) 7 min   Pt standing near the bedside upon entry, utilizing RA. Pt amb one lap around the NS w/ sup (chair follow for safety), tolerated well. During amb Pt expressed burning of BLE earlier this morning while supine in bed, RN notified. Pt returned to the room, left seated EOB with needs within reach.   America Silvan Mobility Specialist 01/19/24 1:57 PM

## 2024-01-19 NOTE — Progress Notes (Signed)
 [facility]  - [place] at [facility]   PATIENT NAME: Meredith Martinez    MR#:  [id number]  DATE OF BIRTH:  Aug 20, 1950  SUBJECTIVE:  no family at bedside left voicemail for patient's niece. No vomiting today.  Pt is trying to eat some eggs this am. Discussed about glucerna  VITALS:  Blood pressure 136/67, pulse 66, temperature 97.6 F (36.4 C), resp. rate 16, height 4' 11 (1.499 m), weight 56.1 kg, SpO2 97%.  PHYSICAL EXAMINATION:   GENERAL:  73 y.o.-year-old patient with no acute distress.  LUNGS: Normal breath sounds bilaterally, no wheezing CARDIOVASCULAR: S1, S2 normal. No murmur   ABDOMEN: Soft, nontender, nondistended. Bowel sounds present.  EXTREMITIES: No  edema b/l.    NEUROLOGIC: nonfocal  patient is alert and awake   LABORATORY PANEL:  CBC Recent Labs  Lab 01/18/24 0350  WBC 5.0  HGB 11.4*  HCT 33.8*  PLT 284    Chemistries  Recent Labs  Lab 01/16/24 0428 01/17/24 0440 01/19/24 0405  NA 136   < > 137  K 3.8   < > 4.3  CL 101   < > 105  CO2 24   < > 22  GLUCOSE 87   < > 186*  BUN 12   < > <5*  CREATININE 0.67   < > 0.66  CALCIUM  9.3   < > 9.0  MG 1.9   < > 2.2  AST 29  --   --   ALT 20  --   --   ALKPHOS 91  --   --   BILITOT 0.6  --   --    < > = values in this interval not displayed.     Assessment and Plan  Meredith Martinez is a 73 y.o. female with type 2 diabetes with peripheral neuropathy, hypertension, atherosclerosis of bilateral lower extremities, history of CVA, B12 deficiency, thyroid disorder, hyperlipidemia, who has had multiple presentations to the ED over the last month for epigastric pain.   Presented again to the ED today with abdominal pain, nausea, vomiting over the last two weeks with acute worsening in the last 3 to 4 days.   Pain primarily in the epigastrium but does radiate to right upper quadrant. She reports the pain is so bad that she cannot sleep. She endorses persistent bloating and fullness  sensation. Right upper quadrant ultrasound in ED reveals cholelithiasis but no acute cholecystitis.  EDP discussed directly with general surgery who has evaluated the patient and recommended HIDA scan for now.   # Recurrent abdominal pain ?Diabetic Gastroparesis - With associated nausea and  vomiting. - CT 11/2 without clear pathology - Right upper quadrant ultrasound with cholelithiasis but no cholecystitis.   HIDA scan: Cystic and CBD are patent, GB EF 47% General surgery recommended no surgical intervention - Suspect part of this may be secondary to diabetic gastroparesis.  Continue reglan . -- consider gastric emptying study as an outpatient, cannot be done as an inpatient. --  CT angio a/p negative for mesenteric ischemia.  Chronic abdominal aortic dissection.  Vascular surgery consulted, recommended follow-up as an outpatient up to 6 months.  No surgical intervention. -- Bentyl  for symptomatic treatment -- PPI for GI prophylax ---received IV fluid for hydration -Continue soft diet -- G.I. consult noted. EGD unremarkable. G.I. feels this could be musculoskeletal from nausea vomiting earlier. Signed off. -- Started on soft carb control diet   # Hypophosphatemia, hypokalemia -- pharmacy to replete  # UTI UA positive,  patient was complaining of dysuria Ceftriaxone  x 3 days, last dose on 11/26 Urine culture, growing multiple species suggested recollection     # Type 2 diabetes with peripheral neuropathy, uncontrolled with hyperglycemia, noncompliance to diet - Hemoglobin A1c 12.3% - Started on sliding scale with basal/bolus titration daily -- Diabetes education consult for diet education patient will need-- endocrinology referral as outpatient. Will defer to PCP   # Hypertension - resume home meds   # Atherosclerosis of bilateral lower extremities - Continue home meds   # History of CVA - Continue statin at DC   # B12 deficiency - Continue supplementation   #  Hyperlipidemia  - Statin     Body mass index is 24.98 kg/m.    Diet: soft diet DVT Prophylaxis: Subcutaneous Heparin    Procedures: EGD Family communication : left message for niece Geni Consults : G.I. CODE STATUS: full  Level of care: Med-Surg Status is: Inpatient Remains inpatient appropriate because: advance diet today will monitor for nausea vomiting replace potassium and phosphorus    TOTAL TIME TAKING CARE OF THIS PATIENT: 35 minutes.  >50% time spent on counselling and coordination of care  Note: This dictation was prepared with Dragon dictation along with smaller phrase technology. Any transcriptional errors that result from this process are unintentional.  Leita Blanch M.D    [facility]   CC: Primary care physician; Kristina Tinnie POUR, PA-C

## 2024-01-19 NOTE — Progress Notes (Signed)
 PHARMACY CONSULT NOTE - ELECTROLYTES  Pharmacy Consult for Electrolyte Monitoring and Replacement   Recent Labs: Height: 4' 11 (149.9 cm) Weight: 56.1 kg (123 lb 10.9 oz) IBW/kg (Calculated) : 43.2 Estimated Creatinine Clearance: 49.3 mL/min (by C-G formula based on SCr of 0.66 mg/dL). Potassium (mmol/L)  Date Value  01/19/2024 4.3   Magnesium  (mg/dL)  Date Value  88/72/7974 2.2   Calcium  (mg/dL)  Date Value  88/72/7974 9.0   Albumin (g/dL)  Date Value  88/75/7974 4.0  01/13/2024 4.3   Phosphorus (mg/dL)  Date Value  88/72/7974 1.4 (L)   Sodium (mmol/L)  Date Value  01/19/2024 137  01/13/2024 137    Assessment  Meredith Martinez is a 73 y.o. female presenting with abdominal pain and N/V. PMH significant for Diabetes, HLD, Thyroid disorder, CVA. Pharmacy has been consulted to monitor and replace electrolytes.  Goal of Therapy: Electrolytes WNL  administered previously today: 10 mEq IV KCl x 2. 40 mEq po KCl x 1   Plan:  Kphos 500mg  PO x 3 doses Check BMP, Mg, Phos with AM labs  Thank you for allowing pharmacy to be a part of this patient's care.  Meredith Martinez A Larie Mathes, PharmD Clinical Pharmacist 01/19/2024 7:35 AM

## 2024-01-19 NOTE — Plan of Care (Signed)
  Problem: Education: Goal: Knowledge of General Education information will improve Description: Including pain rating scale, medication(s)/side effects and non-pharmacologic comfort measures Outcome: Progressing   Problem: Health Behavior/Discharge Planning: Goal: Ability to manage health-related needs will improve Outcome: Progressing   Problem: Clinical Measurements: Goal: Respiratory complications will improve Outcome: Progressing   Problem: Clinical Measurements: Goal: Cardiovascular complication will be avoided Outcome: Progressing   Problem: Activity: Goal: Risk for activity intolerance will decrease Outcome: Progressing   Problem: Pain Managment: Goal: General experience of comfort will improve and/or be controlled Outcome: Progressing   Problem: Nutrition: Goal: Adequate nutrition will be maintained Outcome: Progressing

## 2024-01-19 NOTE — Plan of Care (Signed)
  Problem: Coping: Goal: Ability to adjust to condition or change in health will improve Outcome: Progressing   Problem: Fluid Volume: Goal: Ability to maintain a balanced intake and output will improve Outcome: Progressing   Problem: Nutritional: Goal: Maintenance of adequate nutrition will improve Outcome: Progressing   Problem: Skin Integrity: Goal: Risk for impaired skin integrity will decrease Outcome: Progressing   Problem: Tissue Perfusion: Goal: Adequacy of tissue perfusion will improve Outcome: Progressing   Problem: Clinical Measurements: Goal: Will remain free from infection Outcome: Progressing Goal: Diagnostic test results will improve Outcome: Progressing Goal: Respiratory complications will improve Outcome: Progressing Goal: Cardiovascular complication will be avoided Outcome: Progressing

## 2024-01-20 LAB — BASIC METABOLIC PANEL WITH GFR
Anion gap: 9 (ref 5–15)
BUN: <5 mg/dL — ABNORMAL LOW (ref 8–23)
CO2: 26 mmol/L (ref 22–32)
Calcium: 9.1 mg/dL (ref 8.9–10.3)
Chloride: 104 mmol/L (ref 98–111)
Creatinine, Ser: 0.61 mg/dL (ref 0.44–1.00)
GFR, Estimated: >60 mL/min (ref >60)
Glucose, Bld: 140 mg/dL — ABNORMAL HIGH (ref 70–99)
Potassium: 3.4 mmol/L — ABNORMAL LOW (ref 3.5–5.1)
Sodium: 139 mmol/L (ref 135–145)

## 2024-01-20 LAB — GLUCOSE, CAPILLARY: Glucose-Capillary: 117 mg/dL — ABNORMAL HIGH (ref 70–99)

## 2024-01-20 LAB — SURGICAL PATHOLOGY

## 2024-01-20 LAB — PHOSPHORUS: Phosphorus: 3.1 mg/dL (ref 2.5–4.6)

## 2024-01-20 LAB — MAGNESIUM: Magnesium: 2.1 mg/dL (ref 1.7–2.4)

## 2024-01-20 MED ORDER — METOCLOPRAMIDE HCL 5 MG PO TABS: 5.0000 mg | ORAL_TABLET | Freq: Three times a day (TID) | ORAL | 0 refills | Status: DC

## 2024-01-20 MED ORDER — ONDANSETRON 4 MG PO TBDP: 4.0000 mg | ORAL_TABLET | Freq: Three times a day (TID) | ORAL | 0 refills | Status: DC | PRN

## 2024-01-20 MED ORDER — LISINOPRIL 20 MG PO TABS: 20.0000 mg | ORAL_TABLET | Freq: Every day | ORAL | 0 refills | Status: DC

## 2024-01-20 MED ORDER — GLUCERNA SHAKE PO LIQD: 237.0000 mL | Freq: Two times a day (BID) | ORAL | 0 refills | Status: AC

## 2024-01-20 MED ORDER — POTASSIUM CHLORIDE CRYS ER 20 MEQ PO TBCR
40.0000 meq | EXTENDED_RELEASE_TABLET | Freq: Two times a day (BID) | ORAL | Status: DC
  Administered 2024-01-20: 40 meq via ORAL
  Filled 2024-01-20: qty 2

## 2024-01-20 MED ORDER — TRAMADOL HCL 50 MG PO TABS: ORAL_TABLET | ORAL | 0 refills | Status: DC

## 2024-01-20 NOTE — Progress Notes (Signed)
 IV removed without complications. Discharge instructions reviewed. Patient dressed and toileted. Niece gone to bring car to medical mall. Patient being wheeled to the medical mall exit to be discharged to the care of niece, in stable condition.  Meredith Martinez V Meredith Martinez

## 2024-01-20 NOTE — Discharge Summary (Incomplete)
 Physician Discharge Summary   Patient: Meredith Martinez MRN: [id number] DOB: Apr 17, 1950  Admit date:     01/15/2024  Discharge date: {dischdate:26783}  Discharge Physician: Leita Blanch   PCP: Kristina Tinnie POUR, PA-C   Recommendations at discharge:  {Tip this will not be part of the note when signed- Example include specific recommendations for outpatient follow-up, pending tests to follow-up on. (Optional):26781}  ***  Discharge Diagnoses: Principal Problem:   Abdominal pain Active Problems:   Infrarenal abdominal aortic aneurysm (AAA) without rupture   Abdominal pain, epigastric   Diabetes mellitus (HCC)  Resolved Problems:   * No resolved hospital problems. [facility] Course: No notes on file  Assessment and Plan: No notes have been filed under this hospital service. Service: Hospitalist     {Tip this will not be part of the note when signed Body mass index is 24.98 kg/m. , ,  (Optional):26781}  {(NOTE) Pain control PDMP Statment (Optional):26782} Consultants: *** Procedures performed: ***  Disposition: {Plan; Disposition:26390} Diet recommendation:  Discharge Diet Orders (From admission, onward)     Start     Ordered   01/20/24 0000  Diet Carb Modified        01/20/24 1141           {Diet_Plan:26776} DISCHARGE MEDICATION: Allergies as of 01/20/2024       Reactions   Iodine Other (See Comments)   Convulsions    Fish Allergy Nausea And Vomiting, Other (See Comments)   Convulsions    Amoxicillin Nausea And Vomiting   Lyrica  [pregabalin ] Nausea And Vomiting   Vomiting when taking 50mg  dose, 25mg  tolerated but ineffective        Medication List     TAKE these medications    Accu-Chek Guide Test test strip Generic drug: glucose blood selo para controlar el nivel de azcar en gap inc o tres veces al da. (Use to check blood sugar two to three times daily)   Accu-Chek Guide w/Device Kit selo para controlar el nivel de azcar en  sangre dos o tres veces al da. (Use to check blood glucose two to three times daily)   Accu-Chek Softclix Lancets lancets selo para controlar el nivel de azcar en gap inc o tres veces al da. (Use to check blood glucose two to three times daily)   Aspirin  Low Dose 81 MG tablet Generic drug: aspirin  EC Tome 1 comprimido (81 mg en total) por va oral al da. Trguelo entero. (Take 1 tablet (81 mg total) by mouth daily. Swallow whole.)   atorvastatin  80 MG tablet Commonly known as: LIPITOR Tome 1 tableta (80 mg en total) por va oral diariamente. (Take 1 tablet (80 mg total) by mouth daily.)   cyanocobalamin  1000 MCG tablet Tome 1 tableta (1000 mcg en total) por va oral diariamente. (Take 1 tablet (1,000 mcg total) by mouth daily.)   Dexcom G7 Receiver Devi USE AS DIRECTED DX E11.65   Dexcom G7 Sensor Misc USE AS DIRECTED FOR 10 DAYS   famotidine  20 MG tablet Commonly known as: PEPCID  TAKE 1 TABLET BY MOUTH EVERYDAY AT BEDTIME   feeding supplement (GLUCERNA SHAKE) Liqd Take 237 mLs by mouth 2 (two) times daily.   hydrochlorothiazide  12.5 MG tablet Commonly known as: HYDRODIURIL  TAKE 1 TABLET BY MOUTH EVERY DAY AS NEEDED FOR SWELLING   Icy Hot Max Lidocaine  4-1 % Crea Generic drug: Lidocaine -Menthol Apply 1 Application topically at bedtime.   insulin  glargine 100 UNIT/ML Solostar Pen Commonly known as: LANTUS  Inyectar 20 Unidades  en la piel antes de acostarse. (Inject 20 Units into the skin at bedtime.)   Insupen Pen Needles 32G X 4 MM Misc Generic drug: Insulin  Pen Needle selo para tomar inyecciones de insulina. (Used to take insulin  injections)   Jardiance  25 MG Tabs tablet Generic drug: empagliflozin  Take 1 tablet (25 mg total) by mouth daily.   levocetirizine 5 MG tablet Commonly known as: XYZAL Take 5 mg by mouth daily.   lisinopril  20 MG tablet Commonly known as: ZESTRIL  Take 1 tablet (20 mg total) by mouth daily. Start taking on: January 21, 2024 What changed:  medication strength how much to take   metFORMIN  500 MG tablet Commonly known as: GLUCOPHAGE  Take 2 tablets by mouth in AM   metoCLOPramide  5 MG tablet Commonly known as: REGLAN  Take 1 tablet (5 mg total) by mouth 3 (three) times daily before meals.   ondansetron  4 MG disintegrating tablet Commonly known as: ZOFRAN -ODT Take 1 tablet (4 mg total) by mouth every 8 (eight) hours as needed for nausea or vomiting.   Salonpas Lidocaine  Plus 4-10 % Crea Generic drug: Lidocaine  HCl-Benzyl Alcohol Apply 1 Application topically at bedtime.   traMADol  50 MG tablet Commonly known as: ULTRAM  Take 1/2-1 tablet by mouth twice daily as needed for pain.   Vitamin D -3 25 MCG (1000 UT) Caps Take by mouth daily.        Follow-up Information     Dew, Selinda RAMAN, MD Follow up in 4 month(s).   Specialties: Vascular Surgery, Radiology, Interventional Cardiology Why: for smal chronic aortic dissection Contact information: [address] [place] [address] [phone]         Kristina Tinnie POUR, PA-C. Schedule an appointment as soon as possible for a visit in 1 week(s).   Specialty: Physician Assistant Contact information: [address] [place] [address] [phone]         Maryruth Ole DASEN, MD. Schedule an appointment as soon as possible for a visit in 1 week(s).   Specialty: Gastroenterology Why: for Gastirc emptying study and f/u ?gastroparesis Contact information: [address] [place] [address] [phone]                Discharge Exam: Fredricka Weights   01/15/24 0625  Weight: 56.1 kg   ***  Condition at discharge: {DC Condition:26389}  The results of significant diagnostics from this hospitalization (including imaging, microbiology, ancillary and laboratory) are listed below for reference.   Imaging Studies: CT Angio Abd/Pel w/ and/or w/o Result Date: 01/16/2024 CLINICAL DATA:  Mesenteric  ischemia. EXAM: CTA ABDOMEN AND PELVIS WITHOUT AND WITH CONTRAST TECHNIQUE: Multidetector CT imaging of the abdomen and pelvis was performed using the standard protocol during bolus administration of intravenous contrast. Multiplanar reconstructed images and MIPs were obtained and reviewed to evaluate the vascular anatomy. RADIATION DOSE REDUCTION: This exam was performed according to the departmental dose-optimization program which includes automated exposure control, adjustment of the mA and/or kV according to patient size and/or use of iterative reconstruction technique. CONTRAST:  OMNIPAQUE  IOHEXOL  350 MG/ML SOLN COMPARISON:  CT abdomen and pelvis 12/25/2023 FINDINGS: VASCULAR Aorta: Normal caliber aorta without aneurysm. There is questionable small peripheral focal dissection is seen in the infrarenal abdominal aorta measuring 8 mm in length. There is calcified and noncalcified atherosclerotic plaque throughout the aorta. Celiac: Patent without evidence of aneurysm, dissection, vasculitis or significant stenosis. SMA: Patent without evidence of aneurysm, dissection, vasculitis or significant stenosis. Renals: Both renal arteries are patent  without evidence of aneurysm, dissection, vasculitis, fibromuscular dysplasia or significant stenosis. IMA: Patent without evidence of aneurysm, dissection, vasculitis or significant stenosis. Inflow: Patent without evidence of aneurysm, dissection, vasculitis or significant stenosis. There is calcified atherosclerotic disease. Proximal Outflow: Bilateral common femoral and visualized portions of the superficial and profunda femoral arteries are patent without evidence of aneurysm, dissection, vasculitis or significant stenosis. Veins: No obvious venous abnormality within the limitations of this arterial phase study. Superior mesenteric vein and portal vein appear patent. Review of the MIP images confirms the above findings. NON-VASCULAR Lower chest: No acute  abnormality. Hepatobiliary: The gallbladder is dilated. Small gallstones are present. There is no surrounding inflammation or ductal dilatation. The liver is within normal limits. Pancreas: Unremarkable. No pancreatic ductal dilatation or surrounding inflammatory changes. Spleen: Normal in size without focal abnormality. Adrenals/Urinary Tract: There some scarring in the superior pole of the left kidney. There is no hydronephrosis or perinephric stranding. The adrenal glands and bladder are within normal limits. Stomach/Bowel: Stomach is within normal limits. Appendix appears normal. No evidence of bowel wall thickening, distention, or inflammatory changes. There is sigmoid colon diverticulosis. Lymphatic: No enlarged lymph nodes are seen. Reproductive: Status post hysterectomy. No adnexal masses. Other: No abdominal wall hernia or abnormality. No abdominopelvic ascites. Musculoskeletal: No acute or significant osseous findings. IMPRESSION: 1. No evidence for mesenteric ischemia. 2. Questionable small focal peripheral dissection in the infrarenal abdominal aorta. 3. Cholelithiasis with dilated gallbladder. No surrounding inflammation. 4. Colonic diverticulosis. 5. Aortic atherosclerosis. Aortic Atherosclerosis (ICD10-I70.0). Electronically Signed   By: Greig Pique M.D.   On: 01/16/2024 18:38   NM Hepato W/EF Result Date: 01/16/2024 CLINICAL DATA:  Abdominal pain, upper, chronic, assess gallbladder motility EXAM: NUCLEAR MEDICINE HEPATOBILIARY IMAGING WITH GALLBLADDER EF TECHNIQUE: Sequential images of the abdomen were obtained out to 60 minutes following intravenous administration of radiopharmaceutical. After oral ingestion of Ensure, gallbladder ejection fraction was determined. At 60 min, normal ejection fraction is greater than 33%. RADIOPHARMACEUTICALS:  5.25 mCi Tc-11m  Choletec  IV COMPARISON:  Abdominal ultrasound 01/15/2024. Abdominal CT 12/25/2023. FINDINGS: Technical note: Patient became nauseated  and vomited after Ensure consumption. Prompt uptake and biliary excretion of activity by the liver is seen. Gallbladder activity is visualized, consistent with patency of cystic duct. Biliary activity passes into small bowel, consistent with patent common bile duct. Calculated gallbladder ejection fraction is 47%. (Normal gallbladder ejection fraction with Ensure is greater than 33% and less than 80%.) IMPRESSION: 1. The cystic and common bile ducts are patent. 2. Gallbladder ejection fraction within normal limits (47%). Electronically Signed   By: Elsie Perone M.D.   On: 01/16/2024 15:23   US  Abdomen Limited RUQ (LIVER/GB) Result Date: 01/15/2024 EXAM: Right Upper Quadrant Abdominal Ultrasound 01/15/2024 08:14:37 AM TECHNIQUE: Real-time ultrasonography of the right upper quadrant of the abdomen was performed. COMPARISON: CT abdomen and pelvis 12/25/2023. CLINICAL HISTORY: Biliary colic. FINDINGS: LIVER: The liver demonstrates normal echogenicity. No intrahepatic biliary ductal dilatation. No evidence of mass. The portal vein is patent with normal flow towards the liver. BILIARY SYSTEM: Sludge is seen layering within the dependent portion of the gallbladder. Gallbladder wall thickness is within normal limits. No pericholecystic fluid or sonographic murphy sign. Common bile duct measures 5.5 mm without intrahepatic bile duct dilatation. No cholelithiasis. OTHER: No right upper quadrant ascites. IMPRESSION: 1. Gallbladder sludge without gallbladder wall thickening, pericholecystic fluid, or sonographic murphy sign. If there is high clinical concern for acute cholecystitis consider further evaluation with follow-up nuclear medicine hepatobiliary scan. Electronically signed  by: Waddell Calk MD 01/15/2024 08:23 AM EST RP Workstation: [id number]   CT ABDOMEN PELVIS WO CONTRAST Result Date: 12/25/2023 EXAM: CT ABDOMEN AND PELVIS WITHOUT CONTRAST 12/25/2023 07:28:32 PM TECHNIQUE: CT of the abdomen and pelvis was  performed without the administration of intravenous contrast. Multiplanar reformatted images are provided for review. Automated exposure control, iterative reconstruction, and/or weight-based adjustment of the mA/kV was utilized to reduce the radiation dose to as low as reasonably achievable. COMPARISON: CT abdomen and pelvis 06/30/2023. CLINICAL HISTORY: Abdominal pain, acute, nonlocalized. FINDINGS: LOWER CHEST: No acute abnormality. LIVER: The liver is unremarkable. GALLBLADDER AND BILE DUCTS: Layering hyperdensity within the gallbladder lumen. Myopic gallbladder. No gallbladder wall thickening or pericholecystic fluid. No biliary ductal dilatation. SPLEEN: No acute abnormality. PANCREAS: No acute abnormality. ADRENAL GLANDS: No acute abnormality. KIDNEYS, URETERS AND BLADDER: Punctate bilateral nephrolithiasis. No ureterolithiasis. No hydroureteronephrosis. No perinephric or periureteral stranding. Urinary bladder is unremarkable. GI AND BOWEL: Stomach demonstrates no acute abnormality. No small or large bowel wall thickening. Few scattered colonic diverticula. The appendix is normal in caliber. There is no bowel obstruction. PERITONEUM AND RETROPERITONEUM: No ascites. No free air. VASCULATURE: Aorta is normal in caliber. Moderate-to-severe atherosclerotic plaque. LYMPH NODES: No lymphadenopathy. REPRODUCTIVE ORGANS: Atrophic uterus versus hysterectomy. No adnexal mass. BONES AND SOFT TISSUES: No acute osseous abnormality. Tiny fat-containing umbilical hernia. IMPRESSION: 1. No acute findings in the abdomen or pelvis. 2. Cholelithiasis with nonspecific hydropic gallbladder and no definite CT findings of acute cholecystitis. 3. Nonobstructive punctate bilateral nephrolithiasis. 4. Tiny fat-containing umbilical hernia. Electronically signed by: Kate Plummer MD 12/25/2023 07:37 PM EST RP Workstation: [id number]    Microbiology: Results for orders placed or performed during the hospital encounter of 01/15/24   Urine Culture (for pregnant, neutropenic or urologic patients or patients with an indwelling urinary catheter)     Status: Abnormal   Collection Time: 01/15/24  6:29 PM   Specimen: Urine, Clean Catch  Result Value Ref Range Status   Specimen Description   Final    URINE, CLEAN CATCH Performed at [facility], [address] [place], [place] [address]    Special Requests   Final    NONE Performed at [facility], [address] [place], [place] [address]    Culture MULTIPLE SPECIES PRESENT, SUGGEST RECOLLECTION (A)  Final   Report Status 01/17/2024 FINAL  Final    Labs: CBC: Recent Labs  Lab 01/13/24 1321 01/15/24 0715 01/16/24 0428 01/17/24 0440 01/18/24 0350  WBC 7.2 7.2 5.5 6.6 5.0  NEUTROABS 4.8  --   --   --   --   HGB 14.0 13.9 12.6 12.8 11.4*  HCT 42.2 41.4 37.6 38.1 33.8*  MCV 91 88.1 88.9 87.8 87.6  PLT 311 348 315 324 284   Basic Metabolic Panel: Recent Labs  Lab 01/16/24 0428 01/17/24 0440 01/18/24 0350 01/18/24 1749 01/19/24 0405 01/20/24 0412  NA 136 136 138  --  137 139  K 3.8 3.6 2.9* 3.2* 4.3 3.4*  CL 101 103 103  --  105 104  CO2 24 22 25   --  22 26  GLUCOSE 87 95 116*  --  186* 140*  BUN 12 8 7*  --  <5* <5*  CREATININE 0.67 0.57 0.62  --  0.66 0.61  CALCIUM  9.3 9.0 8.5*  --  9.0 9.1  MG 1.9 1.9 1.8  --  2.2 2.1  PHOS 2.8 2.3* 3.2  --  1.4* 3.1   Liver Function Tests: Recent Labs  Lab 01/13/24 1321 01/15/24 0715 01/16/24 0428  AST 29 33 29  ALT 22 20 20   ALKPHOS 115 109 91  BILITOT 0.6 0.8 0.6  PROT 7.6 8.4* 7.2  ALBUMIN 4.3 4.5 4.0   CBG: Recent Labs  Lab 01/19/24 0746 01/19/24 1222 01/19/24 1713 01/19/24 2041 01/20/24 0753  GLUCAP 143* 159* 207* 199* 117*    Discharge time spent: {LESS THAN/GREATER UYJW:73611} 30 minutes.  Signed: Leita Blanch, MD [facility] 01/20/2024

## 2024-01-20 NOTE — Discharge Instructions (Signed)
 Keep log of his sugars at home and discussed with your PCP

## 2024-01-20 NOTE — Progress Notes (Signed)
 PHARMACY CONSULT NOTE - ELECTROLYTES  Pharmacy Consult for Electrolyte Monitoring and Replacement   Recent Labs: Height: 4' 11 (149.9 cm) Weight: 56.1 kg (123 lb 10.9 oz) IBW/kg (Calculated) : 43.2 Estimated Creatinine Clearance: 49.3 mL/min (by C-G formula based on SCr of 0.61 mg/dL). Potassium (mmol/L)  Date Value  01/20/2024 3.4 (L)   Magnesium  (mg/dL)  Date Value  88/71/7974 2.1   Calcium  (mg/dL)  Date Value  88/71/7974 9.1   Albumin (g/dL)  Date Value  88/75/7974 4.0  01/13/2024 4.3   Phosphorus (mg/dL)  Date Value  88/71/7974 3.1   Sodium (mmol/L)  Date Value  01/20/2024 139  01/13/2024 137   Assessment  Meredith Martinez is a 73 y.o. female presenting with abdominal pain and N/V. PMH significant for Diabetes, HLD, Thyroid disorder, CVA. Pharmacy has been consulted to monitor and replace electrolytes.  Goal of Therapy: Electrolytes WNL  Plan:  K 3.4, Kcl 40 mEq PO BID x 2 doses Re-check all labs one more time tomorrow AM. If stable will consider signing off on consult  Thank you for allowing pharmacy to be a part of this patient's care.  Marolyn KATHEE Mare 01/20/2024 7:32 AM

## 2024-01-20 NOTE — Plan of Care (Signed)

## 2024-01-23 ENCOUNTER — Ambulatory Visit: Payer: Self-pay | Admitting: Gastroenterology

## 2024-01-23 NOTE — Discharge Summary (Signed)
 Physician Discharge Summary   Patient: Meredith Martinez MRN: [id number] DOB: [birth year]  Admit date:     01/15/2024  Discharge date: 01/23/24  Discharge Physician: Leita Blanch   PCP: Kristina Tinnie POUR, PA-C   Recommendations at discharge:    F/u PCP in 1-2 weeks F/u [facility] in 1-2 weeks Keep log o sugars at home  Discharge Diagnoses: Principal Problem:   Abdominal pain Active Problems:   Infrarenal abdominal aortic aneurysm (AAA) without rupture   Abdominal pain, epigastric   Diabetes mellitus (HCC)  Meredith Martinez is a 73 y.o. female with type 2 diabetes with peripheral neuropathy, hypertension, atherosclerosis of bilateral lower extremities, history of CVA, B12 deficiency, thyroid disorder, hyperlipidemia, who has had multiple presentations to the ED over the last month for epigastric pain.    Presented again to the ED today with abdominal pain, nausea, vomiting over the last two weeks with acute worsening in the last 3 to 4 days.   Pain primarily in the epigastrium but does radiate to right upper quadrant. She reports the pain is so bad that she cannot sleep. She endorses persistent bloating and fullness sensation. Right upper quadrant ultrasound in ED reveals cholelithiasis but no acute cholecystitis.  EDP discussed directly with general surgery who has evaluated the patient and recommended HIDA scan for now.    # Recurrent abdominal pain ?Diabetic Gastroparesis - With associated nausea and  vomiting. - CT 11/2 without clear pathology - Right upper quadrant ultrasound with cholelithiasis but no cholecystitis.   HIDA scan: Cystic and CBD are patent, GB EF 47% General surgery recommended no surgical intervention - Suspect part of this may be secondary to diabetic gastroparesis.  Continue reglan . -- consider gastric emptying study as an outpatient, cannot be done as an inpatient. --  CT angio a/p negative for mesenteric ischemia.  Chronic abdominal aortic dissection.   Vascular surgery consulted, recommended follow-up as an outpatient up to 6 months.  No surgical intervention. -- Bentyl  for symptomatic treatment -- PPI for GI prophylax ---received IV fluid for hydration -Continue soft diet -- G.I. consult noted. EGD unremarkable. G.I. feels this could be musculoskeletal from nausea vomiting earlier. Signed off. -- Started on soft carb control diet   # Hypophosphatemia, hypokalemia -- pharmacy to replete   # UTI UA positive, patient was complaining of dysuria Ceftriaxone  x 3 days, last dose on 11/26 Urine culture, growing multiple species suggested recollection     # Type 2 diabetes with peripheral neuropathy, uncontrolled with hyperglycemia, noncompliance to diet - Hemoglobin A1c 12.3% - Started on sliding scale with basal/bolus titration daily -- Diabetes education consult for diet education patient will need-- endocrinology referral as outpatient. Will defer to PCP   # Hypertension - resume home meds    # Atherosclerosis of bilateral lower extremities - Continue home meds   # History of CVA - Continue statin at DC   # B12 deficiency - Continue supplementation   # Hyperlipidemia  - Statin    overall best at baseline D/w niece Erika d/c plans      Consultants: GI Procedures performed: EGD  Disposition: Home Diet recommendation:  Discharge Diet Orders (From admission, onward)     Start     Ordered   01/20/24 0000  Diet Carb Modified        01/20/24 1141            DISCHARGE MEDICATION: Allergies as of 01/20/2024       Reactions   Iodine Other (See Comments)  Convulsions    Fish Allergy Nausea And Vomiting, Other (See Comments)   Convulsions    Amoxicillin Nausea And Vomiting   Lyrica  [pregabalin ] Nausea And Vomiting   Vomiting when taking 50mg  dose, 25mg  tolerated but ineffective        Medication List     TAKE these medications    Accu-Chek Guide Test test strip Generic drug: glucose blood selo  para controlar el nivel de azcar en gap inc o tres veces al da. (Use to check blood sugar two to three times daily)   Accu-Chek Guide w/Device Kit selo para controlar el nivel de azcar en sangre dos o tres veces al da. (Use to check blood glucose two to three times daily)   Accu-Chek Softclix Lancets lancets selo para controlar el nivel de azcar en gap inc o tres veces al da. (Use to check blood glucose two to three times daily)   Aspirin  Low Dose 81 MG tablet Generic drug: aspirin  EC Tome 1 comprimido (81 mg en total) por va oral al da. Trguelo entero. (Take 1 tablet (81 mg total) by mouth daily. Swallow whole.)   atorvastatin  80 MG tablet Commonly known as: LIPITOR Tome 1 tableta (80 mg en total) por va oral diariamente. (Take 1 tablet (80 mg total) by mouth daily.)   cyanocobalamin  1000 MCG tablet Tome 1 tableta (1000 mcg en total) por va oral diariamente. (Take 1 tablet (1,000 mcg total) by mouth daily.)   Dexcom G7 Receiver Devi USE AS DIRECTED DX E11.65   Dexcom G7 Sensor Misc USE AS DIRECTED FOR 10 DAYS   famotidine  20 MG tablet Commonly known as: PEPCID  TAKE 1 TABLET BY MOUTH EVERYDAY AT BEDTIME   feeding supplement (GLUCERNA SHAKE) Liqd Take 237 mLs by mouth 2 (two) times daily.   hydrochlorothiazide  12.5 MG tablet Commonly known as: HYDRODIURIL  TAKE 1 TABLET BY MOUTH EVERY DAY AS NEEDED FOR SWELLING   Icy Hot Max Lidocaine  4-1 % Crea Generic drug: Lidocaine -Menthol Apply 1 Application topically at bedtime.   insulin  glargine 100 UNIT/ML Solostar Pen Commonly known as: LANTUS  Inyectar 20 Unidades en la piel antes de acostarse. (Inject 20 Units into the skin at bedtime.)   Insupen Pen Needles 32G X 4 MM Misc Generic drug: Insulin  Pen Needle selo para tomar inyecciones de insulina. (Used to take insulin  injections)   Jardiance  25 MG Tabs tablet Generic drug: empagliflozin  Take 1 tablet (25 mg total) by mouth daily.    levocetirizine 5 MG tablet Commonly known as: XYZAL Take 5 mg by mouth daily.   lisinopril  20 MG tablet Commonly known as: ZESTRIL  Take 1 tablet (20 mg total) by mouth daily. What changed:  medication strength how much to take   metFORMIN  500 MG tablet Commonly known as: GLUCOPHAGE  Take 2 tablets by mouth in AM   metoCLOPramide  5 MG tablet Commonly known as: REGLAN  Take 1 tablet (5 mg total) by mouth 3 (three) times daily before meals.   ondansetron  4 MG disintegrating tablet Commonly known as: ZOFRAN -ODT Take 1 tablet (4 mg total) by mouth every 8 (eight) hours as needed for nausea or vomiting.   Salonpas Lidocaine  Plus 4-10 % Crea Generic drug: Lidocaine  HCl-Benzyl Alcohol Apply 1 Application topically at bedtime.   traMADol  50 MG tablet Commonly known as: ULTRAM  Take 1/2-1 tablet by mouth twice daily as needed for pain.   Vitamin D -3 25 MCG (1000 UT) Caps Take by mouth daily.        Follow-up Information  Marea Selinda RAMAN, MD Follow up in 4 month(s).   Specialties: Vascular Surgery, Radiology, Interventional Cardiology Why: for smal chronic aortic dissection Contact information: [address] [place] [address] [phone]         Kristina Tinnie POUR, PA-C. Schedule an appointment as soon as possible for a visit in 1 week(s).   Specialty: Physician Assistant Contact information: [address] [place] [address] [phone]         Maryruth Ole DASEN, MD. Schedule an appointment as soon as possible for a visit in 1 week(s).   Specialty: Gastroenterology Why: for Gastirc emptying study and f/u ?gastroparesis Contact information: [address] [place] [address] [phone]                 Fredricka Weights   01/15/24 0625  Weight: 56.1 kg     Condition at discharge: fair  The results of significant diagnostics from this hospitalization (including imaging, microbiology, ancillary and laboratory)  are listed below for reference.   Imaging Studies: CT Angio Abd/Pel w/ and/or w/o Result Date: 01/16/2024 CLINICAL DATA:  Mesenteric ischemia. EXAM: CTA ABDOMEN AND PELVIS WITHOUT AND WITH CONTRAST TECHNIQUE: Multidetector CT imaging of the abdomen and pelvis was performed using the standard protocol during bolus administration of intravenous contrast. Multiplanar reconstructed images and MIPs were obtained and reviewed to evaluate the vascular anatomy. RADIATION DOSE REDUCTION: This exam was performed according to the departmental dose-optimization program which includes automated exposure control, adjustment of the mA and/or kV according to patient size and/or use of iterative reconstruction technique. CONTRAST:  OMNIPAQUE  IOHEXOL  350 MG/ML SOLN COMPARISON:  CT abdomen and pelvis 12/25/2023 FINDINGS: VASCULAR Aorta: Normal caliber aorta without aneurysm. There is questionable small peripheral focal dissection is seen in the infrarenal abdominal aorta measuring 8 mm in length. There is calcified and noncalcified atherosclerotic plaque throughout the aorta. Celiac: Patent without evidence of aneurysm, dissection, vasculitis or significant stenosis. SMA: Patent without evidence of aneurysm, dissection, vasculitis or significant stenosis. Renals: Both renal arteries are patent without evidence of aneurysm, dissection, vasculitis, fibromuscular dysplasia or significant stenosis. IMA: Patent without evidence of aneurysm, dissection, vasculitis or significant stenosis. Inflow: Patent without evidence of aneurysm, dissection, vasculitis or significant stenosis. There is calcified atherosclerotic disease. Proximal Outflow: Bilateral common femoral and visualized portions of the superficial and profunda femoral arteries are patent without evidence of aneurysm, dissection, vasculitis or significant stenosis. Veins: No obvious venous abnormality within the limitations of this arterial phase study. Superior  mesenteric vein and portal vein appear patent. Review of the MIP images confirms the above findings. NON-VASCULAR Lower chest: No acute abnormality. Hepatobiliary: The gallbladder is dilated. Small gallstones are present. There is no surrounding inflammation or ductal dilatation. The liver is within normal limits. Pancreas: Unremarkable. No pancreatic ductal dilatation or surrounding inflammatory changes. Spleen: Normal in size without focal abnormality. Adrenals/Urinary Tract: There some scarring in the superior pole of the left kidney. There is no hydronephrosis or perinephric stranding. The adrenal glands and bladder are within normal limits. Stomach/Bowel: Stomach is within normal limits. Appendix appears normal. No evidence of bowel wall thickening, distention, or inflammatory changes. There is sigmoid colon diverticulosis. Lymphatic: No enlarged lymph nodes are seen. Reproductive: Status post hysterectomy. No adnexal masses. Other: No abdominal wall hernia or abnormality. No abdominopelvic ascites. Musculoskeletal: No acute or significant osseous findings. IMPRESSION: 1. No evidence for mesenteric ischemia. 2. Questionable small focal peripheral dissection in the infrarenal abdominal aorta. 3. Cholelithiasis with dilated gallbladder.  No surrounding inflammation. 4. Colonic diverticulosis. 5. Aortic atherosclerosis. Aortic Atherosclerosis (ICD10-I70.0). Electronically Signed   By: Greig Pique M.D.   On: 01/16/2024 18:38   NM Hepato W/EF Result Date: 01/16/2024 CLINICAL DATA:  Abdominal pain, upper, chronic, assess gallbladder motility EXAM: NUCLEAR MEDICINE HEPATOBILIARY IMAGING WITH GALLBLADDER EF TECHNIQUE: Sequential images of the abdomen were obtained out to 60 minutes following intravenous administration of radiopharmaceutical. After oral ingestion of Ensure, gallbladder ejection fraction was determined. At 60 min, normal ejection fraction is greater than 33%. RADIOPHARMACEUTICALS:  5.25 mCi Tc-48m   Choletec  IV COMPARISON:  Abdominal ultrasound 01/15/2024. Abdominal CT 12/25/2023. FINDINGS: Technical note: Patient became nauseated and vomited after Ensure consumption. Prompt uptake and biliary excretion of activity by the liver is seen. Gallbladder activity is visualized, consistent with patency of cystic duct. Biliary activity passes into small bowel, consistent with patent common bile duct. Calculated gallbladder ejection fraction is 47%. (Normal gallbladder ejection fraction with Ensure is greater than 33% and less than 80%.) IMPRESSION: 1. The cystic and common bile ducts are patent. 2. Gallbladder ejection fraction within normal limits (47%). Electronically Signed   By: Elsie Perone M.D.   On: 01/16/2024 15:23   US  Abdomen Limited RUQ (LIVER/GB) Result Date: 01/15/2024 EXAM: Right Upper Quadrant Abdominal Ultrasound 01/15/2024 08:14:37 AM TECHNIQUE: Real-time ultrasonography of the right upper quadrant of the abdomen was performed. COMPARISON: CT abdomen and pelvis 12/25/2023. CLINICAL HISTORY: Biliary colic. FINDINGS: LIVER: The liver demonstrates normal echogenicity. No intrahepatic biliary ductal dilatation. No evidence of mass. The portal vein is patent with normal flow towards the liver. BILIARY SYSTEM: Sludge is seen layering within the dependent portion of the gallbladder. Gallbladder wall thickness is within normal limits. No pericholecystic fluid or sonographic murphy sign. Common bile duct measures 5.5 mm without intrahepatic bile duct dilatation. No cholelithiasis. OTHER: No right upper quadrant ascites. IMPRESSION: 1. Gallbladder sludge without gallbladder wall thickening, pericholecystic fluid, or sonographic murphy sign. If there is high clinical concern for acute cholecystitis consider further evaluation with follow-up nuclear medicine hepatobiliary scan. Electronically signed by: Waddell Calk MD 01/15/2024 08:23 AM EST RP Workstation: [id number]   CT ABDOMEN PELVIS WO  CONTRAST Result Date: 12/25/2023 EXAM: CT ABDOMEN AND PELVIS WITHOUT CONTRAST 12/25/2023 07:28:32 PM TECHNIQUE: CT of the abdomen and pelvis was performed without the administration of intravenous contrast. Multiplanar reformatted images are provided for review. Automated exposure control, iterative reconstruction, and/or weight-based adjustment of the mA/kV was utilized to reduce the radiation dose to as low as reasonably achievable. COMPARISON: CT abdomen and pelvis 06/30/2023. CLINICAL HISTORY: Abdominal pain, acute, nonlocalized. FINDINGS: LOWER CHEST: No acute abnormality. LIVER: The liver is unremarkable. GALLBLADDER AND BILE DUCTS: Layering hyperdensity within the gallbladder lumen. Myopic gallbladder. No gallbladder wall thickening or pericholecystic fluid. No biliary ductal dilatation. SPLEEN: No acute abnormality. PANCREAS: No acute abnormality. ADRENAL GLANDS: No acute abnormality. KIDNEYS, URETERS AND BLADDER: Punctate bilateral nephrolithiasis. No ureterolithiasis. No hydroureteronephrosis. No perinephric or periureteral stranding. Urinary bladder is unremarkable. GI AND BOWEL: Stomach demonstrates no acute abnormality. No small or large bowel wall thickening. Few scattered colonic diverticula. The appendix is normal in caliber. There is no bowel obstruction. PERITONEUM AND RETROPERITONEUM: No ascites. No free air. VASCULATURE: Aorta is normal in caliber. Moderate-to-severe atherosclerotic plaque. LYMPH NODES: No lymphadenopathy. REPRODUCTIVE ORGANS: Atrophic uterus versus hysterectomy. No adnexal mass. BONES AND SOFT TISSUES: No acute osseous abnormality. Tiny fat-containing umbilical hernia. IMPRESSION: 1. No acute findings in the abdomen or pelvis. 2. Cholelithiasis with nonspecific hydropic gallbladder and no definite  CT findings of acute cholecystitis. 3. Nonobstructive punctate bilateral nephrolithiasis. 4. Tiny fat-containing umbilical hernia. Electronically signed by: Kate Plummer MD  12/25/2023 07:37 PM EST RP Workstation: [id number]    Microbiology: Results for orders placed or performed during the hospital encounter of 01/15/24  Urine Culture (for pregnant, neutropenic or urologic patients or patients with an indwelling urinary catheter)     Status: Abnormal   Collection Time: 01/15/24  6:29 PM   Specimen: Urine, Clean Catch  Result Value Ref Range Status   Specimen Description   Final    URINE, CLEAN CATCH Performed at [facility], [address], [place], [place] [address]    Special Requests   Final    NONE Performed at [facility], [address] [place], [place] [address]    Culture MULTIPLE SPECIES PRESENT, SUGGEST RECOLLECTION (A)  Final   Report Status 01/17/2024 FINAL  Final    Labs: CBC: Recent Labs  Lab 01/17/24 0440 01/18/24 0350  WBC 6.6 5.0  HGB 12.8 11.4*  HCT 38.1 33.8*  MCV 87.8 87.6  PLT 324 284   Basic Metabolic Panel: Recent Labs  Lab 01/17/24 0440 01/18/24 0350 01/18/24 1749 01/19/24 0405 01/20/24 0412  NA 136 138  --  137 139  K 3.6 2.9* 3.2* 4.3 3.4*  CL 103 103  --  105 104  CO2 22 25  --  22 26  GLUCOSE 95 116*  --  186* 140*  BUN 8 7*  --  <5* <5*  CREATININE 0.57 0.62  --  0.66 0.61  CALCIUM  9.0 8.5*  --  9.0 9.1  MG 1.9 1.8  --  2.2 2.1  PHOS 2.3* 3.2  --  1.4* 3.1   CBG: Recent Labs  Lab 01/19/24 0746 01/19/24 1222 01/19/24 1713 01/19/24 2041 01/20/24 0753  GLUCAP 143* 159* 207* 199* 117*    Discharge time spent: greater than 30 minutes.  Signed: Leita Blanch, MD [facility] 01/23/2024

## 2024-01-26 ENCOUNTER — Emergency Department: Admission: EM | Admit: 2024-01-26 | Discharge: 2024-01-26 | Disposition: A | Discharge status: Home / Self Care

## 2024-01-26 ENCOUNTER — Encounter: Payer: Self-pay | Admitting: Physician Assistant

## 2024-01-26 ENCOUNTER — Emergency Department

## 2024-01-26 ENCOUNTER — Other Ambulatory Visit: Payer: Self-pay

## 2024-01-26 ENCOUNTER — Ambulatory Visit: Admitting: Physician Assistant

## 2024-01-26 VITALS — BP 132/121 | HR 97 | Temp 98.0°F | Resp 16 | Ht 59.0 in | Wt 123.0 lb

## 2024-01-26 DIAGNOSIS — I10 Essential (primary) hypertension: Secondary | ICD-10-CM | POA: Diagnosis not present

## 2024-01-26 DIAGNOSIS — E860 Dehydration: Secondary | ICD-10-CM | POA: Diagnosis not present

## 2024-01-26 DIAGNOSIS — E1165 Type 2 diabetes mellitus with hyperglycemia: Secondary | ICD-10-CM | POA: Insufficient documentation

## 2024-01-26 DIAGNOSIS — R1013 Epigastric pain: Secondary | ICD-10-CM | POA: Insufficient documentation

## 2024-01-26 DIAGNOSIS — R112 Nausea with vomiting, unspecified: Secondary | ICD-10-CM | POA: Diagnosis present

## 2024-01-26 DIAGNOSIS — G8929 Other chronic pain: Secondary | ICD-10-CM | POA: Insufficient documentation

## 2024-01-26 DIAGNOSIS — I7102 Dissection of abdominal aorta: Secondary | ICD-10-CM

## 2024-01-26 DIAGNOSIS — R739 Hyperglycemia, unspecified: Secondary | ICD-10-CM

## 2024-01-26 DIAGNOSIS — I152 Hypertension secondary to endocrine disorders: Secondary | ICD-10-CM

## 2024-01-26 DIAGNOSIS — K3184 Gastroparesis: Secondary | ICD-10-CM

## 2024-01-26 DIAGNOSIS — E1159 Type 2 diabetes mellitus with other circulatory complications: Secondary | ICD-10-CM

## 2024-01-26 LAB — URINALYSIS, ROUTINE W REFLEX MICROSCOPIC
Bilirubin Urine: NEGATIVE
Glucose, UA: >=500 mg/dL — AB
Hgb urine dipstick: NEGATIVE
Ketones, ur: 5 mg/dL — AB
Nitrite: NEGATIVE
Protein, ur: 100 mg/dL — AB
Specific Gravity, Urine: 1.03 (ref 1.005–1.030)
pH: 6 (ref 5.0–8.0)

## 2024-01-26 LAB — COMPREHENSIVE METABOLIC PANEL WITH GFR
ALT: 32 U/L (ref 0–44)
AST: 32 U/L (ref 15–41)
Albumin: 4.4 g/dL (ref 3.5–5.0)
Alkaline Phosphatase: 116 U/L (ref 38–126)
Anion gap: 13 (ref 5–15)
BUN: 19 mg/dL (ref 8–23)
CO2: 27 mmol/L (ref 22–32)
Calcium: 10.3 mg/dL (ref 8.9–10.3)
Chloride: 94 mmol/L — ABNORMAL LOW (ref 98–111)
Creatinine, Ser: 0.85 mg/dL (ref 0.44–1.00)
GFR, Estimated: >60 mL/min (ref >60)
Glucose, Bld: 387 mg/dL — ABNORMAL HIGH (ref 70–99)
Potassium: 4.1 mmol/L (ref 3.5–5.1)
Sodium: 134 mmol/L — ABNORMAL LOW (ref 135–145)
Total Bilirubin: 0.6 mg/dL (ref 0.0–1.2)
Total Protein: 8 g/dL (ref 6.5–8.1)

## 2024-01-26 LAB — TROPONIN T, HIGH SENSITIVITY
Troponin T High Sensitivity: <15 ng/L (ref 0–19)
Troponin T High Sensitivity: <15 ng/L (ref 0–19)

## 2024-01-26 LAB — CBC
HCT: 44.9 % (ref 36.0–46.0)
Hemoglobin: 14.2 g/dL (ref 12.0–15.0)
MCH: 30.2 pg (ref 26.0–34.0)
MCHC: 31.6 g/dL (ref 30.0–36.0)
MCV: 95.5 fL (ref 80.0–100.0)
Platelets: 363 K/uL (ref 150–400)
RBC: 4.7 MIL/uL (ref 3.87–5.11)
RDW: 13.4 % (ref 11.5–15.5)
WBC: 6.9 K/uL (ref 4.0–10.5)
nRBC: 0 % (ref 0.0–0.2)

## 2024-01-26 LAB — PHOSPHORUS: Phosphorus: 3.3 mg/dL (ref 2.5–4.6)

## 2024-01-26 LAB — CBG MONITORING, ED: Glucose-Capillary: 225 mg/dL — ABNORMAL HIGH (ref 70–99)

## 2024-01-26 MED ORDER — INSULIN ASPART 100 UNIT/ML IJ SOLN: 0.0000 [IU] | Freq: Every day | INTRAMUSCULAR | Status: DC

## 2024-01-26 MED ORDER — INSULIN ASPART 100 UNIT/ML IJ SOLN: 0.0000 [IU] | Freq: Three times a day (TID) | INTRAMUSCULAR | Status: DC

## 2024-01-26 MED ORDER — ONDANSETRON HCL 4 MG/2ML IJ SOLN
4.0000 mg | Freq: Once | INTRAMUSCULAR | Status: AC
  Administered 2024-01-26: 4 mg via INTRAVENOUS
  Filled 2024-01-26: qty 2

## 2024-01-26 MED ORDER — ALUM & MAG HYDROXIDE-SIMETH 200-200-20 MG/5ML PO SUSP
30.0000 mL | Freq: Once | ORAL | Status: AC
  Administered 2024-01-26: 30 mL via ORAL
  Filled 2024-01-26: qty 30

## 2024-01-26 MED ORDER — SODIUM CHLORIDE 0.9 % IV BOLUS
1000.0000 mL | Freq: Once | INTRAVENOUS | Status: AC
  Administered 2024-01-26: 1000 mL via INTRAVENOUS

## 2024-01-26 MED ORDER — PROMETHAZINE HCL 25 MG RE SUPP: 25.0000 mg | Freq: Four times a day (QID) | RECTAL | 0 refills | Status: DC | PRN

## 2024-01-26 NOTE — ED Provider Notes (Signed)
 [facility] Provider Note    Event Date/Time   First MD Initiated Contact with Patient 01/26/24 1642     (approximate)   History   Emesis and Hypertension   HPI  Karlena Luebke is a 73 y.o. female  with pmh of type 2 diabetes with peripheral neuropathy, hypertension, atherosclerosis of bilateral lower extremities, history of CVA, B12 deficiency, thyroid disorder, hyperlipidemia, and recent hospitalization from 11/23-12/01 for recurrent abdominal pain.  Patient presents with her nieces who states that the patient has had decreased p.o. this week since discharge.  They state that after consuming any fluids or solids, 15 to 20 minutes afterwards she has some nausea.  Patient went to see her primary care physician and endorsed ongoing nausea and vomiting and concern for dehydration and was sent back to our facility with concern for dehydration.  Patient just feels slightly weak.  Denies any chest pain or shortness of breath.  Has had no fevers.  Last bowel movement was this morning and was normal.  She is at her baseline mental status per niece is at bedside      Physical Exam   Triage Vital Signs: ED Triage Vitals  Encounter Vitals Group     BP 01/26/24 1550 (!) 120/53     Girls Systolic BP Percentile --      Girls Diastolic BP Percentile --      Boys Systolic BP Percentile --      Boys Diastolic BP Percentile --      Pulse Rate 01/26/24 1550 88     Resp 01/26/24 1550 20     Temp 01/26/24 1550 98.1 F (36.7 C)     Temp Source 01/26/24 1550 Oral     SpO2 01/26/24 1550 100 %     Weight 01/26/24 1552 130 lb (59 kg)     Height 01/26/24 1552 4' 11 (1.499 m)     Head Circumference --      Peak Flow --      Pain Score 01/26/24 1556 10     Pain Loc --      Pain Education --      Exclude from Growth Chart --     Most recent vital signs: Vitals:   01/26/24 1900 01/26/24 1930  BP: (!) 158/71 (!) 151/60  Pulse: 66 68  Resp: 20 (!) 23  Temp:    SpO2:  100% 100%    Nursing Triage Note reviewed. Vital signs reviewed and patients oxygen saturation is normoxic  General: Patient is well nourished, well developed, awake and alert, resting comfortably in no acute distress Head: Normocephalic and atraumatic Eyes: Normal inspection, extraocular muscles intact, no conjunctival pallor Ear, nose, throat: Normal external exam Neck: Normal range of motion Respiratory: Patient is in no respiratory distress, lungs CTAB Cardiovascular: Patient is not tachycardic, RRR without murmur appreciated GI: Abd Soft, very mild tenderness to palpation in the epigastrium with no guarding or rebound  Back: Normal inspection of the back with good strength and range of motion throughout all ext Extremities: pulses intact with good cap refills, no LE pitting edema or calf tenderness Neuro: The patient is alert and oriented to person, place, and time, appropriately conversive, with 5/5 bilat UE/LE strength, no gross motor or sensory defects noted. Coordination appears to be adequate. Skin: Warm, dry, and intact Psych: normal mood and affect, no SI or HI  ED Results / Procedures / Treatments   Labs (all labs ordered are listed, but only abnormal  results are displayed) Labs Reviewed  COMPREHENSIVE METABOLIC PANEL WITH GFR - Abnormal; Notable for the following components:      Result Value   Sodium 134 (*)    Chloride 94 (*)    Glucose, Bld 387 (*)    All other components within normal limits  URINALYSIS, ROUTINE W REFLEX MICROSCOPIC - Abnormal; Notable for the following components:   Color, Urine YELLOW (*)    APPearance HAZY (*)    Glucose, UA >=500 (*)    Ketones, ur 5 (*)    Protein, ur 100 (*)    Leukocytes,Ua SMALL (*)    Bacteria, UA RARE (*)    All other components within normal limits  CBG MONITORING, ED - Abnormal; Notable for the following components:   Glucose-Capillary 225 (*)    All other components within normal limits  CBC  PHOSPHORUS   URINALYSIS, COMPLETE (UACMP) WITH MICROSCOPIC  TROPONIN T, HIGH SENSITIVITY  TROPONIN T, HIGH SENSITIVITY     EKG EKG and rhythm strip are interpreted by myself:   EKG: [Normal sinus rhythm] at heart rate of 88, normal QRS duration, QTc 474, normal ST segments and T waves no ectopy EKG not consistent with Acute STEMI Rhythm strip: NSR in lead II   RADIOLOGY CXR: No evidence of pneumonia or aspiration on my independent review interpretation radiologist agrees    PROCEDURES:  Critical Care performed: No  Procedures   MEDICATIONS ORDERED IN ED: Medications  insulin  aspart (novoLOG ) injection 0-15 Units (has no administration in time range)  insulin  aspart (novoLOG ) injection 0-5 Units (has no administration in time range)  ondansetron  (ZOFRAN ) injection 4 mg (4 mg Intravenous Given 01/26/24 1710)  alum & mag hydroxide-simeth (MAALOX/MYLANTA) 200-200-20 MG/5ML suspension 30 mL (30 mLs Oral Given 01/26/24 1710)  sodium chloride  0.9 % bolus 1,000 mL (0 mLs Intravenous Stopped 01/26/24 1932)     IMPRESSION / MDM / ASSESSMENT AND PLAN / ED COURSE                                Differential diagnosis includes, but is not limited to, acute on chronic epigastric abdominal pain, DKA, acute renal insufficiency, electrolyte derangement,   ED course: Patient presents and abdomen demonstrates no evidence of peritonitis and really epigastric tenderness seems to be distractible.  EKG demonstrated no evidence of acute ischemia (taken for evidence of electrolyte derangements or possible atypical ACS).  Patient had no acute renal insufficiency, no profound electrolyte derangements except for her glucose being elevated however her CO2 was not deviated and she had no anion gap.  She did receive 1 L of this and was placed on a sliding scale in our ED. chest x-ray demonstrated no evidence of aspiration pneumonia.  Urinalysis is pending however if this is unremarkable patient can likely return home.   I will send her with some Phenergan  suppositories.  She will need close primary care physician follow-up for management of her longstanding diabetes.  Given her gastroparesis, I will give her the information to GI as well   Clinical Course as of 01/27/24 0009  Thu Jan 26, 2024  1727 Anion gap: 13 Anion gap not elevated [HD]  1736 Anion gap: 13 No anion gap [HD]  1826 DG Chest 2 View No acute abnormality [HD]  1903 Ketones, ur(!): 5 Only very mild ketones [HD]  1904 WBC, UA: 21-50 Seems stable for the patient [HD]  1904 Glucose-Capillary(!): 225 Improved [HD]  1907 I reviewed the workup completed today with the patient's niece and the patient, both voiced some frustration that a cause for her ongoing nausea and vomiting has not been found.  However I did answer all the questions to the best of my ability.  They do have a GI appointment already scheduled.  All questions answered and patient voiced understanding and requested discharge [HD]    Clinical Course User Index [HD] Nicholaus Rolland BRAVO, MD   At time of discharge there is no evidence of acute life, limb, vision, or fertility threat. Patient has stable vital signs, pain is well controlled, patient is ambulatory and p.o. tolerant.  Discharge instructions were completed using the EPIC system. I would refer you to those at this time. All warnings prescriptions follow-up etc. were discussed in detail with the patient. Patient indicates understanding and is agreeable with this plan. All questions answered.  Patient is made aware that they may return to the emergency department for any worsening or new condition or for any other emergency.   -- Risk: 5 This patient has a high risk of morbidity due to further diagnostic testing or treatment. Rationale: This patient's evaluation and management involve a high risk of morbidity due to the potential severity of presenting symptoms, need for diagnostic testing, and/or initiation of treatment  that may require close monitoring. The differential includes conditions with potential for significant deterioration or requiring escalation of care. Treatment decisions in the ED, including medication administration, procedural interventions, or disposition planning, reflect this level of risk. COPA: 5 The patient has the following acute or chronic illness/injury that poses a possible threat to life or bodily function: [X] : The patient has a potentially serious acute condition or an acute exacerbation of a chronic illness requiring urgent evaluation and management in the Emergency Department. The clinical presentation necessitates immediate consideration of life-threatening or function-threatening diagnoses, even if they are ultimately ruled out.   FINAL CLINICAL IMPRESSION(S) / ED DIAGNOSES   Final diagnoses:  Nausea and vomiting, unspecified vomiting type  Chronic abdominal pain  Hyperglycemia     Rx / DC Orders   ED Discharge Orders          Ordered    promethazine  (PHENERGAN ) 25 MG suppository  Every 6 hours PRN        01/26/24 1845             Note:  This document was prepared using Dragon voice recognition software and may include unintentional dictation errors.   Nicholaus Rolland BRAVO, MD 01/27/24 [phone]

## 2024-01-26 NOTE — Discharge Instructions (Addendum)
 You were seen in our emergency department for ongoing nausea and vomiting and abdominal pain.  Workup today was reassuring.  Please call your primary care physician back and let her know and determine whether you need outpatient insulin /sliding scale for better glucose control.  Please call and make an appointment with gastroenterology.  Continue your regular medications.  Fluids are more important than solids.  Return with any acutely worsening symptoms or any other emergency. -- RETURN PRECAUTIONS & AFTERCARE: (ENGLISH) RETURN PRECAUTIONS: Return immediately to the emergency department or see/call your doctor if you feel worse, weak or have changes in speech or vision, are short of breath, have fever, vomiting, pain, bleeding or dark stool, trouble urinating or any new issues. Return here or see/call your doctor if not improving as expected for your suspected condition. FOLLOW-UP CARE: Call your doctor and/or any doctors we referred you to for more advice and to make an appointment. Do this today, tomorrow or after the weekend. Some doctors only take PPO insurance so if you have HMO insurance you may want to contact your HMO or your regular doctor for referral to a specialist within your plan. Either way tell the doctor's office that it was a referral from the emergency department so you get the soonest possible appointment.  YOUR TEST RESULTS: Take result reports of any blood or urine tests, imaging tests and EKG's to your doctor and any referral doctor. Have any abnormal tests repeated. Your doctor or a referral doctor can let you know when this should be done. Also make sure your doctor contacts this hospital to get any test results that are not currently available such as cultures or special tests for infection and final imaging reports, which are often not available at the time you leave the ER but which may list additional important findings that are not documented on the preliminary report. BLOOD  PRESSURE: If your blood pressure was greater than 120/80 have your blood pressure rechecked within 1 to 2 weeks. MEDICATION SIDE EFFECTS: Do not drive, walk, bike, take the bus, etc. if you have received or are being prescribed any sedating medications such as those for pain or anxiety or certain antihistamines like Benadryl . If you have been give one of these here get a taxi home or have a friend drive you home. Ask your pharmacist to counsel you on potential side effects of any new medication

## 2024-01-26 NOTE — Progress Notes (Signed)
 [facility] [address] [place], [place] [address]  Internal MEDICINE  Office Visit Note  Patient Name: Meredith Martinez  [id number]  [id number]  Date of Service: 01/26/2024     Chief Complaint  Patient presents with   Hospitalization Follow-up    Pt has small tear in stomach aorta. Has to follow-up with GI, has been vomiting for a couple of weeks. All tests show gallbladder is working.      HPI Pt is here for recent hospital follow up. -went to ED11/23 with abdominal pain, N/V for second time and was admitted until 11/28 -concern for diabetic gastroparesis as GB showed cholelithiasis but no cholecystitis and EF 47% on HIDA -Plan for GI OP to have gastric emptying study, has not been scheduled yet -also found to have chronic abdominal aortic dissection and vascular consulted and advised to have OP follow up with vascular in a few months -She was given Bentyl , reglan , PPI, IV fluids -A1c still very uncontrolled despite medication adjustments, likely will need sliding scale -Today in office she does not feel any better. She is fighting to keep protein shake down in office now, states since leaving the hospital she has not been able to keep any food or liquids down and can't keep her meds in her system either including nausea meds. She is shaking in office and BP/HR elevated  Current Medication: Outpatient Encounter Medications as of 01/26/2024  Medication Sig   Accu-Chek Softclix Lancets lancets Use to check blood glucose two to three times daily   aspirin  EC 81 MG tablet Take 1 tablet (81 mg total) by mouth daily. Swallow whole.   atorvastatin  (LIPITOR) 80 MG tablet Take 1 tablet (80 mg total) by mouth daily.   Blood Glucose Monitoring Suppl (ACCU-CHEK GUIDE) w/Device KIT Use to check blood glucose two to three times daily   Cholecalciferol (VITAMIN D -3) 1000 units CAPS Take by mouth daily.   Continuous Glucose Receiver (DEXCOM G7 RECEIVER) DEVI USE AS DIRECTED DX  E11.65   Continuous Glucose Sensor (DEXCOM G7 SENSOR) MISC USE AS DIRECTED FOR 10 DAYS   cyanocobalamin  1000 MCG tablet Take 1 tablet (1,000 mcg total) by mouth daily.   famotidine  (PEPCID ) 20 MG tablet TAKE 1 TABLET BY MOUTH EVERYDAY AT BEDTIME   feeding supplement, GLUCERNA SHAKE, (GLUCERNA SHAKE) LIQD Take 237 mLs by mouth 2 (two) times daily.   glucose blood test strip Use to check blood sugar two to three times daily   hydrochlorothiazide  (HYDRODIURIL ) 12.5 MG tablet TAKE 1 TABLET BY MOUTH EVERY DAY AS NEEDED FOR SWELLING   insulin  glargine (LANTUS ) 100 UNIT/ML Solostar Pen Inject 20 Units into the skin at bedtime.   Insulin  Pen Needle (PEN NEEDLES) 32G X 4 MM MISC Used to take insulin  injections   JARDIANCE  25 MG TABS tablet Take 1 tablet (25 mg total) by mouth daily.   levocetirizine (XYZAL) 5 MG tablet Take 5 mg by mouth daily.   Lidocaine  HCl-Benzyl Alcohol (SALONPAS LIDOCAINE  PLUS) 4-10 % CREA Apply 1 Application topically at bedtime.   Lidocaine -Menthol (ICY HOT MAX LIDOCAINE ) 4-1 % CREA Apply 1 Application topically at bedtime.   lisinopril  (ZESTRIL ) 20 MG tablet Take 1 tablet (20 mg total) by mouth daily.   metFORMIN  (GLUCOPHAGE ) 500 MG tablet Take 2 tablets by mouth in AM   metoCLOPramide  (REGLAN ) 5 MG tablet Take 1 tablet (5 mg total) by mouth 3 (three) times daily before meals.   ondansetron  (ZOFRAN -ODT) 4 MG disintegrating tablet Take 1 tablet (4 mg  total) by mouth every 8 (eight) hours as needed for nausea or vomiting.   traMADol  (ULTRAM ) 50 MG tablet Take 1/2-1 tablet by mouth twice daily as needed for pain.   No facility-administered encounter medications on file as of 01/26/2024.    Surgical History: Past Surgical History:  Procedure Laterality Date   BREAST BIOPSY Right    neg   ESOPHAGOGASTRODUODENOSCOPY N/A 01/18/2024   Procedure: EGD (ESOPHAGOGASTRODUODENOSCOPY);  Surgeon: Jinny Carmine, MD;  Location: [facility] ENDOSCOPY;  Service: Endoscopy;  Laterality: N/A;    ESOPHAGOGASTRODUODENOSCOPY (EGD) WITH PROPOFOL  N/A 06/27/2017   Procedure: ESOPHAGOGASTRODUODENOSCOPY (EGD) WITH PROPOFOL ;  Surgeon: Therisa Bi, MD;  Location: [facility] ENDOSCOPY;  Service: Gastroenterology;  Laterality: N/A;    Medical History: Past Medical History:  Diagnosis Date   Diabetes mellitus without complication (HCC)    niddm   Stroke (HCC)     Family History: Family History  Problem Relation Age of Onset   Heart disease Mother    Diabetes Mother    Arthritis Mother    Cancer Mother    Breast cancer Mother 40   Heart disease Father    Stroke Father    Diabetes Father     Social History   Socioeconomic History   Marital status: Single    Spouse name: Not on file   Number of children: Not on file   Years of education: Not on file   Highest education level: Not on file  Occupational History   Not on file  Tobacco Use   Smoking status: Former    Current packs/day: 0.00    Types: Cigarettes    Quit date: 2008    Years since quitting: 17.9   Smokeless tobacco: Never  Substance and Sexual Activity   Alcohol use: No   Drug use: No   Sexual activity: Not Currently  Other Topics Concern   Not on file  Social History Narrative   Not on file   Social Drivers of Health   Financial Resource Strain: Not on file  Food Insecurity: No Food Insecurity (01/15/2024)   Hunger Vital Sign    Worried About Running Out of Food in the Last Year: Never true    Ran Out of Food in the Last Year: Never true  Transportation Needs: No Transportation Needs (01/15/2024)   PRAPARE - Administrator, Civil Service (Medical): No    Lack of Transportation (Non-Medical): No  Physical Activity: Not on file  Stress: Not on file  Social Connections: Moderately Isolated (01/15/2024)   Social Connection and Isolation Panel    Frequency of Communication with Friends and Family: More than three times a week    Frequency of Social Gatherings with Friends and Family: More than  three times a week    Attends Religious Services: 1 to 4 times per year    Active Member of [employer] or Organizations: No    Attends Banker Meetings: Never    Marital Status: Divorced  Catering Manager Violence: Not At Risk (01/15/2024)   Humiliation, Afraid, Rape, and Kick questionnaire    Fear of Current or Ex-Partner: No    Emotionally Abused: No    Physically Abused: No    Sexually Abused: No      Review of Systems  Constitutional:  Positive for appetite change and fatigue.  HENT:  Positive for postnasal drip. Negative for rhinorrhea.   Eyes:  Negative for redness.  Respiratory:  Negative for chest tightness and shortness of breath.   Cardiovascular:  Negative for chest pain and palpitations.  Gastrointestinal:  Positive for abdominal pain, nausea and vomiting.  Musculoskeletal:  Positive for arthralgias, gait problem and myalgias.  Neurological:  Positive for weakness and numbness.  Hematological:  Negative for adenopathy. Does not bruise/bleed easily.  Psychiatric/Behavioral:  Positive for sleep disturbance. Negative for behavioral problems (Depression) and suicidal ideas. The patient is not nervous/anxious.     Vital Signs: BP (!) 132/121   Pulse 97   Temp 98 F (36.7 C)   Resp 16   Ht 4' 11 (1.499 m)   Wt 123 lb (55.8 kg)   SpO2 100%   BMI 24.84 kg/m    Physical Exam Vitals and nursing note reviewed.  Constitutional:      Appearance: She is well-developed. She is ill-appearing.     Comments: Patient is ill appearing and shaking in office, with bag for vomiting due to constant nausea  HENT:     Head: Normocephalic and atraumatic.  Eyes:     Extraocular Movements: Extraocular movements intact.  Neck:     Thyroid: No thyromegaly.     Vascular: No JVD.     Trachea: No tracheal deviation.  Cardiovascular:     Rate and Rhythm: Regular rhythm. Tachycardia present.     Heart sounds: Normal heart sounds. No murmur heard.    No friction rub. No  gallop.  Pulmonary:     Effort: Pulmonary effort is normal. No respiratory distress.     Breath sounds: No wheezing.  Abdominal:     Tenderness: There is abdominal tenderness.  Skin:    Coloration: Skin is pale.  Neurological:     Mental Status: She is alert.     Sensory: Sensory deficit present.     Motor: Weakness present.     Gait: Gait abnormal.  Psychiatric:        Behavior: Behavior normal.        Thought Content: Thought content normal.        Judgment: Judgment normal.       Assessment/Plan: 1. Nausea and vomiting, unspecified vomiting type (Primary) Unable to keep any food, fluids or medications in her system. Advised to go back ED due to concern for dehydration and malnutrition  2. Dehydration Unable to keep any food, fluids or medications in her system. Advised to go back ED due to concern for dehydration and malnutrition  3. Abdominal pain, epigastric Will go back to ED given acute symptoms and will need GI OP  4. Gastroparesis Likely contributing cause and will have GI OP  5. Hypertension associated with type 2 diabetes mellitus (HCC) Elevated in office along with HR and unable to take medications. Will go to ED  6. Dissection of aorta, abdominal (HCC) Chronic and vascular evaluated and will follow up OP   General Counseling: Kevina verbalizes understanding of the findings of todays visit and agrees with plan of treatment. I have discussed any further diagnostic evaluation that may be needed or ordered today. We also reviewed her medications today. she has been encouraged to call the office with any questions or concerns that should arise related to todays visit.    Counseling:    No orders of the defined types were placed in this encounter.   This patient was seen by Tinnie Pro, PA-C in collaboration with Dr. Sigrid Bathe as a part of collaborative care agreement.   I have reviewed all medical records from hospital follow up including  radiology reports and consults from other physicians. Appropriate  follow up diagnostics will be scheduled as needed. Patient/ Family understands the plan of treatment. Time spent 40 minutes.   Dr Sigrid CHRISTELLA Bathe, MD Internal Medicine

## 2024-01-26 NOTE — ED Triage Notes (Signed)
 Pt to ED via POV from home. Pt reports N/V and abd pain that has been ongoing since she was seen and admitted here on 11/23. Pt denies SOB. Pt reports centralized CP.   Hx of DM and CVA

## 2024-01-26 NOTE — ED Notes (Addendum)
 Pt assisted to bed without incident

## 2024-01-26 NOTE — ED Notes (Signed)
 ED RN taking over care of pt after receiving handoff. Pt initially came to ED with persistent nausea and vomiting that has not improved, as well as abdominal pain. Pt resting comfortably in ED bed at this time. Pt ABCs intact. RR even and unlabored. Pt in NAD. Bed in lowest locked position. Call bell in reach. Denies needs at this time.   Past Medical History:  Diagnosis Date   Diabetes mellitus without complication (HCC)    niddm   Stroke (HCC)

## 2024-01-26 NOTE — ED Notes (Signed)
 Pt discharged to ED circle at this time in wheelchair and left with all belongings. Pt ABCs intact. RR even and unlabored. Pt in NAD. Pt denies further needs from this RN.

## 2024-01-31 ENCOUNTER — Telehealth: Payer: Self-pay | Admitting: Physician Assistant

## 2024-01-31 NOTE — Telephone Encounter (Signed)
 S/w niece, Bernice regarding ED follow up. She stated she will be taking patient to [facility] since patient has been vomiting for over a month-Toni

## 2024-02-02 ENCOUNTER — Telehealth: Payer: Self-pay | Admitting: Physician Assistant

## 2024-02-02 ENCOUNTER — Other Ambulatory Visit: Payer: Self-pay | Admitting: Physician Assistant

## 2024-02-02 DIAGNOSIS — K3184 Gastroparesis: Secondary | ICD-10-CM

## 2024-02-02 DIAGNOSIS — R1013 Epigastric pain: Secondary | ICD-10-CM

## 2024-02-02 DIAGNOSIS — R112 Nausea with vomiting, unspecified: Secondary | ICD-10-CM

## 2024-02-02 NOTE — Telephone Encounter (Signed)
 Urgent GI referral sent via Epic to [facility]. Notified patient's niece. Gave telephone # [phone]

## 2024-02-02 NOTE — Telephone Encounter (Signed)
 Left vm w/ niece to schedule ED follow up-Toni

## 2024-02-05 ENCOUNTER — Other Ambulatory Visit: Payer: Self-pay | Admitting: Physician Assistant

## 2024-02-05 DIAGNOSIS — M79606 Pain in leg, unspecified: Secondary | ICD-10-CM

## 2024-02-21 ENCOUNTER — Telehealth: Payer: Self-pay | Admitting: Physician Assistant

## 2024-02-21 NOTE — Telephone Encounter (Signed)
 Left vm with niece to schedule wellness visit-Toni

## 2024-02-27 ENCOUNTER — Encounter: Payer: Self-pay | Admitting: Physician Assistant

## 2024-02-27 ENCOUNTER — Ambulatory Visit: Admitting: Physician Assistant

## 2024-02-27 VITALS — BP 110/60 | HR 83 | Temp 98.0°F | Resp 16 | Ht 59.0 in | Wt 118.8 lb

## 2024-02-27 DIAGNOSIS — E1142 Type 2 diabetes mellitus with diabetic polyneuropathy: Secondary | ICD-10-CM | POA: Diagnosis not present

## 2024-02-27 DIAGNOSIS — R300 Dysuria: Secondary | ICD-10-CM | POA: Diagnosis not present

## 2024-02-27 DIAGNOSIS — M797 Fibromyalgia: Secondary | ICD-10-CM

## 2024-02-27 DIAGNOSIS — K3184 Gastroparesis: Secondary | ICD-10-CM | POA: Diagnosis not present

## 2024-02-27 DIAGNOSIS — Z0001 Encounter for general adult medical examination with abnormal findings: Secondary | ICD-10-CM

## 2024-02-27 MED ORDER — DULOXETINE HCL 20 MG PO CPEP: 20.0000 mg | ORAL_CAPSULE | Freq: Every day | ORAL | 3 refills | Status: AC

## 2024-02-27 NOTE — Progress Notes (Signed)
 [facility] [address] [place], [place] [address]  Internal MEDICINE  Office Visit Note  Patient Name: Meredith Martinez  [id number]  [id number]  Date of Service: 02/27/2024  Chief Complaint  Patient presents with   Medicare Wellness    Pt to see GI on 03/20/24.   Diabetes   Quality Metric Gaps    Eye Exam, Mammogram, Dexa Scan    HPI Ruchel presents for an annual well visit  -patient continues to deal with Nausea/vomiting and abdominal discomfort. She has been to ED several times for this and has been determined that it is likely due to gastroparesis. She has appt with GI in a few weeks to address further -able to eat a little with more bland diet; more mobile since sugar improving.  -able to do glucerna for protein, some chicken noodle soup -neurology in March, and will discuss significant neuropathy. Tramadol  as needed, helps sometimes. Does have fibromyalgia as well -insulin  now 25units daily, sugars improving and reports 98-117 despite not being able to take any of her oral DM meds. Insulin  alone now. Unable to tolerate Dexcom unfortunately -mom died last week and is managing at this time, states her dog Rico is helping but mood has been down with all her health concerns -will discuss screening tests once patient feeling better     02/27/2024    2:15 PM  MMSE - Mini Mental State Exam  Orientation to time 5  Orientation to Place 5  Registration 3  Attention/ Calculation 5  Recall 3  Language- name 2 objects 2  Language- repeat 1  Language- follow 3 step command 3  Language- read & follow direction 1  Write a sentence 1  Copy design 1  Total score 30    Functional Status Survey: Is the patient deaf or have difficulty hearing?: No Does the patient have difficulty seeing, even when wearing glasses/contacts?: No Does the patient have difficulty concentrating, remembering, or making decisions?: No Does the patient have difficulty walking or climbing  stairs?: Yes Does the patient have difficulty dressing or bathing?: No Does the patient have difficulty doing errands alone such as visiting a doctor's office or shopping?: Yes     01/20/2022   12:59 PM 07/14/2023   11:10 AM 10/10/2023   11:48 AM 12/29/2023    3:46 PM 02/27/2024    2:15 PM  Fall Risk  Falls in the past year? 0 1 0 1 1  Was there an injury with Fall?  1   0  0  Fall Risk Category Calculator  2  2 2   (RETIRED) Patient Fall Risk Level Low fall risk       Patient at Risk for Falls Due to No Fall Risks History of fall(s)     Fall risk Follow up Falls evaluation completed  Falls evaluation completed        Data saved with a previous flowsheet row definition       02/27/2024    2:15 PM  Depression screen PHQ 2/9  Decreased Interest 0  Down, Depressed, Hopeless 0  PHQ - 2 Score 0        No data to display            Current Medication: Outpatient Encounter Medications as of 02/27/2024  Medication Sig   Accu-Chek Softclix Lancets lancets Use to check blood glucose two to three times daily   Continuous Glucose Receiver (DEXCOM G7 RECEIVER) DEVI USE AS DIRECTED DX E11.65   Continuous Glucose  Sensor (DEXCOM G7 SENSOR) MISC USE AS DIRECTED FOR 10 DAYS   DULoxetine  (CYMBALTA ) 20 MG capsule Take 1 capsule (20 mg total) by mouth daily.   feeding supplement, GLUCERNA SHAKE, (GLUCERNA SHAKE) LIQD Take 237 mLs by mouth 2 (two) times daily.   glucose blood test strip Use to check blood sugar two to three times daily   insulin  glargine (LANTUS ) 100 UNIT/ML Solostar Pen Inject 20 Units into the skin at bedtime.   Insulin  Pen Needle (PEN NEEDLES) 32G X 4 MM MISC Used to take insulin  injections   ondansetron  (ZOFRAN -ODT) 4 MG disintegrating tablet Take 1 tablet (4 mg total) by mouth every 8 (eight) hours as needed for nausea or vomiting.   traMADol  (ULTRAM ) 50 MG tablet Take 1/2-1 tablet by mouth twice daily as needed for pain.   [DISCONTINUED] aspirin  EC 81 MG tablet Take 1  tablet (81 mg total) by mouth daily. Swallow whole.   [DISCONTINUED] atorvastatin  (LIPITOR) 80 MG tablet Take 1 tablet (80 mg total) by mouth daily.   [DISCONTINUED] Blood Glucose Monitoring Suppl (ACCU-CHEK GUIDE) w/Device KIT Use to check blood glucose two to three times daily   [DISCONTINUED] Cholecalciferol (VITAMIN D -3) 1000 units CAPS Take by mouth daily.   [DISCONTINUED] cyanocobalamin  1000 MCG tablet Take 1 tablet (1,000 mcg total) by mouth daily.   [DISCONTINUED] famotidine  (PEPCID ) 20 MG tablet TAKE 1 TABLET BY MOUTH EVERYDAY AT BEDTIME   [DISCONTINUED] hydrochlorothiazide  (HYDRODIURIL ) 12.5 MG tablet TAKE 1 TABLET BY MOUTH EVERY DAY AS NEEDED FOR SWELLING   [DISCONTINUED] JARDIANCE  25 MG TABS tablet Take 1 tablet (25 mg total) by mouth daily.   [DISCONTINUED] levocetirizine (XYZAL) 5 MG tablet Take 5 mg by mouth daily.   [DISCONTINUED] Lidocaine  HCl-Benzyl Alcohol (SALONPAS LIDOCAINE  PLUS) 4-10 % CREA Apply 1 Application topically at bedtime.   [DISCONTINUED] Lidocaine -Menthol (ICY HOT MAX LIDOCAINE ) 4-1 % CREA Apply 1 Application topically at bedtime.   [DISCONTINUED] lisinopril  (ZESTRIL ) 20 MG tablet Take 1 tablet (20 mg total) by mouth daily.   [DISCONTINUED] metFORMIN  (GLUCOPHAGE ) 500 MG tablet Take 2 tablets by mouth in AM   [DISCONTINUED] metoCLOPramide  (REGLAN ) 5 MG tablet Take 1 tablet (5 mg total) by mouth 3 (three) times daily before meals.   [DISCONTINUED] promethazine  (PHENERGAN ) 25 MG suppository Place 1 suppository (25 mg total) rectally every 6 (six) hours as needed for nausea or vomiting.   No facility-administered encounter medications on file as of 02/27/2024.    Surgical History: Past Surgical History:  Procedure Laterality Date   BREAST BIOPSY Right    neg   ESOPHAGOGASTRODUODENOSCOPY N/A 01/18/2024   Procedure: EGD (ESOPHAGOGASTRODUODENOSCOPY);  Surgeon: Jinny Carmine, MD;  Location[facility] ENDOSCOPY;  Service: Endoscopy;  Laterality: N/A;    ESOPHAGOGASTRODUODENOSCOPY (EGD) WITH PROPOFOL  N/A 06/27/2017   Procedure: ESOPHAGOGASTRODUODENOSCOPY (EGD) WITH PROPOFOL ;  Surgeon: Therisa Bi, MD;  Location: [facility] ENDOSCOPY;  Service: Gastroenterology;  Laterality: N/A;    Medical History: Past Medical History:  Diagnosis Date   Diabetes mellitus without complication (HCC)    niddm   Stroke (HCC)     Family History: Family History  Problem Relation Age of Onset   Heart disease Mother    Diabetes Mother    Arthritis Mother    Cancer Mother    Breast cancer Mother 44   Heart disease Father    Stroke Father    Diabetes Father     Social History   Socioeconomic History   Marital status: Single    Spouse name: Not on file  Number of children: Not on file   Years of education: Not on file   Highest education level: Not on file  Occupational History   Not on file  Tobacco Use   Smoking status: Former    Current packs/day: 0.00    Types: Cigarettes    Quit date: 2008    Years since quitting: 18.0   Smokeless tobacco: Never  Substance and Sexual Activity   Alcohol use: No   Drug use: No   Sexual activity: Not Currently  Other Topics Concern   Not on file  Social History Narrative   Not on file   Social Drivers of Health   Tobacco Use: Medium Risk (02/27/2024)   Patient History    Smoking Tobacco Use: Former    Smokeless Tobacco Use: Never    Passive Exposure: Not on Actuary Strain: Not on file  Food Insecurity: No Food Insecurity (01/15/2024)   Epic    Worried About Programme Researcher, Broadcasting/film/video in the Last Year: Never true    Ran Out of Food in the Last Year: Never true  Transportation Needs: No Transportation Needs (01/15/2024)   Epic    Lack of Transportation (Medical): No    Lack of Transportation (Non-Medical): No  Physical Activity: Not on file  Stress: Not on file  Social Connections: Moderately Isolated (01/15/2024)   Social Connection and Isolation Panel    Frequency of Communication with  Friends and Family: More than three times a week    Frequency of Social Gatherings with Friends and Family: More than three times a week    Attends Religious Services: 1 to 4 times per year    Active Member of [employer] or Organizations: No    Attends Banker Meetings: Never    Marital Status: Divorced  Catering Manager Violence: Not At Risk (01/15/2024)   Epic    Fear of Current or Ex-Partner: No    Emotionally Abused: No    Physically Abused: No    Sexually Abused: No  Depression (PHQ2-9): Low Risk (02/27/2024)   Depression (PHQ2-9)    PHQ-2 Score: 0  Alcohol Screen: Not on file  Housing: Low Risk (01/15/2024)   Epic    Unable to Pay for Housing in the Last Year: No    Number of Times Moved in the Last Year: 0    Homeless in the Last Year: No  Utilities: Not At Risk (01/15/2024)   Epic    Threatened with loss of utilities: No  Health Literacy: Not on file      Review of Systems  Constitutional:  Positive for appetite change and fatigue.  HENT:  Positive for postnasal drip. Negative for rhinorrhea.   Eyes:  Negative for redness.  Respiratory:  Negative for chest tightness and shortness of breath.   Cardiovascular:  Negative for chest pain and palpitations.  Gastrointestinal:  Positive for abdominal pain, nausea and vomiting.  Musculoskeletal:  Positive for arthralgias, gait problem and myalgias.  Neurological:  Positive for weakness and numbness.  Hematological:  Negative for adenopathy. Does not bruise/bleed easily.  Psychiatric/Behavioral:  Positive for dysphoric mood and sleep disturbance. Negative for behavioral problems (Depression) and suicidal ideas. The patient is not nervous/anxious.     Vital Signs: BP 110/60   Pulse 83   Temp 98 F (36.7 C)   Resp 16   Ht 4' 11 (1.499 m)   Wt 118 lb 12.8 oz (53.9 kg)   SpO2 100%   BMI 23.99  kg/m    Physical Exam Vitals and nursing note reviewed.  Constitutional:      General: She is not in acute  distress.    Appearance: She is well-developed.  HENT:     Head: Normocephalic and atraumatic.  Eyes:     Extraocular Movements: Extraocular movements intact.  Neck:     Thyroid: No thyromegaly.     Vascular: No JVD.     Trachea: No tracheal deviation.  Cardiovascular:     Rate and Rhythm: Normal rate and regular rhythm.     Heart sounds: Normal heart sounds. No murmur heard.    No friction rub. No gallop.  Pulmonary:     Effort: Pulmonary effort is normal. No respiratory distress.     Breath sounds: No wheezing or rales.  Chest:     Chest wall: No tenderness.  Abdominal:     Tenderness: There is no abdominal tenderness.  Skin:    General: Skin is warm and dry.  Neurological:     Mental Status: She is alert.  Psychiatric:        Behavior: Behavior normal.        Thought Content: Thought content normal.        Judgment: Judgment normal.        Assessment/Plan: 1. Encounter for Medicare annual examination with abnormal findings (Primary) AWV performed, labs ordered, will hold off on screening tests due to acute concerns and will schedule in future  2. Fibromyalgia Will start cymbalta  to try help pain and mood - DULoxetine  (CYMBALTA ) 20 MG capsule; Take 1 capsule (20 mg total) by mouth daily.  Dispense: 30 capsule; Refill: 3  3. Type 2 diabetes mellitus with peripheral neuropathy (HCC) Reports BG improved on increased insulin , but is no longer on oral DM meds due to intolerance with N/V. Will go ahead and recheck A1c to ensure improvement. Encouraged to retry dexcom for better monitoring and adjustment but declines. Will also discuss neuropathy with neurology at upcoming appt. Gabapentin  dud not help and did not tolerate lyrica  though may have been related to gastroparesis - Comprehensive metabolic panel with GFR - Hgb J8R w/o eAG - CBC w/Diff/Platelet  4. Gastroparesis Likely cause of N/V and abdominal pain limiting PO intake and med tolerance. GI appt in a few  weeks - Comprehensive metabolic panel with GFR - Hgb J8R w/o eAG - CBC w/Diff/Platelet  5. Dysuria - UA/M w/rflx Culture, Routine     General Counseling: Alois verbalizes understanding of the findings of todays visit and agrees with plan of treatment. I have discussed any further diagnostic evaluation that may be needed or ordered today. We also reviewed her medications today. she has been encouraged to call the office with any questions or concerns that should arise related to todays visit.    Orders Placed This Encounter  Procedures   UA/M w/rflx Culture, Routine   Comprehensive metabolic panel with GFR   Hgb J8R w/o eAG   CBC w/Diff/Platelet    Meds ordered this encounter  Medications   DULoxetine  (CYMBALTA ) 20 MG capsule    Sig: Take 1 capsule (20 mg total) by mouth daily.    Dispense:  30 capsule    Refill:  3    Return in about 4 weeks (around 03/26/2024) for DFK.   Total time spent:35 Minutes Time spent includes review of chart, medications, test results, and follow up plan with the patient.   [place] Controlled Substance Database was reviewed by me.  This patient was  seen by Tinnie Pro, PA-C in collaboration with Dr. Sigrid Bathe as a part of collaborative care agreement.  Tinnie Pro, PA-C Internal medicine

## 2024-03-19 NOTE — Progress Notes (Unsigned)
 "   03/20/2024 Meredith Martinez [id number] 1950-03-15  Gastroenterology Office Note    Referring Provider: Kristina Tinnie POUR, PA-C Primary Care Physician:  Kristina Tinnie POUR, PA-C  Primary GI Provider: Celestia Rima, NP; Jinny Carmine, MD    Chief Complaint   Chief Complaint  Patient presents with   New Patient (Initial Visit)    Nausea and vomiting-epigastric pain- episodes of vomiting 3 times weekly-normal BM's no constipation or diarrhea     History of Present Illness   Meredith Martinez is a 74 y.o. female with PMHX of type 2 diabetes, presenting today at the request of McDonough, Lauren K, PA-C due to abdominal pain, gastroparesis, nausea and vomiting.   Patient accompanied by family friend/neighbor. Niece who is primary Cg was on the phone during visit.   Discussed the use of AI scribe software for clinical note transcription with the patient, who gave verbal consent to proceed.  Persistent nausea, vomiting, and epigastric pain have been present since November 2025. Extensive evaluation, including upper endoscopy, CT scan, right upper quadrant ultrasound, and gallbladder assessment, was performed. H. pylori testing was performed. Possible gastroparesis was discussed during a prior hospitalization.  Frequency of vomiting has lessened, 1-3 times weekly, described as random and sometimes postprandial, exacerbated by greasy or fried foods. Avoidance of these foods has led to improvement. Blood glucose fluctuations are associated with symptom recurrence; an episode of hyperglycemia (glucose 290 mg/dL) three weeks ago was accompanied by vomiting, which resolved when glucose normalized.   Current diet consists of watermelon, Propel water, grapes, rice, occasional chicken nuggets, and chicken noodle soup, consumed as small, frequent meals. At least two small cans of Pepsi are consumed daily, sometimes with ice. No coffee, tea, or alcohol. Occasional use of Glucerna for diabetics.    Epigastric pain is constant, worsens after eating (typically delayed), and is severe enough to confine her to bed and disrupt sleep. Burning sensation suggestive of acid reflux is present. Occasional use of over-the-counter antacids provides mild but inconsistent relief. Denies NSAID use.   02/02/2024 and 01/26/2024 Seen at emergency room with upper abdominal pain and vomiting.   01/18/2024 EGD - Normal esophagus.  - Normal stomach. Biopsied. MILD CHRONIC INACTIVE GASTRITIS. NO EVIDENCE OF H. PYLORI ON H&E STAIN  - Normal examined duodenum  01/16/2024 CT angio Abd  1. No evidence for mesenteric ischemia. 2. Questionable small focal peripheral dissection in the infrarenal abdominal aorta. 3. Cholelithiasis with dilated gallbladder. No surrounding inflammation. 4. Colonic diverticulosis. 5. Aortic atherosclerosis.  01/16/2024 HIDA scan 1. The cystic and common bile ducts are patent. 2. Gallbladder ejection fraction within normal limits (47%).  01/15/2024 RUQ ultrasound IMPRESSION: 1. Gallbladder sludge without gallbladder wall thickening, pericholecystic fluid, or sonographic murphy sign. If there is high clinical concern for acute cholecystitis consider further evaluation with follow-up nuclear medicine hepatobiliary scan.  12/25/2023 CT abd/pelvis 1. No acute findings in the abdomen or pelvis. 2. Cholelithiasis with nonspecific hydropic gallbladder and no definite CT findings of acute cholecystitis. 3. Nonobstructive punctate bilateral nephrolithiasis. 4. Tiny fat-containing umbilical hernia.  Past Medical History:  Diagnosis Date   Diabetes mellitus without complication (HCC)    niddm   Stroke [facility])     Past Surgical History:  Procedure Laterality Date   BREAST BIOPSY Right    neg   ESOPHAGOGASTRODUODENOSCOPY N/A 01/18/2024   Procedure: EGD (ESOPHAGOGASTRODUODENOSCOPY);  Surgeon: Jinny Carmine, MD;  Location: [facility] ENDOSCOPY;  Service: Endoscopy;  Laterality: N/A;    ESOPHAGOGASTRODUODENOSCOPY (EGD) WITH PROPOFOL  N/A 06/27/2017  Procedure: ESOPHAGOGASTRODUODENOSCOPY (EGD) WITH PROPOFOL ;  Surgeon: Therisa Bi, MD;  Location: [facility] ENDOSCOPY;  Service: Gastroenterology;  Laterality: N/A;    Current Outpatient Medications  Medication Sig Dispense Refill   Accu-Chek Softclix Lancets lancets Use to check blood glucose two to three times daily 100 each 0   Continuous Glucose Receiver (DEXCOM G7 RECEIVER) DEVI USE AS DIRECTED DX E11.65 1 each 0   Continuous Glucose Sensor (DEXCOM G7 SENSOR) MISC USE AS DIRECTED FOR 10 DAYS 3 each 1   DULoxetine  (CYMBALTA ) 20 MG capsule Take 1 capsule (20 mg total) by mouth daily. 30 capsule 3   feeding supplement, GLUCERNA SHAKE, (GLUCERNA SHAKE) LIQD Take 237 mLs by mouth 2 (two) times daily. 237 mL 0   glucose blood test strip Use to check blood sugar two to three times daily 100 each 0   hydrochlorothiazide  (HYDRODIURIL ) 12.5 MG tablet Take 12.5 mg by mouth daily.     insulin  glargine (LANTUS ) 100 UNIT/ML Solostar Pen Inject 20 Units into the skin at bedtime. 15 mL 3   traMADol  (ULTRAM ) 50 MG tablet Take 1/2-1 tablet by mouth twice daily as needed for pain. 20 tablet 0   No current facility-administered medications for this visit.    Allergies as of 03/20/2024 - Review Complete 03/20/2024  Allergen Reaction Noted   Iodine Other (See Comments) 06/25/2017   Fish allergy Nausea And Vomiting and Other (See Comments) 07/06/2023   Amoxicillin Nausea And Vomiting 06/25/2017   Lyrica  [pregabalin ] Nausea And Vomiting 01/10/2024   Shellfish allergy Other (See Comments) 07/14/2018    Family History  Problem Relation Age of Onset   Heart disease Mother    Diabetes Mother    Arthritis Mother    Cancer Mother    Breast cancer Mother 2   Heart disease Father    Stroke Father    Diabetes Father     Social History   Socioeconomic History   Marital status: Single    Spouse name: Not on file   Number of children: Not on file    Years of education: Not on file   Highest education level: Not on file  Occupational History   Not on file  Tobacco Use   Smoking status: Former    Current packs/day: 0.00    Types: Cigarettes    Quit date: 2008    Years since quitting: 18.0   Smokeless tobacco: Never  Substance and Sexual Activity   Alcohol use: No   Drug use: No   Sexual activity: Not Currently  Other Topics Concern   Not on file  Social History Narrative   Not on file   Social Drivers of Health   Tobacco Use: Medium Risk (03/20/2024)   Patient History    Smoking Tobacco Use: Former    Smokeless Tobacco Use: Never    Passive Exposure: Not on Actuary Strain: Not on file  Food Insecurity: No Food Insecurity (01/15/2024)   Epic    Worried About Programme Researcher, Broadcasting/film/video in the Last Year: Never true    Ran Out of Food in the Last Year: Never true  Transportation Needs: No Transportation Needs (01/15/2024)   Epic    Lack of Transportation (Medical): No    Lack of Transportation (Non-Medical): No  Physical Activity: Not on file  Stress: Not on file  Social Connections: Moderately Isolated (01/15/2024)   Social Connection and Isolation Panel    Frequency of Communication with Friends and Family: More than three times a  week    Frequency of Social Gatherings with Friends and Family: More than three times a week    Attends Religious Services: 1 to 4 times per year    Active Member of Clubs or Organizations: No    Attends Banker Meetings: Never    Marital Status: Divorced  Catering Manager Violence: Not At Risk (01/15/2024)   Epic    Fear of Current or Ex-Partner: No    Emotionally Abused: No    Physically Abused: No    Sexually Abused: No  Depression (PHQ2-9): Low Risk (02/27/2024)   Depression (PHQ2-9)    PHQ-2 Score: 0  Alcohol Screen: Not on file  Housing: Low Risk (01/15/2024)   Epic    Unable to Pay for Housing in the Last Year: No    Number of Times Moved in the Last  Year: 0    Homeless in the Last Year: No  Utilities: Not At Risk (01/15/2024)   Epic    Threatened with loss of utilities: No  Health Literacy: Not on file     RELEVANT GI HISTORY, IMAGING AND LABS: CBC    Component Value Date/Time   WBC 6.9 01/26/2024 1558   RBC 4.70 01/26/2024 1558   HGB 14.2 01/26/2024 1558   HGB 14.0 01/13/2024 1321   HCT 44.9 01/26/2024 1558   HCT 42.2 01/13/2024 1321   PLT 363 01/26/2024 1558   PLT 311 01/13/2024 1321   MCV 95.5 01/26/2024 1558   MCV 91 01/13/2024 1321   MCH 30.2 01/26/2024 1558   MCHC 31.6 01/26/2024 1558   RDW 13.4 01/26/2024 1558   RDW 12.5 01/13/2024 1321   LYMPHSABS 1.9 01/13/2024 1321   MONOABS 0.6 06/29/2023 1732   EOSABS 0.1 01/13/2024 1321   BASOSABS 0.1 01/13/2024 1321   Recent Labs    06/29/23 1732 06/30/23 0505 07/15/23 1032 12/25/23 1806 01/13/24 1321 01/15/24 0715 01/16/24 0428 01/17/24 0440 01/18/24 0350 01/26/24 1558  HGB 13.9 13.4 12.3 14.9 14.0 13.9 12.6 12.8 11.4* 14.2    CMP     Component Value Date/Time   NA 134 (L) 01/26/2024 1558   NA 137 01/13/2024 1321   K 4.1 01/26/2024 1558   CL 94 (L) 01/26/2024 1558   CO2 27 01/26/2024 1558   GLUCOSE 387 (H) 01/26/2024 1558   BUN 19 01/26/2024 1558   BUN 14 01/13/2024 1321   CREATININE 0.85 01/26/2024 1558   CALCIUM  10.3 01/26/2024 1558   PROT 8.0 01/26/2024 1558   PROT 7.6 01/13/2024 1321   ALBUMIN 4.4 01/26/2024 1558   ALBUMIN 4.3 01/13/2024 1321   AST 32 01/26/2024 1558   ALT 32 01/26/2024 1558   ALKPHOS 116 01/26/2024 1558   BILITOT 0.6 01/26/2024 1558   BILITOT 0.6 01/13/2024 1321   GFRNONAA >60 01/26/2024 1558   GFRAA >60 11/04/2018 2035      Latest Ref Rng & Units 01/26/2024    3:58 PM 01/16/2024    4:28 AM 01/15/2024    7:15 AM  Hepatic Function  Total Protein 6.5 - 8.1 g/dL 8.0  7.2  8.4   Albumin 3.5 - 5.0 g/dL 4.4  4.0  4.5   AST 15 - 41 U/L 32  29  33   ALT 0 - 44 U/L 32  20  20   Alk Phosphatase 38 - 126 U/L 116  91  109    Total Bilirubin 0.0 - 1.2 mg/dL 0.6  0.6  0.8       Review of  Systems   All systems reviewed and negative except where noted in HPI.    Physical Exam  BP 119/77   Pulse 87   Temp (!) 97.4 F (36.3 C)   Wt 116 lb 12.8 oz (53 kg)   SpO2 97%   BMI 23.59 kg/m  No LMP recorded. Patient is postmenopausal. General:   Alert and oriented. Pleasant and cooperative. Well-nourished and well-developed. In no acute distress.  Head:  Normocephalic and atraumatic. Eyes:  Without icterus Ears:  Normal auditory acuity. Lungs:  Respirations even and unlabored.  Clear throughout to auscultation.   No wheezes, crackles, or rhonchi. No acute distress. Heart:  Regular rate and rhythm; no murmurs, clicks, rubs, or gallops. Abdomen:  Normal bowel sounds.  No bruits.  Mild TTP epigastric area, soft, non-tender and non-distended without masses, hepatosplenomegaly or hernias noted.  No guarding or rebound tenderness.  Rectal:  Deferred. Msk:  Symmetrical without gross deformities. Normal posture. Extremities:  Without edema. Neurologic:  Alert and  oriented x4;  grossly normal neurologically. Skin:  Intact without significant lesions or rashes. Psych:  Alert and cooperative. Normal mood and affect.   Assessment & Plan   Karey Suthers is a 74 y.o. female presenting today with epigastric pain, nausea, and vomiting.   Assessment and Plan  Gerd, chronic nausea, vomiting, and epigastric pain Persistent symptoms with partial improvement from dietary changes. Prior workup unrevealing. - start pantoprazole  30 minutes before breakfast. - Initiated famotidine  at bedtime. - Advised continuation of small, frequent meals. - Recommended reduction of soda intake and increased water consumption. Avoidance of spicy, greasy, and fried foods. - Recommended use of a wedge pillow and left lateral decubitus positioning at night. - Suspected gastroparesis in setting of diabetes.  Persistent symptoms despite  normal endoscopic and imaging findings. Ordered gastric emptying study.   Follow up in 1 month.   Grayce Bohr, DNP, AGNP-C [facility] Gastroenterology  "

## 2024-03-20 ENCOUNTER — Encounter: Payer: Self-pay | Admitting: Family Medicine

## 2024-03-20 ENCOUNTER — Other Ambulatory Visit: Payer: Self-pay | Admitting: Physician Assistant

## 2024-03-20 ENCOUNTER — Ambulatory Visit: Admitting: Family Medicine

## 2024-03-20 VITALS — BP 119/77 | HR 87 | Temp 97.4°F | Wt 116.8 lb

## 2024-03-20 DIAGNOSIS — K219 Gastro-esophageal reflux disease without esophagitis: Secondary | ICD-10-CM

## 2024-03-20 DIAGNOSIS — R1013 Epigastric pain: Secondary | ICD-10-CM

## 2024-03-20 DIAGNOSIS — R112 Nausea with vomiting, unspecified: Secondary | ICD-10-CM | POA: Diagnosis not present

## 2024-03-20 MED ORDER — FAMOTIDINE 20 MG PO TABS: 20.0000 mg | ORAL_TABLET | Freq: Every day | ORAL | 2 refills | Status: AC

## 2024-03-20 MED ORDER — PANTOPRAZOLE SODIUM 40 MG PO TBEC: 40.0000 mg | DELAYED_RELEASE_TABLET | Freq: Every day | ORAL | 0 refills | Status: AC

## 2024-03-20 NOTE — Patient Instructions (Signed)
 Gastric study @ [facility]- arrive 03-30-24 @ 9:30 am.

## 2024-03-22 ENCOUNTER — Telehealth: Payer: Self-pay

## 2024-03-22 NOTE — Telephone Encounter (Signed)
 Spoke with patient's niece to let her know that prescriptions are ready at the pharmacy.

## 2024-03-22 NOTE — Telephone Encounter (Signed)
 Per pt niece [phone] Geni pt was supposed to get two prescription but unaware of what they were supposed to be. Pt niece states there nothing at the pharmacy.

## 2024-03-26 ENCOUNTER — Ambulatory Visit: Admitting: Internal Medicine

## 2024-03-27 ENCOUNTER — Telehealth: Payer: Self-pay | Admitting: Internal Medicine

## 2024-03-27 NOTE — Telephone Encounter (Signed)
 Attempted to contact patient to reschedule 03/26/24 missed appointment due to weather. No answer. No vm-Toni

## 2024-03-30 ENCOUNTER — Encounter: Admission: RE | Admit: 2024-03-30

## 2024-03-30 DIAGNOSIS — R112 Nausea with vomiting, unspecified: Secondary | ICD-10-CM

## 2024-03-30 MED ORDER — TECHNETIUM TC 99M SULFUR COLLOID
2.0000 | Freq: Once | INTRAVENOUS | Status: AC | PRN
  Administered 2024-03-30: 2.23 via ORAL

## 2024-04-03 ENCOUNTER — Ambulatory Visit: Admitting: Internal Medicine

## 2024-04-17 ENCOUNTER — Ambulatory Visit

## 2025-02-28 ENCOUNTER — Ambulatory Visit: Admitting: Physician Assistant
